# Patient Record
Sex: Male | Born: 1945 | Race: White | Hispanic: No | Marital: Married | Smoking: Never smoker
Health system: Southern US, Community
[De-identification: ages and names within clinical notes are randomized; demographics above are authoritative.]

## PROBLEM LIST (undated history)

## (undated) DIAGNOSIS — K635 Polyp of colon: Secondary | ICD-10-CM

## (undated) DIAGNOSIS — I739 Peripheral vascular disease, unspecified: Secondary | ICD-10-CM

## (undated) DIAGNOSIS — E119 Type 2 diabetes mellitus without complications: Secondary | ICD-10-CM

## (undated) DIAGNOSIS — T7840XA Allergy, unspecified, initial encounter: Secondary | ICD-10-CM

## (undated) DIAGNOSIS — R630 Anorexia: Secondary | ICD-10-CM

## (undated) DIAGNOSIS — G8194 Hemiplegia, unspecified affecting left nondominant side: Secondary | ICD-10-CM

## (undated) DIAGNOSIS — I10 Essential (primary) hypertension: Secondary | ICD-10-CM

## (undated) DIAGNOSIS — I2510 Atherosclerotic heart disease of native coronary artery without angina pectoris: Secondary | ICD-10-CM

## (undated) DIAGNOSIS — I639 Cerebral infarction, unspecified: Secondary | ICD-10-CM

## (undated) DIAGNOSIS — E11319 Type 2 diabetes mellitus with unspecified diabetic retinopathy without macular edema: Secondary | ICD-10-CM

## (undated) DIAGNOSIS — K219 Gastro-esophageal reflux disease without esophagitis: Secondary | ICD-10-CM

## (undated) DIAGNOSIS — E785 Hyperlipidemia, unspecified: Secondary | ICD-10-CM

## (undated) HISTORY — DX: Hyperlipidemia, unspecified: E78.5

## (undated) HISTORY — DX: Cerebral infarction, unspecified: I63.9

## (undated) HISTORY — DX: Polyp of colon: K63.5

## (undated) HISTORY — DX: Gastro-esophageal reflux disease without esophagitis: K21.9

## (undated) HISTORY — DX: Hemiplegia, unspecified affecting left nondominant side: G81.94

## (undated) HISTORY — DX: Anorexia: R63.0

## (undated) HISTORY — DX: Atherosclerotic heart disease of native coronary artery without angina pectoris: I25.10

## (undated) HISTORY — DX: Allergy, unspecified, initial encounter: T78.40XA

## (undated) HISTORY — DX: Peripheral vascular disease, unspecified: I73.9

## (undated) HISTORY — DX: Essential (primary) hypertension: I10

## (undated) HISTORY — PX: OTHER SURGICAL HISTORY: SHX169

## (undated) HISTORY — DX: Type 2 diabetes mellitus with unspecified diabetic retinopathy without macular edema: E11.319

## (undated) HISTORY — DX: Type 2 diabetes mellitus without complications: E11.9

---

## 1999-07-25 HISTORY — PX: OTHER SURGICAL HISTORY: SHX169

## 2007-10-22 ENCOUNTER — Ambulatory Visit (HOSPITAL_COMMUNITY): Admission: RE | Admit: 2007-10-22 | Discharge: 2007-10-22 | Payer: Self-pay | Admitting: Internal Medicine

## 2012-10-24 ENCOUNTER — Non-Acute Institutional Stay: Payer: BC Managed Care – PPO | Admitting: Internal Medicine

## 2012-10-24 DIAGNOSIS — I6990 Unspecified sequelae of unspecified cerebrovascular disease: Secondary | ICD-10-CM

## 2012-10-24 DIAGNOSIS — E7800 Pure hypercholesterolemia, unspecified: Secondary | ICD-10-CM

## 2012-10-24 DIAGNOSIS — I10 Essential (primary) hypertension: Secondary | ICD-10-CM

## 2012-10-24 DIAGNOSIS — E1059 Type 1 diabetes mellitus with other circulatory complications: Secondary | ICD-10-CM

## 2012-10-26 NOTE — Progress Notes (Signed)
PROGRESS NOTE  DATE: 10/24/12  FACILITY: [facility]  LEVEL OF CARE: SNF  Routine Visit  CHIEF COMPLAINT:  Manage diabetes mellitus and hyperlipidemia  HISTORY OF PRESENT ILLNESS:  REASSESSMENT OF ONGOING PROBLEM(S):  1. DM:pt's DM remains stable.  Pt denies polyuria, polydipsia, polyphagia, changes in vision or hypoglycemic episodes.  No complications noted from the medication presently being used.  Last hemoglobin A1c is: 6.7 in 11/13.  2. HYPERLIPIDEMIA: No complications from the medications presently being used. Last fasting lipid panel showed : 11/13 fasting lipid panel normal.  PAST MEDICAL HISTORY : Reviewed.  No changes.  CURRENT MEDICATIONS: Reviewed per [facility]  REVIEW OF SYSTEMS:  GENERAL: no change in appetite, no fatigue, no weight changes, no fever, chills or weakness RESPIRATORY: no cough, SOB, DOE, wheezing, hemoptysis CARDIAC: no chest pain, edema or palpitations GI: no abdominal pain, diarrhea, constipation, heart burn, nausea or vomiting  PHYSICAL EXAMINATION  VS:  T 98.7       P 77      RR 20      BP 116/72     POX %     WT (Lb) 136.8  GENERAL: no acute distress, normal body habitus NECK: supple, trachea midline, no neck masses, no thyroid tenderness, no thyromegaly RESPIRATORY: breathing is even & unlabored, BS CTAB CARDIAC: RRR, no murmur,no extra heart sounds, no edema GI: abdomen soft, normal BS, no masses, no tenderness, no hepatomegaly, no splenomegaly PSYCHIATRIC: the patient is alert & oriented to person, affect & behavior appropriate  LABS/RADIOLOGY:  3/14 hemoglobin 11.4, MCV 83.1 otherwise CBC normal, glucose 123 otherwise BMP normal 11/13 total protein 5.8, albumin 3.1, alkaline phosphatase 119 otherwise he will profile normal  ASSESSMENT/PLAN:  1. diabetes mellitus-well controlled. 2. hyperlipidemia-well controlled. 3. hypertension-well-controlled. 4. history of CVA-stable. 5.  Insomnia-denies ongoing symptoms. 6. vascular  dementia-stable.  CPT CODE: 40981

## 2012-10-31 ENCOUNTER — Non-Acute Institutional Stay: Payer: BC Managed Care – PPO | Admitting: Internal Medicine

## 2012-10-31 DIAGNOSIS — F329 Major depressive disorder, single episode, unspecified: Secondary | ICD-10-CM

## 2012-11-11 ENCOUNTER — Non-Acute Institutional Stay: Payer: BC Managed Care – PPO | Admitting: Adult Health

## 2012-11-11 ENCOUNTER — Encounter: Payer: Self-pay | Admitting: Adult Health

## 2012-11-11 DIAGNOSIS — I6350 Cerebral infarction due to unspecified occlusion or stenosis of unspecified cerebral artery: Secondary | ICD-10-CM

## 2012-11-11 DIAGNOSIS — I10 Essential (primary) hypertension: Secondary | ICD-10-CM

## 2012-11-11 DIAGNOSIS — K219 Gastro-esophageal reflux disease without esophagitis: Secondary | ICD-10-CM | POA: Insufficient documentation

## 2012-11-11 DIAGNOSIS — I639 Cerebral infarction, unspecified: Secondary | ICD-10-CM | POA: Insufficient documentation

## 2012-11-11 DIAGNOSIS — R634 Abnormal weight loss: Secondary | ICD-10-CM

## 2012-11-11 DIAGNOSIS — H8110 Benign paroxysmal vertigo, unspecified ear: Secondary | ICD-10-CM | POA: Insufficient documentation

## 2012-11-11 DIAGNOSIS — E118 Type 2 diabetes mellitus with unspecified complications: Secondary | ICD-10-CM | POA: Insufficient documentation

## 2012-11-11 DIAGNOSIS — G4700 Insomnia, unspecified: Secondary | ICD-10-CM | POA: Insufficient documentation

## 2012-11-11 DIAGNOSIS — E785 Hyperlipidemia, unspecified: Secondary | ICD-10-CM

## 2012-11-11 DIAGNOSIS — K5900 Constipation, unspecified: Secondary | ICD-10-CM | POA: Insufficient documentation

## 2012-11-11 MED ORDER — MEGESTROL ACETATE 40 MG/ML PO SUSP: 400.0000 mg | Freq: Every day | ORAL | Status: DC

## 2012-11-11 NOTE — Progress Notes (Signed)
Patient ID: Jeremiah Baldwin, male   DOB: Sep 16, 1945, 67 y.o.   MRN: [id number]  Chief Complaint  Patient presents with  . Acute Visit    weight loss    HPI:  GERD (gastroesophageal reflux disease) Is stable is taking protonix 40 mg daily   Essential hypertension, benign Is stable is taking vasotec 5 mg daily   Insomnia Is stable is taking trazodone 75 mg nightly   Other and unspecified hyperlipidemia Is stable is taking lipitor 10 mg daily   CVA (cerebral vascular accident) Without change in status is taking plavix 75 mg daily   Unspecified constipation Is taking colace 200 mg three times daily   Benign paroxysmal positional vertigo Is stable is taking meclizine 25 mg three times daily   Loss of weight His appetite remains poor his remeron was reduced to 7.5 mg nightly at this time his weight is at 134 pounds down from 140 pounds. He does not participate in the hpi or ros at times and at times will decline weights.    Past Medical History  Diagnosis Date  . Allergy   . Anorexia   . Hypertension   . Hyperlipidemia   . Diabetes mellitus without complication   . CAD (coronary artery disease)   . Colon polyp   . Stroke   . Left hemiparesis   . GERD (gastroesophageal reflux disease)   . PVD (peripheral vascular disease)   . Diabetic retinopathy     Past Surgical History  Procedure Laterality Date  . Left bka    . Enteroscopic laser photocoagulation Left 2001    VITAL SIGNS BP 116/72  Pulse 60  Ht 6\' 1"  (1.854 m)  Wt 134 lb (60.782 kg)  BMI 17.68 kg/m2   Patient's Medications  New Prescriptions   No medications on file  Previous Medications   ATORVASTATIN (LIPITOR) 10 MG TABLET    Take 10 mg by mouth daily.   CLOPIDOGREL (PLAVIX) 75 MG TABLET    Take 75 mg by mouth daily.   DOCUSATE SODIUM (COLACE) 100 MG CAPSULE    Take 300 mg by mouth 3 (three) times daily.   ENALAPRIL (VASOTEC) 5 MG TABLET    Take 5 mg by mouth daily.   HYDROCODONE-ACETAMINOPHEN  (NORCO/VICODIN) 5-325 MG PER TABLET    Take 1 tablet by mouth every 4 (four) hours as needed for pain.   INSULIN DETEMIR (LEVEMIR) 100 UNIT/ML INJECTION    Inject 10 Units into the skin daily.   LORAZEPAM (ATIVAN) 0.5 MG TABLET    Take 0.5 mg by mouth daily as needed for anxiety (prior to dental appointments).   MECLIZINE (ANTIVERT) 25 MG TABLET    Take 25 mg by mouth 3 (three) times daily.   MIRTAZAPINE (REMERON) 7.5 MG TABLET    Take 7.5 mg by mouth at bedtime.   OLOPATADINE HCL (PATADAY) 0.2 % SOLN    Apply 1 drop to eye daily. Each eye   PANTOPRAZOLE (PROTONIX) 40 MG TABLET    Take 40 mg by mouth daily.   TRAZODONE (DESYREL) 50 MG TABLET    Take 75 mg by mouth at bedtime.  Modified Medications   No medications on file  Discontinued Medications   No medications on file    SIGNIFICANT DIAGNOSTIC EXAMS   LAB REVIEWED: 06-07-12: wbc 8.8;hgb 12.4; hct 35.5; mcv 82.8; plt 234; glucose 134; bun 14; creat 0.55; k+ 3.4 Na++ 138; liver normal albumin 3.1; chol 108; ldl 67; trig 62  06-14-12: hgb a1c 6.7  10-11-12: wbc 6.7; hgb 11.4; hct 34.0; mcv 83.1; plt 25; glucose 123; bun 27; creat 12; k+ 4.1 Na++140  Review of Systems  Unable to perform ROS    Physical Exam  Constitutional:  Thin   Neck: Neck supple.  Cardiovascular: Normal rate and regular rhythm.   +right pp  Respiratory: Effort normal and breath sounds normal.  GI: Soft. Bowel sounds are normal.  Musculoskeletal: Normal range of motion.  Left bka  Neurological: He is alert.  Skin: Skin is warm and dry.       ASSESSMENT/ PLAN:  Will stop the lipitor at this time due to his change in appetite; will begin megace 400 mg daily prior to lunch for 30 days and will continue to monitor his status

## 2012-11-11 NOTE — Assessment & Plan Note (Signed)
His appetite remains poor his remeron was reduced to 7.5 mg nightly at this time his weight is at 134 pounds down from 140 pounds. He does not participate in the hpi or ros at times and at times will decline weights.

## 2012-11-11 NOTE — Assessment & Plan Note (Signed)
Is stable is taking vasotec 5 mg daily

## 2012-11-11 NOTE — Assessment & Plan Note (Signed)
Is taking colace 200 mg three times daily

## 2012-11-11 NOTE — Assessment & Plan Note (Signed)
Without change in status is taking plavix 75 mg daily

## 2012-11-11 NOTE — Assessment & Plan Note (Signed)
Is stable is taking trazodone 75 mg nightly

## 2012-11-11 NOTE — Assessment & Plan Note (Signed)
Is stable is taking meclizine 25 mg three times daily

## 2012-11-11 NOTE — Assessment & Plan Note (Signed)
Is stable is taking protonix 40 mg daily

## 2012-11-11 NOTE — Assessment & Plan Note (Signed)
Is stable is taking lipitor 10 mg daily

## 2012-11-19 NOTE — Progress Notes (Signed)
Patient ID: Jeremiah Baldwin, male   DOB: March 08, 1946, 67 y.o.   MRN: [id number]        PROGRESS NOTE  DATE:  10/31/2012  FACILITY: [facility]   LEVEL OF CARE: SNF  Acute Visit  CHIEF COMPLAINT:  Manage depression.  HISTORY OF PRESENT ILLNESS: I was requested by the staff to assess the patient regarding above problem(s):  DEPRESSION: The depression remains stable. Patient denies ongoing feelings of sadness, insomnia, anedhonia or lack of appetite. No complications reported from the medications currently being used. Staff do not report behavioral problems.  I was requested by the pharmacy consultant to assess the patient for possible dose reduction of Remeron.  Patient denies ongoing depressive symptoms.    PAST MEDICAL HISTORY : Reviewed.  No changes.  CURRENT MEDICATIONS: Reviewed per [facility]  PHYSICAL EXAMINATION  GENERAL: no acute distress, normal body habitus RESPIRATORY: breathing is even & unlabored, BS CTAB CARDIAC: RRR, no murmur,no extra heart sounds, no edema PSYCHIATRIC: the patient is alert & oriented to person, affect & behavior appropriate  ASSESSMENT/PLAN:  Depression.  Stable problem.  Decrease Remeron to 7.5 mg q.h.s. and observe.    CPT CODE: 78469

## 2012-11-28 ENCOUNTER — Non-Acute Institutional Stay: Payer: BC Managed Care – PPO | Admitting: Internal Medicine

## 2012-11-28 DIAGNOSIS — I10 Essential (primary) hypertension: Secondary | ICD-10-CM

## 2012-11-28 DIAGNOSIS — E1059 Type 1 diabetes mellitus with other circulatory complications: Secondary | ICD-10-CM

## 2012-11-28 DIAGNOSIS — I6990 Unspecified sequelae of unspecified cerebrovascular disease: Secondary | ICD-10-CM

## 2012-11-28 DIAGNOSIS — E7800 Pure hypercholesterolemia, unspecified: Secondary | ICD-10-CM

## 2012-11-29 DIAGNOSIS — E7800 Pure hypercholesterolemia, unspecified: Secondary | ICD-10-CM | POA: Insufficient documentation

## 2012-11-29 DIAGNOSIS — I69959 Hemiplegia and hemiparesis following unspecified cerebrovascular disease affecting unspecified side: Secondary | ICD-10-CM | POA: Insufficient documentation

## 2012-11-29 DIAGNOSIS — E1051 Type 1 diabetes mellitus with diabetic peripheral angiopathy without gangrene: Secondary | ICD-10-CM | POA: Insufficient documentation

## 2012-11-29 NOTE — Progress Notes (Signed)
PROGRESS NOTE  DATE: 11/28/12  FACILITY: [facility]  LEVEL OF CARE: SNF  Routine Visit  CHIEF COMPLAINT:  Manage diabetes mellitus and hyperlipidemia  HISTORY OF PRESENT ILLNESS:  REASSESSMENT OF ONGOING PROBLEM(S):  1. DM:pt's DM remains stable.  Pt denies polyuria, polydipsia, polyphagia, changes in vision or hypoglycemic episodes.  No complications noted from the medication presently being used.  Last hemoglobin A1c is: 6.7 in 11/13.  2. HYPERLIPIDEMIA: No complications from the medications presently being used. Last fasting lipid panel showed : 11/13 fasting lipid panel normal.  PAST MEDICAL HISTORY : Reviewed.  No changes.  CURRENT MEDICATIONS: Reviewed per [facility]  REVIEW OF SYSTEMS:  GENERAL: no change in appetite, no fatigue, no weight changes, no fever, chills or weakness RESPIRATORY: no cough, SOB, DOE, wheezing, hemoptysis CARDIAC: no chest pain, edema or palpitations GI: no abdominal pain, diarrhea, constipation, heart burn, nausea or vomiting  PHYSICAL EXAMINATION  VS:  T 98       P 87      RR 17     BP 116/74     POX %     WT (Lb) 134  GENERAL: no acute distress, normal body habitus NECK: supple, trachea midline, no neck masses, no thyroid tenderness, no thyromegaly RESPIRATORY: breathing is even & unlabored, BS CTAB CARDIAC: RRR, no murmur,no extra heart sounds, no edema GI: abdomen soft, normal BS, no masses, no tenderness, no hepatomegaly, no splenomegaly PSYCHIATRIC: the patient is alert & oriented to person, affect & behavior appropriate  LABS/RADIOLOGY:  3/14 hemoglobin 11.4, MCV 83.1 otherwise CBC normal, glucose 123 otherwise BMP normal 11/13 total protein 5.8, albumin 3.1, alkaline phosphatase 119 otherwise he will profile normal  ASSESSMENT/PLAN:  1. diabetes mellitus-well controlled. Check hemoglobin A1c. 2. hyperlipidemia-well controlled. Check fasting lipid panel. 3. hypertension-well-controlled. 4. history of CVA-stable. 5.   Insomnia-denies ongoing symptoms. 6. vascular dementia-stable. 7. check liver profile.  CPT CODE: 40981

## 2012-12-24 ENCOUNTER — Non-Acute Institutional Stay: Payer: BC Managed Care – PPO | Admitting: Internal Medicine

## 2012-12-24 DIAGNOSIS — I639 Cerebral infarction, unspecified: Secondary | ICD-10-CM

## 2012-12-24 DIAGNOSIS — I10 Essential (primary) hypertension: Secondary | ICD-10-CM

## 2012-12-24 DIAGNOSIS — E1059 Type 1 diabetes mellitus with other circulatory complications: Secondary | ICD-10-CM

## 2012-12-24 DIAGNOSIS — I6350 Cerebral infarction due to unspecified occlusion or stenosis of unspecified cerebral artery: Secondary | ICD-10-CM

## 2012-12-24 DIAGNOSIS — E7800 Pure hypercholesterolemia, unspecified: Secondary | ICD-10-CM

## 2012-12-26 NOTE — Progress Notes (Signed)
PROGRESS NOTE  DATE: 12/24/12  FACILITY: [facility]  LEVEL OF CARE: SNF  Routine Visit  CHIEF COMPLAINT:  Manage diabetes mellitus and hyperlipidemia  HISTORY OF PRESENT ILLNESS:  REASSESSMENT OF ONGOING PROBLEM(S):  1. DM:pt's DM remains stable.  Pt denies polyuria, polydipsia, polyphagia, changes in vision or hypoglycemic episodes.  No complications noted from the medication presently being used.  Last hemoglobin A1c is: 6.7 in 11/13, in 5/14 hemoglobin A1c 6.2.  2. HYPERLIPIDEMIA: No complications from the medications presently being used. Last fasting lipid panel showed : 11/13 fasting lipid panel normal, in 5/14 HDL 27 otherwise fasting lipid panel normal.  PAST MEDICAL HISTORY : Reviewed.  No changes.  CURRENT MEDICATIONS: Reviewed per [facility]  REVIEW OF SYSTEMS:  GENERAL: no change in appetite, no fatigue, no weight changes, no fever, chills or weakness RESPIRATORY: no cough, SOB, DOE, wheezing, hemoptysis CARDIAC: no chest pain, edema or palpitations GI: no abdominal pain, diarrhea, constipation, heart burn, nausea or vomiting  PHYSICAL EXAMINATION  VS:  T 98.2       P 82      RR 18     BP 116/74     POX %     WT (Lb) 141  GENERAL: no acute distress, thin body habitus NECK: supple, trachea midline, no neck masses, no thyroid tenderness, no thyromegaly RESPIRATORY: breathing is even & unlabored, BS CTAB CARDIAC: RRR, no murmur,no extra heart sounds, no edema GI: abdomen soft, normal BS, no masses, no tenderness, no hepatomegaly, no splenomegaly PSYCHIATRIC: the patient is alert & oriented to person, affect & behavior appropriate  LABS/RADIOLOGY:  5/14 albumin 3.4, total protein 5.8 otherwise liver profile normal  3/14 hemoglobin 11.4, MCV 83.1 otherwise CBC normal, glucose 123 otherwise BMP normal 11/13 total protein 5.8, albumin 3.1, alkaline phosphatase 119 otherwise he will profile normal  ASSESSMENT/PLAN:  1. diabetes mellitus-well controlled.  2.  hyperlipidemia-well controlled.  3. hypertension-well-controlled. 4. history of CVA-stable. 5.  Insomnia-denies ongoing symptoms. 6. vascular dementia-stable.  CPT CODE: 16109

## 2013-01-28 ENCOUNTER — Non-Acute Institutional Stay: Payer: BC Managed Care – PPO | Admitting: Internal Medicine

## 2013-01-28 DIAGNOSIS — R339 Retention of urine, unspecified: Secondary | ICD-10-CM

## 2013-01-28 DIAGNOSIS — G4700 Insomnia, unspecified: Secondary | ICD-10-CM

## 2013-01-28 DIAGNOSIS — R140 Abdominal distension (gaseous): Secondary | ICD-10-CM

## 2013-01-28 DIAGNOSIS — R141 Gas pain: Secondary | ICD-10-CM

## 2013-02-04 ENCOUNTER — Non-Acute Institutional Stay: Payer: BC Managed Care – PPO | Admitting: Internal Medicine

## 2013-02-04 DIAGNOSIS — N390 Urinary tract infection, site not specified: Secondary | ICD-10-CM

## 2013-02-05 ENCOUNTER — Non-Acute Institutional Stay: Payer: BC Managed Care – PPO | Admitting: Nurse Practitioner

## 2013-02-05 DIAGNOSIS — N390 Urinary tract infection, site not specified: Secondary | ICD-10-CM

## 2013-02-05 DIAGNOSIS — I639 Cerebral infarction, unspecified: Secondary | ICD-10-CM

## 2013-02-05 DIAGNOSIS — I6350 Cerebral infarction due to unspecified occlusion or stenosis of unspecified cerebral artery: Secondary | ICD-10-CM

## 2013-02-05 DIAGNOSIS — R634 Abnormal weight loss: Secondary | ICD-10-CM

## 2013-02-05 DIAGNOSIS — I10 Essential (primary) hypertension: Secondary | ICD-10-CM

## 2013-02-05 DIAGNOSIS — K5900 Constipation, unspecified: Secondary | ICD-10-CM

## 2013-02-05 NOTE — Progress Notes (Signed)
Patient ID: Jeremiah Baldwin, male   DOB: [birth year], 67 y.o.   MRN: [id number]  Nursing Home Location:  [facility]   Place of Service: SNF [phone])  Chief Complaint  Patient presents with  . Medical Managment of Chronic Issues    HPI:  67 year old male who is a long term resident of [facility] is seen today for routine follow up Pt with a PMH of GERD, HTN, Insomnia, hyperlipids, CVA, constipation, and weight loss Review of ongoing issues:  GERD (gastroesophageal reflux disease)  taking protonix 40 mg daily - stable  Essential hypertension, benign  taking vasotec 5 mg daily - stable Insomnia  taking trazodone 75 mg nightly - stable Other and unspecified hyperlipidemia  Currently taking lipitor 10 mg daily CVA (cerebral vascular accident)  Without change in status is taking plavix 75 mg daily  Unspecified constipation  Is taking colace 200 mg three times daily - without complaints of constipation Loss of weight  His appetite remains poor--  his remeron was reduced to 7.5 mg nightly at this time his weight is at 143 lbs   Pt with recent UTI- started on ampicillin for 1 week on day 2/7 - tolerating medication without side effects. Nursing without concerns at this time.  Review of Systems:  Limited ROS due to pt not willing to answer questions except with minimal communication Review of Systems  Respiratory: Negative for shortness of breath.   Cardiovascular: Negative for chest pain and palpitations.  Gastrointestinal: Negative for abdominal pain, diarrhea and constipation.  Genitourinary: Negative for dysuria.  Musculoskeletal: Negative for myalgias.  Neurological: Negative for dizziness and headaches.     Medications: Patient's Medications  New Prescriptions   No medications on file  Previous Medications   CLOPIDOGREL (PLAVIX) 75 MG TABLET    Take 75 mg by mouth daily.   DOCUSATE SODIUM (COLACE) 100 MG CAPSULE    Take 300 mg by mouth 3 (three) times  daily.   ENALAPRIL (VASOTEC) 5 MG TABLET    Take 5 mg by mouth daily.   HYDROCODONE-ACETAMINOPHEN (NORCO/VICODIN) 5-325 MG PER TABLET    Take 1 tablet by mouth every 4 (four) hours as needed for pain.   INSULIN DETEMIR (LEVEMIR) 100 UNIT/ML INJECTION    Inject 10 Units into the skin daily.   LORAZEPAM (ATIVAN) 0.5 MG TABLET    Take 0.5 mg by mouth daily as needed for anxiety (prior to dental appointments).   MECLIZINE (ANTIVERT) 25 MG TABLET    Take 25 mg by mouth 3 (three) times daily.   MEGESTROL (MEGACE ORAL) 40 MG/ML SUSPENSION    Take 10 mLs (400 mg total) by mouth daily. Take daily prior to lunch   MIRTAZAPINE (REMERON) 7.5 MG TABLET    Take 7.5 mg by mouth at bedtime.   OLOPATADINE HCL (PATADAY) 0.2 % SOLN    Apply 1 drop to eye daily. Each eye   PANTOPRAZOLE (PROTONIX) 40 MG TABLET    Take 40 mg by mouth daily.   SERTRALINE (ZOLOFT) 50 MG TABLET    Take 50 mg by mouth daily.   TRAZODONE (DESYREL) 50 MG TABLET    Take 100 mg by mouth at bedtime.   Modified Medications   No medications on file  Discontinued Medications   No medications on file     Physical Exam:  Filed Vitals:   02/05/13 1152  BP: 127/78  Pulse: 74  Resp: 18  Weight: 143 lb 6.4 oz (65.046 kg)  Physical Exam  Constitutional: No distress.  HENT:  Head: Normocephalic and atraumatic.  Mouth/Throat: No oropharyngeal exudate.  Eyes: Conjunctivae and EOM are normal. Pupils are equal, round, and reactive to light.  Neck: Normal range of motion. Neck supple.  Cardiovascular: Normal rate, regular rhythm and normal heart sounds.   Pulmonary/Chest: Effort normal and breath sounds normal. No respiratory distress.  Abdominal: Soft. Bowel sounds are normal. He exhibits no distension.  Musculoskeletal:  L AKA   Neurological: He is alert.  Left hemiparesis   Skin: Skin is warm and dry. He is not diaphoretic.      Labs reviewed/Significant Diagnostic Results: 5.15.14: cholesterol 96, triglyceride 74, HDL 27,  LDL 54 AST 14,ALT 21, total protein 5.8, albumin 3.4 A1C 6.2     Assessment/Plan  Unspecified constipation -stable  Essential hypertension, benign- stable on current medications   CVA (cerebral vascular accident) stable; cont current medications      UTI- stable- cont antibiotics  Weight loss- was started on megace and now pt weight is up to 143 lbs will cont to monitor weights  Labs/tests ordered Cbc and bmp

## 2013-02-23 NOTE — Progress Notes (Signed)
Patient ID: Jeremiah Baldwin, male   DOB: [birth year], 67 y.o.   MRN: [id number]        PROGRESS NOTE  DATE: 01/28/2013  FACILITY:  [facility]  LEVEL OF CARE: SNF (31)  Acute Visit  CHIEF COMPLAINT:  Manage abdominal distention, urinary retention, and insomnia.    HISTORY OF PRESENT ILLNESS: I was requested by the staff to assess the patient regarding above problem(s):  ABDOMINAL DISTENTION:  Staff report that patient's abdomen is acutely distended and patient is complaining of lower abdominal pain and has not urinated for two nights.  He denies nausea or vomiting.    INSOMNIA: The insomnia is unstable.  Patient complains of not sleeping for the past two nights.  No complications noted from the medications presently being used. Patient denies hallucinations, delusions.    PAST MEDICAL HISTORY : Reviewed.  No changes.  CURRENT MEDICATIONS: Reviewed per [facility]  REVIEW OF SYSTEMS:  GENERAL: no change in appetite, no fatigue, no weight changes, no fever, chills or weakness RESPIRATORY: no cough, SOB, DOE,, wheezing, hemoptysis CARDIAC: no chest pain, edema or palpitations GI: no diarrhea, constipation, heart burn, nausea or vomiting; abdomen is distended; pain in the lower abdomen  PHYSICAL EXAMINATION  VS:  T 96.8       P 102      RR 20      BP 122/72     POX %       WT (Lb)  GENERAL: no acute distress, normal body habitus EYES: conjunctivae normal, sclerae normal, normal eye lids NECK: supple, trachea midline, no neck masses, no thyroid tenderness, no thyromegaly LYMPHATICS: no LAN in the neck, no supraclavicular LAN RESPIRATORY: breathing is even & unlabored, BS CTAB CARDIAC: RRR, no murmur,no extra heart sounds, no edema GI: abdomen soft, distended, bowel sounds diminished, no masses, no hepatomegaly, no splenomegaly; lower abdomen is firm and tender to palpation; there is some guarding but no peritoneal signs PSYCHIATRIC: the patient is alert & oriented to  person, affect & behavior appropriate  ASSESSMENT/PLAN:  Abdominal distention and urinary retention.  New onset.  Significant problem.  We will do a stat in-and-out cath.  Send urine for culture and sensitivities.  Also obtain stat lower abdominal ultrasound.   Insomnia.  Uncontrolled problem.  Increase trazodone to 100 q.h.s.   CPT CODE: 95621

## 2013-02-27 DIAGNOSIS — N390 Urinary tract infection, site not specified: Secondary | ICD-10-CM | POA: Insufficient documentation

## 2013-02-27 NOTE — Progress Notes (Signed)
Patient ID: Jeremiah Baldwin, male   DOB: Feb 15, 1946, 67 y.o.   MRN: [id number]        PROGRESS NOTE  DATE: 02/04/2013  FACILITY:  [facility]  LEVEL OF CARE: SNF (31)  Acute Visit  CHIEF COMPLAINT:  Manage UTI.    HISTORY OF PRESENT ILLNESS: I was requested by the staff to assess the patient regarding above problem(s):  UTI: On 02/03/2013, patient's urinalysis showed cloudy appearance, large leukocyte esterase, negative nitrite, WBC greater than 50, RBC 0-2.  A urinalysis was done due to urinary retention.  Urine culture grows Enterococcus species.  The UTI remains stable.  The patient denies ongoing suprapubic pain, flank pain, dysuria, urinary frequency, urinary hesitancy or hematuria.  No complications reported from the current antibiotic being used.   PAST MEDICAL HISTORY : Reviewed.  No changes.  CURRENT MEDICATIONS: Reviewed per [facility]  REVIEW OF SYSTEMS:  GENERAL: no change in appetite, no fatigue, no weight changes, no fever, chills or weakness RESPIRATORY: no cough, SOB, DOE,, wheezing, hemoptysis CARDIAC: no chest pain, edema or palpitations GI: no abdominal pain, diarrhea, constipation, heart burn, nausea or vomiting  PHYSICAL EXAMINATION  GENERAL: no acute distress, thin body habitus NECK: supple, trachea midline, no neck masses, no thyroid tenderness, no thyromegaly RESPIRATORY: breathing is even & unlabored, BS CTAB CARDIAC: RRR, no murmur,no extra heart sounds, no edema GI: abdomen soft, normal BS, no masses, no tenderness, no hepatomegaly, no splenomegaly PSYCHIATRIC: the patient is alert & oriented to person, affect & behavior appropriate  ASSESSMENT/PLAN:  UTI.  New problem.  Ampicillin 500 mg t.i.d. for one week and Florastor b.i.d. for one week started.    CPT CODE: 40981

## 2013-03-11 ENCOUNTER — Non-Acute Institutional Stay: Payer: BC Managed Care – PPO | Admitting: Internal Medicine

## 2013-03-11 DIAGNOSIS — E1059 Type 1 diabetes mellitus with other circulatory complications: Secondary | ICD-10-CM

## 2013-03-11 DIAGNOSIS — I10 Essential (primary) hypertension: Secondary | ICD-10-CM

## 2013-03-11 DIAGNOSIS — E7800 Pure hypercholesterolemia, unspecified: Secondary | ICD-10-CM

## 2013-03-11 DIAGNOSIS — L299 Pruritus, unspecified: Secondary | ICD-10-CM

## 2013-03-11 NOTE — Progress Notes (Signed)
PROGRESS NOTE  DATE: 03/11/13  FACILITY: [facility]  LEVEL OF CARE: SNF  Routine Visit  CHIEF COMPLAINT:  Manage itching, diabetes mellitus and hyperlipidemia  HISTORY OF PRESENT ILLNESS:  REASSESSMENT OF ONGOING PROBLEM(S):  DM:pt's DM remains stable.  staff deny polyuria, polydipsia, polyphagia, changes in vision or hypoglycemic episodes.  No complications noted from the medication presently being used.  Last hemoglobin A1c is: 6.7 in 11/13, in 5/14 hemoglobin A1c 6.2.  HYPERLIPIDEMIA: No complications from the medications presently being used. Last fasting lipid panel showed : 11/13 fasting lipid panel normal, in 5/14 HDL 27 otherwise fasting lipid panel normal.  ITCHING: new problem.  Treatment nurse reports that pt is frequently itching which causes numerous skin tears.  Pt is a poor historian today.  Staff cannot identify alleviating or exacerbating factors, there is no temporal relationship.  PAST MEDICAL HISTORY : Reviewed.  No changes.  CURRENT MEDICATIONS: Reviewed per [facility]  REVIEW OF SYSTEMS:unobtainable due to pt being a poor historian  PHYSICAL EXAMINATION  VS:  T 98.3       P 88      RR 20     BP 127/78     POX %     WT (Lb) 144  GENERAL: no acute distress, thin body habitus SKIN: multiple skin tears EYES: unable to assess NECK: supple, trachea midline, no neck masses, no thyroid tenderness, no thyromegaly LYMPHATICS: no cervical LAN, no supraclavicular LAN RESPIRATORY: breathing is even & unlabored, BS CTAB CARDIAC: RRR, no murmur,no extra heart sounds, no edema GI: abdomen soft, normal BS, no masses, no tenderness, no hepatomegaly, no splenomegaly PSYCHIATRIC: the patient is alert & oriented to person, affect & behavior appropriate  LABS/RADIOLOGY:  7/14 Hb 11.2, mcv87 ow cbc nl, glc 163 ow bmp nl  5/14 albumin 3.4, total protein 5.8 otherwise liver profile normal  3/14 hemoglobin 11.4, MCV 83.1 otherwise CBC normal, glucose 123 otherwise BMP  normal 11/13 total protein 5.8, albumin 3.1, alkaline phosphatase 119 otherwise he will profile normal  ASSESSMENT/PLAN:  Pruritus-new problem.  Start hydroxyzine 25mg  q6 prn. diabetes mellitus-well controlled.  hyperlipidemia-well controlled.  hypertension-well-controlled. history of CVA-stable. Insomnia-denies ongoing symptoms. vascular dementia-stable.  CPT CODE: 78295

## 2013-03-18 ENCOUNTER — Non-Acute Institutional Stay: Payer: BC Managed Care – PPO | Admitting: Internal Medicine

## 2013-03-18 DIAGNOSIS — F329 Major depressive disorder, single episode, unspecified: Secondary | ICD-10-CM

## 2013-04-02 ENCOUNTER — Non-Acute Institutional Stay: Payer: BC Managed Care – PPO | Admitting: Internal Medicine

## 2013-04-02 DIAGNOSIS — E1059 Type 1 diabetes mellitus with other circulatory complications: Secondary | ICD-10-CM

## 2013-04-02 DIAGNOSIS — E785 Hyperlipidemia, unspecified: Secondary | ICD-10-CM

## 2013-04-02 DIAGNOSIS — I10 Essential (primary) hypertension: Secondary | ICD-10-CM

## 2013-04-02 DIAGNOSIS — I639 Cerebral infarction, unspecified: Secondary | ICD-10-CM

## 2013-04-02 DIAGNOSIS — I6350 Cerebral infarction due to unspecified occlusion or stenosis of unspecified cerebral artery: Secondary | ICD-10-CM

## 2013-04-02 NOTE — Progress Notes (Signed)
PROGRESS NOTE  DATE: 04/02/13  FACILITY: [facility]  LEVEL OF CARE: SNF  Routine Visit  CHIEF COMPLAINT:  Manage diabetes mellitus and hyperlipidemia  HISTORY OF PRESENT ILLNESS:  REASSESSMENT OF ONGOING PROBLEM(S):  DM:pt's DM remains stable.  staff deny polyuria, polydipsia, polyphagia, changes in vision or hypoglycemic episodes.  No complications noted from the medication presently being used.  Last hemoglobin A1c is: 6.7 in 11/13, in 5/14 hemoglobin A1c 6.2.  HYPERLIPIDEMIA: No complications from the medications presently being used. Last fasting lipid panel showed : 11/13 fasting lipid panel normal, in 5/14 HDL 27 otherwise fasting lipid panel normal.  PAST MEDICAL HISTORY : Reviewed.  No changes.  CURRENT MEDICATIONS: Reviewed per [facility]  REVIEW OF SYSTEMS:unobtainable due to pt being a poor historian  PHYSICAL EXAMINATION  VS:  T 97.6     P 60      RR 16     BP 116/80     POX %     WT (Lb) 147.2  GENERAL: no acute distress, thin body habitus NECK: supple, trachea midline, no neck masses, no thyroid tenderness, no thyromegaly RESPIRATORY: breathing is even & unlabored, BS CTAB CARDIAC: RRR, no murmur,no extra heart sounds, no edema GI: abdomen soft, normal BS, no masses, no tenderness, no hepatomegaly, no splenomegaly PSYCHIATRIC: the patient is alert & oriented to person, affect & behavior appropriate  LABS/RADIOLOGY:  7/14 Hb 11.2, mcv87 ow cbc nl, glc 163 ow bmp nl  5/14 albumin 3.4, total protein 5.8 otherwise liver profile normal  3/14 hemoglobin 11.4, MCV 83.1 otherwise CBC normal, glucose 123 otherwise BMP normal 11/13 total protein 5.8, albumin 3.1, alkaline phosphatase 119 otherwise he will profile normal  ASSESSMENT/PLAN:  diabetes mellitus-well controlled.  hyperlipidemia-well controlled.  hypertension-well-controlled. history of CVA-stable. Insomnia-denies ongoing symptoms. vascular dementia-stable.  CPT CODE: 81191

## 2013-04-17 DIAGNOSIS — F32A Depression, unspecified: Secondary | ICD-10-CM | POA: Insufficient documentation

## 2013-04-17 DIAGNOSIS — F329 Major depressive disorder, single episode, unspecified: Secondary | ICD-10-CM | POA: Insufficient documentation

## 2013-04-17 NOTE — Progress Notes (Signed)
Patient ID: Jeremiah Baldwin, male   DOB: 04/24/1946, 67 y.o.   MRN: [id number]        PROGRESS NOTE  DATE: 03/18/2013  FACILITY:  [facility]  LEVEL OF CARE: SNF (31)  Acute Visit  CHIEF COMPLAINT:  Manage depression.    HISTORY OF PRESENT ILLNESS: I was requested by the staff to assess the patient regarding above problem(s):  DEPRESSION: I was requested by the pharmacy consultant to assess the patient for possible discontinuation of one antidepressant since patient is on two which are Remeron and Zoloft, which is duplication of medication.  The depression remains stable. Patient denies ongoing feelings of sadness, insomnia, anedhonia or lack of appetite. No complications reported from the medications currently being used. Staff do not report behavioral problems.    PAST MEDICAL HISTORY : Reviewed.  No changes.  CURRENT MEDICATIONS: Reviewed per [facility]  PHYSICAL EXAMINATION  GENERAL: no acute distress, normal body habitus CARDIAC: RRR, no murmur,no extra heart sounds, no edema PSYCHIATRIC: the patient is alert & oriented to person, affect & behavior appropriate  ASSESSMENT/PLAN:  Depression.  Stable.  Taper off Remeron and keep the Zoloft.    CPT CODE: 95284

## 2013-04-29 ENCOUNTER — Non-Acute Institutional Stay: Payer: BC Managed Care – PPO | Admitting: Internal Medicine

## 2013-04-29 DIAGNOSIS — I639 Cerebral infarction, unspecified: Secondary | ICD-10-CM

## 2013-04-29 DIAGNOSIS — E1059 Type 1 diabetes mellitus with other circulatory complications: Secondary | ICD-10-CM

## 2013-04-29 DIAGNOSIS — I6350 Cerebral infarction due to unspecified occlusion or stenosis of unspecified cerebral artery: Secondary | ICD-10-CM

## 2013-04-29 DIAGNOSIS — I10 Essential (primary) hypertension: Secondary | ICD-10-CM

## 2013-04-29 DIAGNOSIS — E7800 Pure hypercholesterolemia, unspecified: Secondary | ICD-10-CM

## 2013-05-09 NOTE — Progress Notes (Signed)
PROGRESS NOTE  DATE: 04/29/13  FACILITY: [facility]  LEVEL OF CARE: SNF  Routine Visit  CHIEF COMPLAINT:  Manage diabetes mellitus and hyperlipidemia  HISTORY OF PRESENT ILLNESS:  REASSESSMENT OF ONGOING PROBLEM(S):  DM:pt's DM remains stable.  staff deny polyuria, polydipsia, polyphagia, changes in vision or hypoglycemic episodes.  No complications noted from the medication presently being used.  Last hemoglobin A1c is: 6.7 in 11/13, in 5/14 hemoglobin A1c 6.2.  HYPERLIPIDEMIA: No complications from the medications presently being used. Last fasting lipid panel showed : 11/13 fasting lipid panel normal, in 5/14 HDL 27 otherwise fasting lipid panel normal.  PAST MEDICAL HISTORY : Reviewed.  No changes.  CURRENT MEDICATIONS: Reviewed per [facility]  REVIEW OF SYSTEMS:unobtainable due to pt being a poor historian  PHYSICAL EXAMINATION  VS:  T 97.6     P 68      RR 20     BP 116/80     POX %     WT (Lb) 147.2  GENERAL: no acute distress, thin body habitus NECK: supple, trachea midline, no neck masses, no thyroid tenderness, no thyromegaly RESPIRATORY: breathing is even & unlabored, BS CTAB CARDIAC: RRR, no murmur,no extra heart sounds, no edema GI: abdomen soft, normal BS, no masses, no tenderness, no hepatomegaly, no splenomegaly PSYCHIATRIC: the patient is alert & oriented to person, affect & behavior appropriate  LABS/RADIOLOGY:  7/14 Hb 11.2, mcv87 ow cbc nl, glc 163 ow bmp nl  5/14 albumin 3.4, total protein 5.8 otherwise liver profile normal  3/14 hemoglobin 11.4, MCV 83.1 otherwise CBC normal, glucose 123 otherwise BMP normal 11/13 total protein 5.8, albumin 3.1, alkaline phosphatase 119 otherwise he will profile normal  ASSESSMENT/PLAN:  diabetes mellitus-well controlled.  hyperlipidemia-well controlled.  hypertension-well-controlled. history of CVA-stable. Insomnia-denies ongoing symptoms. vascular dementia-stable.  CPT CODE: 96045

## 2013-05-19 ENCOUNTER — Non-Acute Institutional Stay: Payer: BC Managed Care – PPO | Admitting: Internal Medicine

## 2013-05-19 DIAGNOSIS — G4700 Insomnia, unspecified: Secondary | ICD-10-CM

## 2013-05-19 NOTE — Progress Notes (Signed)
PROGRESS NOTE  DATE: 05/19/2013  FACILITY:  [facility]  LEVEL OF CARE: SNF (31)  Acute Visit  CHIEF COMPLAINT:  Manage insomnia  HISTORY OF PRESENT ILLNESS: I was requested by the staff to assess the patient regarding above problem(s):  INSOMNIA: The insomnia is unstable.  No complications noted from the medications presently being used. Patient c/o ongoing insomnia.  Denies pain, hallucinations, delusions. I was requested by the pharmacy consultant to assess the patient for a possible dose reduction of trazodone.  PAST MEDICAL HISTORY : Reviewed.  No changes.  CURRENT MEDICATIONS: Reviewed per [facility]  PHYSICAL EXAMINATION  GENERAL: no acute distress, normal body habitus CARDIAC: RRR, no murmur,no extra heart sounds, no edema PSYCHIATRIC: the patient is alert & oriented to person, affect & behavior appropriate  ASSESSMENT/PLAN:  Insomnia -patient does not wish for dose reduction of trazodone.  CPT CODE: 47829

## 2013-06-09 ENCOUNTER — Non-Acute Institutional Stay: Payer: BC Managed Care – PPO | Admitting: Internal Medicine

## 2013-06-09 DIAGNOSIS — E1059 Type 1 diabetes mellitus with other circulatory complications: Secondary | ICD-10-CM

## 2013-06-09 DIAGNOSIS — E785 Hyperlipidemia, unspecified: Secondary | ICD-10-CM

## 2013-06-09 DIAGNOSIS — I10 Essential (primary) hypertension: Secondary | ICD-10-CM

## 2013-06-09 DIAGNOSIS — I639 Cerebral infarction, unspecified: Secondary | ICD-10-CM

## 2013-06-09 DIAGNOSIS — I6350 Cerebral infarction due to unspecified occlusion or stenosis of unspecified cerebral artery: Secondary | ICD-10-CM

## 2013-06-11 ENCOUNTER — Encounter: Payer: Self-pay | Admitting: Internal Medicine

## 2013-06-11 NOTE — Progress Notes (Signed)
PROGRESS NOTE  DATE: 06/09/13  FACILITY: [facility]  LEVEL OF CARE: SNF  Routine Visit  CHIEF COMPLAINT:  Manage diabetes mellitus and hyperlipidemia  HISTORY OF PRESENT ILLNESS:  REASSESSMENT OF ONGOING PROBLEM(S):  DM:pt's DM remains stable.  staff deny polyuria, polydipsia, polyphagia, changes in vision or hypoglycemic episodes.  No complications noted from the medication presently being used.  Last hemoglobin A1c is: 6.7 in 11/13, in 5/14 hemoglobin A1c 6.2.  HYPERLIPIDEMIA: No complications from the medications presently being used. Last fasting lipid panel showed : 11/13 fasting lipid panel normal, in 5/14 HDL 27 otherwise fasting lipid panel normal.  PAST MEDICAL HISTORY : Reviewed.  No changes.  CURRENT MEDICATIONS: Reviewed per [facility]  REVIEW OF SYSTEMS:unobtainable due to pt being a poor historian  PHYSICAL EXAMINATION  VS:  T 97.7     P 67      RR 20     BP 116/80     POX %     WT (Lb) 146  GENERAL: no acute distress, thin body habitus NECK: supple, trachea midline, no neck masses, no thyroid tenderness, no thyromegaly RESPIRATORY: breathing is even & unlabored, BS CTAB CARDIAC: RRR, no murmur,no extra heart sounds, no edema GI: abdomen soft, normal BS, no masses, no tenderness, no hepatomegaly, no splenomegaly PSYCHIATRIC: the patient is alert & oriented to person, affect & behavior appropriate  LABS/RADIOLOGY:  7/14 Hb 11.2, mcv87 ow cbc nl, glc 163 ow bmp nl  5/14 albumin 3.4, total protein 5.8 otherwise liver profile normal  3/14 hemoglobin 11.4, MCV 83.1 otherwise CBC normal, glucose 123 otherwise BMP normal 11/13 total protein 5.8, albumin 3.1, alkaline phosphatase 119 otherwise he will profile normal  ASSESSMENT/PLAN:  diabetes mellitus-well controlled. Check hemoglobin A1c hyperlipidemia-well controlled. Check fasting lipid panel hypertension-well-controlled. history of CVA-stable. Insomnia-denies ongoing symptoms. vascular  dementia-stable. Check liver profile  CPT CODE: 16109

## 2013-07-07 ENCOUNTER — Encounter: Payer: Self-pay | Admitting: Internal Medicine

## 2013-07-07 ENCOUNTER — Non-Acute Institutional Stay: Payer: BC Managed Care – PPO | Admitting: Internal Medicine

## 2013-07-07 DIAGNOSIS — E1165 Type 2 diabetes mellitus with hyperglycemia: Secondary | ICD-10-CM

## 2013-07-07 DIAGNOSIS — I10 Essential (primary) hypertension: Secondary | ICD-10-CM

## 2013-07-07 DIAGNOSIS — I639 Cerebral infarction, unspecified: Secondary | ICD-10-CM

## 2013-07-07 DIAGNOSIS — E7800 Pure hypercholesterolemia, unspecified: Secondary | ICD-10-CM

## 2013-07-07 DIAGNOSIS — I6350 Cerebral infarction due to unspecified occlusion or stenosis of unspecified cerebral artery: Secondary | ICD-10-CM

## 2013-07-07 NOTE — Progress Notes (Signed)
PROGRESS NOTE  DATE: 07/07/13  FACILITY: [facility]  LEVEL OF CARE: SNF  Routine Visit  CHIEF COMPLAINT:  Manage diabetes mellitus, hypertension and hyperlipidemia  HISTORY OF PRESENT ILLNESS:  REASSESSMENT OF ONGOING PROBLEM(S):  DM:pt's DM remains stable.  staff deny polyuria, polydipsia, polyphagia, changes in vision or hypoglycemic episodes.  No complications noted from the medication presently being used.  Last hemoglobin A1c is: 6.7 in 11/13, in 5/14 hemoglobin A1c 6.2, in 11-14 hemoglobin A1c 7.6  HYPERLIPIDEMIA: No complications from the medications presently being used. Last fasting lipid panel showed : 11/13 fasting lipid panel normal, in 5/14 HDL 27 otherwise fasting lipid panel normal, in 11-14 HDL 27 otherwise phosgene lipid panel normal  HTN: Pt 's HTN remains stable.  Denies CP, sob, DOE, pedal edema, headaches, dizziness or visual disturbances.  No complications from the medications currently being used.  Last BP : 102/62  PAST MEDICAL HISTORY : Reviewed.  No changes.  CURRENT MEDICATIONS: Reviewed per [facility]  REVIEW OF SYSTEMS:unobtainable due to pt being a poor historian  PHYSICAL EXAMINATION  VS:  T 97.6     P 74      RR 20     BP 102/62     POX %     WT (Lb) 147  GENERAL: no acute distress, thin body habitus EYES: Normal sclerae, normal conjunctivae, no discharge NECK: supple, trachea midline, no neck masses, no thyroid tenderness, no thyromegaly LYMPHATICS: No cervical lymphadenopathy, no supraclavicular lymphadenopathy RESPIRATORY: breathing is even & unlabored, BS CTAB CARDIAC: RRR, no murmur,no extra heart sounds, no edema GI: abdomen soft, normal BS, no masses, no tenderness, no hepatomegaly, no splenomegaly PSYCHIATRIC: the patient is alert & oriented to person, affect & behavior appropriate  LABS/RADIOLOGY:  11-14 total protein 5.8, albumin 3.3 otherwise liver profile normal 7/14 Hb 11.2, mcv87 ow cbc nl, glc 163 ow bmp nl  5/14 albumin 3.4,  total protein 5.8 otherwise liver profile normal  3/14 hemoglobin 11.4, MCV 83.1 otherwise CBC normal, glucose 123 otherwise BMP normal 11/13 total protein 5.8, albumin 3.1, alkaline phosphatase 119 otherwise he will profile normal  ASSESSMENT/PLAN:  diabetes mellitus-uncontrolled. Increase Levemir to 15 units each bedtime hyperlipidemia-well controlled.  hypertension-well-controlled. history of CVA-stable. Insomnia-denies ongoing symptoms. vascular dementia-stable.  CPT CODE: 95621

## 2013-08-04 ENCOUNTER — Non-Acute Institutional Stay: Payer: BC Managed Care – PPO | Admitting: Internal Medicine

## 2013-08-04 DIAGNOSIS — F329 Major depressive disorder, single episode, unspecified: Secondary | ICD-10-CM

## 2013-08-04 DIAGNOSIS — F3289 Other specified depressive episodes: Secondary | ICD-10-CM

## 2013-08-04 NOTE — Progress Notes (Signed)
         PROGRESS NOTE  DATE: 08/04/2013  FACILITY:  [facility]  LEVEL OF CARE: SNF (31)  Acute Visit  CHIEF COMPLAINT:  Manage depression  HISTORY OF PRESENT ILLNESS: I was requested by the staff to assess the patient regarding above problem(s):  DEPRESSION: The depression remains stable. Patient denies ongoing feelings of sadness, insomnia, anedhonia or lack of appetite. No complications reported from the medications currently being used. Staff do not report behavioral problems. I was requested by the pharmacy consultant to assess the patient for possible dose reduction Zoloft.  PAST MEDICAL HISTORY : Reviewed.  No changes.  CURRENT MEDICATIONS: Reviewed per [facility]  PHYSICAL EXAMINATION  GENERAL: no acute distress, normal body habitus RESPIRATORY: breathing is even & unlabored, BS CTAB CARDIAC: RRR, no murmur,no extra heart sounds, no edema PSYCHIATRIC: the patient is alert & oriented to person, affect & behavior appropriate  ASSESSMENT/PLAN:  Depression-stable. Decrease Zoloft to 25 mg daily.  CPT CODE: 23557

## 2013-08-25 ENCOUNTER — Non-Acute Institutional Stay: Payer: BC Managed Care – PPO | Admitting: Internal Medicine

## 2013-08-25 DIAGNOSIS — IMO0002 Reserved for concepts with insufficient information to code with codable children: Secondary | ICD-10-CM

## 2013-08-25 DIAGNOSIS — E118 Type 2 diabetes mellitus with unspecified complications: Principal | ICD-10-CM

## 2013-08-25 DIAGNOSIS — E7800 Pure hypercholesterolemia, unspecified: Secondary | ICD-10-CM

## 2013-08-25 DIAGNOSIS — I10 Essential (primary) hypertension: Secondary | ICD-10-CM

## 2013-08-25 DIAGNOSIS — I6350 Cerebral infarction due to unspecified occlusion or stenosis of unspecified cerebral artery: Secondary | ICD-10-CM

## 2013-08-25 DIAGNOSIS — I639 Cerebral infarction, unspecified: Secondary | ICD-10-CM

## 2013-08-25 DIAGNOSIS — E1165 Type 2 diabetes mellitus with hyperglycemia: Secondary | ICD-10-CM

## 2013-08-29 NOTE — Progress Notes (Signed)
         PROGRESS NOTE  DATE: 08/25/13  FACILITY: [facility]  LEVEL OF CARE: SNF  Routine Visit  CHIEF COMPLAINT:  Manage diabetes mellitus, hypertension and hyperlipidemia  HISTORY OF PRESENT ILLNESS:  REASSESSMENT OF ONGOING PROBLEM(S):  DM:pt's DM remains stable.  staff deny polyuria, polydipsia, polyphagia, changes in vision or hypoglycemic episodes.  No complications noted from the medication presently being used.  Last hemoglobin A1c is: 6.7 in 11/13, in 5/14 hemoglobin A1c 6.2, in 11-14 hemoglobin A1c 7.6  HYPERLIPIDEMIA: No complications from the medications presently being used. Last fasting lipid panel showed : 11/13 fasting lipid panel normal, in 5/14 HDL 27 otherwise fasting lipid panel normal, in 11-14 HDL 27 otherwise phosgene lipid panel normal  HTN: Pt 's HTN remains stable.  Denies CP, sob, DOE, pedal edema, headaches, dizziness or visual disturbances.  No complications from the medications currently being used.  Last BP : 102/62  PAST MEDICAL HISTORY : Reviewed.  No changes.  CURRENT MEDICATIONS: Reviewed per [facility]  REVIEW OF SYSTEMS:unobtainable due to pt being a poor historian  PHYSICAL EXAMINATION  VS:  T 97.6     P 70      RR 20     BP 102/62     POX %     WT (Lb) 147  GENERAL: no acute distress, thin body habitus NECK: supple, trachea midline, no neck masses, no thyroid tenderness, no thyromegaly RESPIRATORY: breathing is even & unlabored, BS CTAB CARDIAC: RRR, no murmur,no extra heart sounds, no edema GI: abdomen soft, normal BS, no masses, no tenderness, no hepatomegaly, no splenomegaly PSYCHIATRIC: the patient is alert & oriented to person, affect & behavior appropriate  LABS/RADIOLOGY:  11-14 total protein 5.8, albumin 3.3 otherwise liver profile normal 7/14 Hb 11.2, mcv87 ow cbc nl, glc 163 ow bmp nl  5/14 albumin 3.4, total protein 5.8 otherwise liver profile normal  3/14 hemoglobin 11.4, MCV 83.1 otherwise CBC normal, glucose 123 otherwise BMP  normal 11/13 total protein 5.8, albumin 3.1, alkaline phosphatase 119 otherwise he will profile normal  ASSESSMENT/PLAN:  diabetes mellitus-uncontrolled. Levemir was increased. Check hemoglobin A1c hyperlipidemia-well controlled.  hypertension-well-controlled. history of CVA-stable. Insomnia-denies ongoing symptoms. vascular dementia-stable. Depression-Zoloft was decreased Check CBC and BMP  CPT CODE: 21308

## 2013-10-23 ENCOUNTER — Encounter: Payer: Self-pay | Admitting: Internal Medicine

## 2013-10-23 ENCOUNTER — Non-Acute Institutional Stay: Payer: BC Managed Care – PPO | Admitting: Internal Medicine

## 2013-10-23 DIAGNOSIS — IMO0002 Reserved for concepts with insufficient information to code with codable children: Secondary | ICD-10-CM

## 2013-10-23 DIAGNOSIS — F3289 Other specified depressive episodes: Secondary | ICD-10-CM

## 2013-10-23 DIAGNOSIS — I639 Cerebral infarction, unspecified: Secondary | ICD-10-CM

## 2013-10-23 DIAGNOSIS — F329 Major depressive disorder, single episode, unspecified: Secondary | ICD-10-CM

## 2013-10-23 DIAGNOSIS — E118 Type 2 diabetes mellitus with unspecified complications: Secondary | ICD-10-CM

## 2013-10-23 DIAGNOSIS — I6350 Cerebral infarction due to unspecified occlusion or stenosis of unspecified cerebral artery: Secondary | ICD-10-CM

## 2013-10-23 DIAGNOSIS — L299 Pruritus, unspecified: Secondary | ICD-10-CM | POA: Insufficient documentation

## 2013-10-23 DIAGNOSIS — E1165 Type 2 diabetes mellitus with hyperglycemia: Secondary | ICD-10-CM

## 2013-10-23 DIAGNOSIS — E785 Hyperlipidemia, unspecified: Secondary | ICD-10-CM

## 2013-10-23 DIAGNOSIS — I739 Peripheral vascular disease, unspecified: Secondary | ICD-10-CM

## 2013-10-23 NOTE — Progress Notes (Signed)
Patient ID: Jeremiah Baldwin, male   DOB: September 13, 1945, 68 y.o.   MRN: [id number]   This is a routine visit.  Level of care skilled.  [place]  Chief complaint-medical management of chronic conditions including diabetes hypertension history CVA --history left above-the-knee amputation.  History of present illness.  Patient is an elderly resident with the above diagnoses according to nursing staff he remains fairly stable-he does apparently have some chronic itching he is receiving hydrocortisone when necessary apparently he continues to itch his left stump area and arms-.  Patient has no acute complaints he appears to be somewhat of a poor historian however.  His other medical issues appear to be stable blood pressures recently 134/68 109/61 he is on enalapril.  He does have a history CVA with some left-sided weakness he does continue on Plavix for anticoagulation.  Also a history of diabetes he is on Levemir blood sugars appear to run largely in the low 100s.  For history of peripheral vascular disease again he is status post left above-the-knee amputation and is on Plavix.  Patient does have right leg foot wound issues that is followed by the wound care doctor in the facility these are currently wrapped.  Family medical social history per history and physical 10/03/2012.  Medications have been reviewed per MAR.  Review of systems somewhat limited since patient appears to be somewhat of a poor historian.  In general though he is not complaining fever or chills.  Respiratory no complaints of shortness of breath or cough.  Cardiac denies chest pain has no edema.  Muscle skeletal is status post left above-the-knee amputation does not complaining of pain this evening.  Neurologic-does not complaining of any dizziness or headache is status post CVA with left-sided weakness.  Psych does have a history of depression he is on Zoloft according to nursing staff eats fairly  well.  Physical exam.  Temperature is 97.2 pulse 62 respirations 16 blood pressure 122/64 area  In general this is a somewhat frail elderly male in no distress lying comfortably in bed.  His skin is warm and dry--I do note some scratch marks on his left arm this does not appear to be cellulitic.  Eyes appear active to light sclera and conjunctiva are clear.  Oropharynx clear mucous membranes moist.  Chest is clear to auscultation with poor respiratory effort no labored breathing.  Heart is regular rate and rhythm without murmur gallop or rub there is no lower extremity edema.  Abdomen soft nontender with positive bowel sounds.  Muscle skeletal is status post left AKA he does have cathartic appearance to his right leg there is wrapping of the wounds.  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  Psych he is oriented to self does follow simple verbal commands as exam progressed he appeared to be somewhat agitated.  Labs.  Feb- third 2015.  Sodium 137 potassium 3.9 BUN 19 creatinine 0.7.  Liver function tests within normal limits except albumin of 3.3.  CBC 9.2 hemoglobin 13.5 platelets 167.  Lipid panel 06/10/2013.  Cholesterol 123 LDL 75 HDL 27 triglycerides 107.  Assessment and plan.  Diabetes-this appears to be under decent control Levemir was recently increased Will update hemoglobin A1c.  Hypertension-this appears stable on ACE inhibitor.  History CVA--this appears to be at baseline he is on Plavix.  Vascular dementia this appears to be at baseline.  Peripheral vascular disease-he is on Plavix wounds are followed by the wound care physician in the facility.  Hyperlipidemia-will  update  lipid panel recent liver function tests appear to be satisfactory.  Depression-is on Zoloft this was recently decreased appears to have tolerated this well  Anemia-hemoglobin appear to be stable--we'll update with rest of lab work.  Itching-this has been  persistent he is receiving hydroxyzine when necessary Will write for a dermatology consult  Insomnia-he continues on trazodone as needed--apparently this is effective  [id number].

## 2013-10-29 ENCOUNTER — Non-Acute Institutional Stay: Payer: BC Managed Care – PPO | Admitting: Internal Medicine

## 2013-10-29 DIAGNOSIS — D72829 Elevated white blood cell count, unspecified: Secondary | ICD-10-CM

## 2013-10-29 NOTE — Progress Notes (Signed)
Patient ID: Jeremiah Baldwin, male   DOB: [birth year], 68 y.o.   MRN: [id number]   This is an acute visit.  Level of care skilled.  [place]   Chief complaint-acute visit secondary to leukocytosis   History of present illness.  Patient is an elderly resident who was seen last week for routine visit and routine lab work was ordered-this has come back relatively baseline except for an elevated white count of 13.3.  According to nursing staff he has been at his baseline he does have a history of CVA as well as peripheral vascular disease and a right leg and foot wounds that is followed by the wound care physician in the facility-apparently these are stable but we will make him aware of the blood work.  Patient himself has no acute complaints does not complaining of any shortness of breath or cough nursing staff has not noted this either-he is afebrile he denies any dysuria . ot wound issues that is followed by the wound care doctor in the facility these are currently wrapped .  Family medical social history per history and physical 10/03/2012--and progress note on 10/23/2013.   Medications have been reviewed per MAR.   Review of systems somewhat limited since patient appears to be somewhat of a poor historian.  In general though he is not complaining fever or chills.  Respiratory no complaints of shortness of breath or cough.  Cardiac denies chest pain has no edema.  Muscle skeletal is status post left above-the-knee amputation does not complain of pain . GU-denies dysuria   Neurologic-does not complaining of any dizziness or headache is status post CVA with left-sided weakness.  Psych does have a history of depression he is on Zoloft according to nursing staff eats fairly well .  Physical exam.  Temperature 97.0 pulse 87 respirations 20 blood pressure 112/73 O2 saturation 94% on room air In general this is a somewhat frail elderly male in no distress lying comfortably in bed.    His skin is warm and dry--I do note some scratch marks on his left arm this does not appear to be cellulitic.--This appears relatively unchanged from last week  Eyes appear active to light sclera and conjunctiva are clear.  Oropharynx clear mucous membranes moist.  Chest is clear to auscultation with poor respiratory effort no labored breathing.  Heart is regular rate and rhythm without murmur gallop or rub there is no lower extremity edema.  Abdomen soft nontender with positive bowel sounds GU-could not really appreciate any suprapubic tenderness mass or CV tenderness.  Muscle skeletal is status post left AKA he does have cathartic appearance to his right leg there is wrapping of the wounds.  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  Psych he is oriented to self does follow simple verbal commands as exam progressed he appeared to be somewhat agitated as exam proceeded which was case  last week as well.   Labs 10/28/2013.  WBC 13.3 hemoglobin 13.9 platelets 242.  Sodium 136 potassium 4.2 BUN 16 creatinine 0.6.  Hemoglobin A1c 6.6.  Cholesterol 146 HDL 29 LDL 92 triglycerides 125.  .  Feb- third 2015.  Sodium 137 potassium 3.9 BUN 19 creatinine 0.7.  Liver function tests within normal limits except albumin of 3.3.  CBC 9.2 hemoglobin 13.5 platelets 167.  Lipid panel 06/10/2013.  Cholesterol 123 LDL 75 HDL 27 triglycerides 107 .  Assessment and plan.  #1-leukocytosi  unknown origin-new problem--he does not really show any overt signs of  infection we'll order a urinalysis and culture as well as a chest x-ray-also have wound care physician notified of elevated white count for followup of wound issues Also obtain CBC with differential to mild staff notify provider of results.  Also monitor vital signs and pulse ox every shift to keep an eye on this.  [id number]

## 2013-11-02 ENCOUNTER — Encounter: Payer: Self-pay | Admitting: Internal Medicine

## 2013-11-18 ENCOUNTER — Non-Acute Institutional Stay: Payer: BC Managed Care – PPO | Admitting: Internal Medicine

## 2013-11-18 DIAGNOSIS — I639 Cerebral infarction, unspecified: Secondary | ICD-10-CM

## 2013-11-18 DIAGNOSIS — I6350 Cerebral infarction due to unspecified occlusion or stenosis of unspecified cerebral artery: Secondary | ICD-10-CM

## 2013-11-18 DIAGNOSIS — I10 Essential (primary) hypertension: Secondary | ICD-10-CM

## 2013-11-18 DIAGNOSIS — E1059 Type 1 diabetes mellitus with other circulatory complications: Secondary | ICD-10-CM

## 2013-11-18 DIAGNOSIS — E785 Hyperlipidemia, unspecified: Secondary | ICD-10-CM

## 2013-11-20 NOTE — Progress Notes (Signed)
          PROGRESS NOTE  DATE: 11/18/13  FACILITY: [facility]  LEVEL OF CARE: SNF  Routine Visit  CHIEF COMPLAINT:  Manage diabetes mellitus, hypertension and hyperlipidemia  HISTORY OF PRESENT ILLNESS:  REASSESSMENT OF ONGOING PROBLEM(S):  DM:pt's DM remains stable.  staff deny polyuria, polydipsia, polyphagia, changes in vision or hypoglycemic episodes.  No complications noted from the medication presently being used.  Last hemoglobin A1c is: 6.7 in 11/13, in 5/14 hemoglobin A1c 6.2, in 11-14 hemoglobin A1c 7.6, in 4-15 hemoglobin A1c 6.6.  HYPERLIPIDEMIA: No complications from the medications presently being used. Last fasting lipid panel showed : 11/13 fasting lipid panel normal, in 5/14 HDL 27 otherwise fasting lipid panel normal, in 11-14 HDL 27 otherwise phosgene lipid panel normal, in 4-15 HDL 29 otherwise fasting lipid panel normal  HTN: Pt 's HTN remains stable.  Denies CP, sob, DOE, pedal edema, headaches, dizziness or visual disturbances.  No complications from the medications currently being used.  Last BP : 102/62, 108/60  PAST MEDICAL HISTORY : Reviewed.  No changes.  CURRENT MEDICATIONS: Reviewed per [facility]  REVIEW OF SYSTEMS:unobtainable due to pt being a poor historian  PHYSICAL EXAMINATION  VS: See vital signs section  GENERAL: no acute distress, thin body habitus EYES: Normal sclerae, normal conjunctivae, no discharge NECK: supple, trachea midline, no neck masses, no thyroid tenderness, no thyromegaly LYMPHATICS: No cervical lymphadenopathy, no supraclavicular lymphadenopathy  RESPIRATORY: breathing is even & unlabored, BS CTAB CARDIAC: RRR, no murmur,no extra heart sounds, no edema GI: abdomen soft, normal BS, no masses, no tenderness, no hepatomegaly, no splenomegaly PSYCHIATRIC: the patient is alert & oriented to person, affect & behavior appropriate  LABS/RADIOLOGY: 4-15 WBC 11.1 otherwise CBC normal, BMP normal 2-15 glucose 238, total protein 5.7,  albumin 3.3 otherwise CMP normal 11-14 total protein 5.8, albumin 3.3 otherwise liver profile normal 7/14 Hb 11.2, mcv87 ow cbc nl, glc 163 ow bmp nl  5/14 albumin 3.4, total protein 5.8 otherwise liver profile normal  3/14 hemoglobin 11.4, MCV 83.1 otherwise CBC normal, glucose 123 otherwise BMP normal 11/13 total protein 5.8, albumin 3.1, alkaline phosphatase 119 otherwise he will profile normal  ASSESSMENT/PLAN:  diabetes mellitus-well controlled. hyperlipidemia-well controlled.  hypertension-well-controlled. history of CVA-stable. Insomnia-denies ongoing symptoms. vascular dementia-stable. Depression-stable  CPT CODE: 40981  Jeremiah Baldwin Y. Durwin Reges, [place] [phone]

## 2014-02-21 LAB — BASIC METABOLIC PANEL
BUN: 19 mg/dL (ref 4–21)
Creatinine: 0.6 mg/dL (ref 0.6–1.3)

## 2014-02-21 LAB — HEMOGLOBIN A1C: Hgb A1c MFr Bld: 6.6 % — AB (ref 4.0–6.0)

## 2014-02-21 LAB — CBC AND DIFFERENTIAL
HCT: 40 % — AB (ref 41–53)
HEMOGLOBIN: 14 g/dL (ref 13.5–17.5)

## 2014-02-25 ENCOUNTER — Non-Acute Institutional Stay: Payer: Self-pay | Admitting: Internal Medicine

## 2014-02-25 ENCOUNTER — Encounter: Payer: Self-pay | Admitting: Internal Medicine

## 2014-02-25 DIAGNOSIS — E118 Type 2 diabetes mellitus with unspecified complications: Secondary | ICD-10-CM

## 2014-02-25 DIAGNOSIS — E1165 Type 2 diabetes mellitus with hyperglycemia: Secondary | ICD-10-CM

## 2014-02-25 DIAGNOSIS — F3289 Other specified depressive episodes: Secondary | ICD-10-CM

## 2014-02-25 DIAGNOSIS — H109 Unspecified conjunctivitis: Secondary | ICD-10-CM | POA: Insufficient documentation

## 2014-02-25 DIAGNOSIS — H8110 Benign paroxysmal vertigo, unspecified ear: Secondary | ICD-10-CM

## 2014-02-25 DIAGNOSIS — I739 Peripheral vascular disease, unspecified: Secondary | ICD-10-CM | POA: Insufficient documentation

## 2014-02-25 DIAGNOSIS — I639 Cerebral infarction, unspecified: Secondary | ICD-10-CM

## 2014-02-25 DIAGNOSIS — IMO0002 Reserved for concepts with insufficient information to code with codable children: Secondary | ICD-10-CM

## 2014-02-25 DIAGNOSIS — F329 Major depressive disorder, single episode, unspecified: Secondary | ICD-10-CM

## 2014-02-25 DIAGNOSIS — I10 Essential (primary) hypertension: Secondary | ICD-10-CM

## 2014-02-25 DIAGNOSIS — K219 Gastro-esophageal reflux disease without esophagitis: Secondary | ICD-10-CM

## 2014-02-25 NOTE — Assessment & Plan Note (Signed)
Continue Meclizine. He reports dizziness with position changes at times.

## 2014-02-25 NOTE — Progress Notes (Unsigned)
Patient ID: Jeremiah Baldwin, male   DOB: 1946/03/26, 68 y.o.   MRN: [id number]   [place] [phone])  Code Status:  Full code  Chief Complaint  Patient presents with  . Medical Management of Chronic Issues    HPI:  This is a 68 y.o. 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 He does not have any complaints today. I am here to review his chronic issues. He is frail and non ambulatory.     Allergies  Allergen Reactions  . Codeine     MEDICATIONS -  Reviewed Outpatient Encounter Prescriptions as of 02/25/2014  Medication Sig  . clopidogrel (PLAVIX) 75 MG tablet Take 75 mg by mouth daily.  Marland Kitchen docusate sodium (COLACE) 100 MG capsule Take 300 mg by mouth 3 (three) times daily.  . enalapril (VASOTEC) 5 MG tablet Take 5 mg by mouth daily.  Marland Kitchen HYDROcodone-acetaminophen (NORCO/VICODIN) 5-325 MG per tablet Take 1 tablet by mouth every 4 (four) hours as needed for pain.  Marland Kitchen insulin detemir (LEVEMIR) 100 UNIT/ML injection Inject 15 Units into the skin daily.   Marland Kitchen LORazepam (ATIVAN) 0.5 MG tablet Take 0.5 mg by mouth daily as needed for anxiety (prior to dental appointments).  . meclizine (ANTIVERT) 25 MG tablet Take 25 mg by mouth 3 (three) times daily.  . pantoprazole (PROTONIX) 40 MG tablet Take 40 mg by mouth daily.  . sertraline (ZOLOFT) 50 MG tablet Take 25 mg by mouth daily.   . traZODone (DESYREL) 50 MG tablet Take 100 mg by mouth at bedtime.    LABS 10/28/13: WBC 11.1, Hgb 14, plt 187, A1C 6.6, NA 136, K 4.2, Cl 103, glucose 83, BUN 16, Cr 0.6,  LDL 92, TC 146, Tri 125  REVIEW OF SYSTEMS  DATA OBTAINED: from patient, nurse, medical record, GENERAL: Feels well  No recent fever, fatigue, change in activity status, appetite, or weight  EYES: denies change in vision, burning or itching RESPIRATORY: No cough, wheezing, SOB CARDIAC: No chest pain, palpitations. No edema GI: No abdominal pain  No Nausea,vomiting,diarrhea or  constipation  No heartburn or reflux  MUSCULOSKELETAL:contracture to left hand NEUROLOGIC: Dizziness with change in position. No change in mental status. Itching to RLE, left sided hemiparesis. PSYCHIATRIC: No feelings of anxiety, depression  Sleeps well   No behavior issue  PHYSICAL EXAM Filed Vitals:   02/25/14 1537  BP: 114/67  Pulse: 72  Resp: 20  Weight: 146 lb (66.225 kg)   Body mass index is 19.27 kg/(m^2). GENERAL APPEARANCE: No acute distress, appropriately groomed, frail body habitus Alert, pleasant, conversant. SKIN: No diaphoresis, rash, wound. Healed abrasions to the top of right foot and knee. HEAD: Normocephalic, atraumatic EYES: Conjunctiva/lids clear  RESPIRATORY: Breathing is even, unlabored  Lung sounds are clear and decreased bases CARDIOVASCULAR: Heart RRR   No murmur or extra heart sounds   EDEMA: No peripheral edema  Right palpable pedal pulse. MUSCULOSKELETAL.  Left sided hemiparesis, can move left arm but not hand. Left hand with contracture. PSYCHIATRIC: Mood and affect appropriate to situation.Alert and oriented x2  ASSESSMENT/PLAN  CVA (cerebral vascular accident) No new issues. Has left sided weakness. Continue plavix and monitor for s/e.  Type II or unspecified type diabetes mellitus with unspecified complication, uncontrolled Continue Levemir 15 units daily. A1C at goal of 6.6%  Benign paroxysmal positional vertigo Continue Meclizine. He reports dizziness with position changes at times.   Essential hypertension, benign BP  stable on enalapril 5 mg daily.  Last BMP WNL.  GERD (gastroesophageal reflux disease) Stable on protonix 40 mg daily  Depressive disorder, not elsewhere classified Stable on trazodone and Zoloft. Continue current meds and monitor.  Peripheral vascular disease, unspecified Right lower ext needs podiatry consult to perform nail care. No active wounds, pulse noted. Multiple healing abrasions to the right knee and top of foot  will need close monitoring.   Conjunctivitis Gentamicin OP 2 gtts  to both eyes TID for 7 days.   CBC CMP A1C q61mo  Royal Hawthorn RN, MSN/Anne Alexander M.D.  02/25/2014

## 2014-02-25 NOTE — Assessment & Plan Note (Signed)
Right lower ext needs podiatry consult to perform nail care. No active wounds, pulse noted. Multiple healing abrasions to the right knee and top of foot will need close monitoring.

## 2014-02-25 NOTE — Assessment & Plan Note (Signed)
Stable on protonix 40mg daily.  °

## 2014-02-25 NOTE — Assessment & Plan Note (Signed)
Continue Levemir 15 units daily. A1C at goal of 6.6%

## 2014-02-25 NOTE — Assessment & Plan Note (Signed)
Stable on trazodone and Zoloft. Continue current meds and monitor.

## 2014-02-25 NOTE — Assessment & Plan Note (Signed)
BP stable on enalapril 5 mg daily.  Last BMP WNL.

## 2014-02-25 NOTE — Assessment & Plan Note (Signed)
No new issues. Has left sided weakness. Continue plavix and monitor for s/e.

## 2014-02-25 NOTE — Assessment & Plan Note (Signed)
Gentamicin OP 2 gtts  to both eyes TID for 7 days.

## 2014-03-02 ENCOUNTER — Non-Acute Institutional Stay: Payer: BC Managed Care – PPO | Admitting: Internal Medicine

## 2014-03-02 DIAGNOSIS — D72829 Elevated white blood cell count, unspecified: Secondary | ICD-10-CM | POA: Diagnosis not present

## 2014-03-03 DIAGNOSIS — D72829 Elevated white blood cell count, unspecified: Secondary | ICD-10-CM | POA: Insufficient documentation

## 2014-03-03 NOTE — Progress Notes (Signed)
Patient ID: Jeremiah Baldwin, male   DOB: [birth year], 68 y.o.   MRN: [id number]   this is an acute visit.  Level of care skilled.  Facility AF  Date is 03/02/2014.  Chief complaint-acute visit secondary to mild leukocytosis  History of present illness.  Patient is a pleasant 68 year old male with a history of CVA--peripheral vascular disease status post left AKA.  He has been relatively baseline here for a significant amount of time that she was seen by our service recently for a routine visit and lab work has come back showing an elevated white count of 11.5.  Patient did have a white count over 13 back in [place] assessed him and and obtained a urine culture which did grow out enterococcus-he was treated with an antibiotic and this apparently resolved.  Currently he denies any fever chills or dysuria shortness of breath cough or chest congestion appears to be at his baseline.  Family medical social history as been reviewed most recently progress note on 02/25/2014.  Medications have been reviewed per MAR.     REVIEW OF SYSTEMS  DATA OBTAINED: from patient, nurse, medical record,  GENERAL: Feels well No recent fever, fatigue, change in activity status, appetite, or weight  EYES: denies change in vision, burning or itching  RESPIRATORY: No cough, wheezing, SOB  CARDIAC: No chest pain, palpitations. No edema  GI: No abdominal pain No Nausea,vomiting,diarrhea or constipation No heartburn or reflux  MUSCULOSKELETAL:contracture to left hand  NEUROLOGIC: Not complain of headache does have dizziness which appears to be vertigo related is affected by position.  PSYCHIATRIC: No feelings of anxiety, depression Sleeps well No behavior issue  PHYSICAL EXAM     Vital signs are stable he has been afebrile pulse 68 respirations 19 blood pressure 106/65 GENERAL APPEARANCE: No acute distress, appropriately groomed, frail body habitus Alert, pleasant, conversant.  SKIN: No diaphoresis, rash,  wound. Healed abrasions to the top of right foot and knee.  HEAD: Normocephalic, atraumatic  EYES: Conjunctiva/lids clear  RESPIRATORY: Breathing is even, unlabored Lung sounds are clear and decreased bases  CARDIOVASCULAR: Heart RRR No murmur or extra heart sounds  EDEMA: No peripheral edema Right palpable pedal pulse GU-could not really appreciate any suprapubic distention or tenderness.  MUSCULOSKELETAL. Left sided hemiparesis, can move left arm but not hand. Left hand with contracture.  PSYCHIATRIC: Mood and affect appropriate to situation.Alert and oriented x2  Labs.  On the seventh 2015.  WBC 11.5 hemoglobin 13.2 platelets 190.  Sodium 137 potassium 3.7 BUN 17 creatinine 0.7.  Liver function tests within normal limits except albumin of 3.3.  Assessment plan.  Leukocytosis-he does not appear to be overtly symptomatic-it is mildly elevated-will repeat the lab tomorrow with a differential-also with his history UTI previously we'll order a UA CNS-also monitor vital signs every shift for 72 hours with pulse ox   [id number]

## 2014-04-01 ENCOUNTER — Encounter: Payer: Self-pay | Admitting: Internal Medicine

## 2014-04-01 ENCOUNTER — Non-Acute Institutional Stay: Payer: BC Managed Care – PPO | Admitting: Internal Medicine

## 2014-04-01 DIAGNOSIS — F3289 Other specified depressive episodes: Secondary | ICD-10-CM

## 2014-04-01 DIAGNOSIS — F329 Major depressive disorder, single episode, unspecified: Secondary | ICD-10-CM

## 2014-04-01 DIAGNOSIS — I10 Essential (primary) hypertension: Secondary | ICD-10-CM

## 2014-04-01 DIAGNOSIS — I639 Cerebral infarction, unspecified: Secondary | ICD-10-CM

## 2014-04-01 DIAGNOSIS — H8110 Benign paroxysmal vertigo, unspecified ear: Secondary | ICD-10-CM

## 2014-04-01 DIAGNOSIS — E1165 Type 2 diabetes mellitus with hyperglycemia: Secondary | ICD-10-CM

## 2014-04-01 DIAGNOSIS — E118 Type 2 diabetes mellitus with unspecified complications: Secondary | ICD-10-CM

## 2014-04-01 DIAGNOSIS — IMO0002 Reserved for concepts with insufficient information to code with codable children: Secondary | ICD-10-CM

## 2014-04-01 DIAGNOSIS — I6350 Cerebral infarction due to unspecified occlusion or stenosis of unspecified cerebral artery: Secondary | ICD-10-CM

## 2014-04-01 DIAGNOSIS — E785 Hyperlipidemia, unspecified: Secondary | ICD-10-CM

## 2014-04-01 DIAGNOSIS — K219 Gastro-esophageal reflux disease without esophagitis: Secondary | ICD-10-CM

## 2014-04-01 NOTE — Progress Notes (Signed)
MRN: [id number] Name: Jeremiah Baldwin  Sex: male Age: 68 y.o. DOB: Jul 02, 1946  [place] [facility]: [facility] Facility/Room: 215W Level Of Care: SNF Provider: Inocencio Homes D Emergency Contacts: Extended Emergency Contact Information Primary Emergency Contact: Moody,CAROLYN Address: [place],  [phone] Home Phone: [phone] Relation: None  Code Status: FULL  Allergies: Codeine  Chief Complaint  Patient presents with  . Medical Management of Chronic Issues    HPI: Patient is 68 y.o. male who is being seen for routine issues.   Past Medical History  Diagnosis Date  . Allergy   . Anorexia   . Hypertension   . Hyperlipidemia   . Diabetes mellitus without complication   . CAD (coronary artery disease)   . Colon polyp   . Stroke   . Left hemiparesis   . GERD (gastroesophageal reflux disease)   . PVD (peripheral vascular disease)   . Diabetic retinopathy     Past Surgical History  Procedure Laterality Date  . Left bka    . Enteroscopic laser photocoagulation Left 2001      Medication List       This list is accurate as of: 04/01/14 11:59 PM.  Always use your most recent med list.               clopidogrel 75 MG tablet  Commonly known as:  PLAVIX  Take 75 mg by mouth daily.     docusate sodium 100 MG capsule  Commonly known as:  COLACE  Take 300 mg by mouth 3 (three) times daily.     enalapril 5 MG tablet  Commonly known as:  VASOTEC  Take 5 mg by mouth daily.     HYDROcodone-acetaminophen 5-325 MG per tablet  Commonly known as:  NORCO/VICODIN  Take 1 tablet by mouth every 4 (four) hours as needed for pain.     insulin detemir 100 UNIT/ML injection  Commonly known as:  LEVEMIR  Inject 15 Units into the skin daily.     LORazepam 0.5 MG tablet  Commonly known as:  ATIVAN  Take 0.5 mg by mouth daily as needed for anxiety (prior to dental appointments).     meclizine 25 MG tablet  Commonly known as:  ANTIVERT  Take 25 mg by  mouth 3 (three) times daily.     pantoprazole 40 MG tablet  Commonly known as:  PROTONIX  Take 40 mg by mouth daily.     sertraline 50 MG tablet  Commonly known as:  ZOLOFT  Take 25 mg by mouth daily.     traZODone 50 MG tablet  Commonly known as:  DESYREL  Take 100 mg by mouth at bedtime.        No orders of the defined types were placed in this encounter.     There is no immunization history on file for this patient.  History  Substance Use Topics  . Smoking status: Never Smoker   . Smokeless tobacco: Not on file  . Alcohol Use: Not on file    Review of Systems  DATA OBTAINED: from patient GENERAL: Feels well no fevers, fatigue, appetite changes SKIN: No itching, rash HEENT: No complaint RESPIRATORY: No cough, wheezing, SOB CARDIAC: No chest pain, palpitations, lower extremity edema  GI: No abdominal pain, No N/V/D or constipation, No heartburn or reflux  GU: No dysuria, frequency or urgency, or incontinence  MUSCULOSKELETAL: No unrelieved bone/joint pain NEUROLOGIC: No headache, dizziness  PSYCHIATRIC: No overt anxiety or sadness. Sleeps well.   Filed Vitals:   04/01/14 1338  BP: 122/71  Pulse: 107  Temp: 97.2 F (36.2 C)  Resp: 18    Physical Exam  GENERAL APPEARANCE: Alert, conversant. Appropriately groomed. No acute distress  SKIN: No diaphoresis rash HEENT: Unremarkable RESPIRATORY: Breathing is even, unlabored. Lung sounds are clear   CARDIOVASCULAR: Heart RRR no murmurs, rubs or gallops. No peripheral edema  GASTROINTESTINAL: Abdomen is soft, non-tender, not distended w/ normal bowel sounds.  GENITOURINARY: Bladder non tender, not distended  MUSCULOSKELETAL: L AKA NEUROLOGIC: Cranial nerves 2-12 grossly intact. Moves all extremities no tremor. PSYCHIATRIC: Mood and affect appropriate to situation, no behavioral issues  Patient Active Problem List   Diagnosis Date Noted  . Leukocytosis, unspecified 03/03/2014  . Peripheral vascular disease,  unspecified 02/25/2014  . Conjunctivitis 02/25/2014  . Itching 10/23/2013  . Depressive disorder, not elsewhere classified 04/17/2013  . Unspecified pruritic disorder 03/11/2013  . Urinary tract infection, site not specified 02/27/2013  . Pure hypercholesterolemia 11/29/2012  . Type I (juvenile type) diabetes mellitus with peripheral circulatory disorders, not stated as uncontrolled(250.71) 11/29/2012  . Unspecified late effects of cerebrovascular disease 11/29/2012  . Other and unspecified hyperlipidemia 11/11/2012  . Loss of weight 11/11/2012  . Essential hypertension, benign 11/11/2012  . Unspecified constipation 11/11/2012  . Type II or unspecified type diabetes mellitus with unspecified complication, uncontrolled 11/11/2012  . Benign paroxysmal positional vertigo 11/11/2012  . CVA (cerebral vascular accident) 11/11/2012  . Insomnia 11/11/2012  . GERD (gastroesophageal reflux disease) 11/11/2012        Assessment and Plan  CVA (cerebral vascular accident) L side weakness, nothing new, continue plavix  Essential hypertension, benign Stable on low dose enalopril  GERD (gastroesophageal reflux disease) Continue protonix  Type II or unspecified type diabetes mellitus with unspecified complication, uncontrolled A1c 6.6 on 15 u levemir-continue  Benign paroxysmal positional vertigo Chronic, on daily antivert  Depressive disorder, not elsewhere classified Continue zoloft and desyrel  Other and unspecified hyperlipidemia FLP 10/2103 LDL 92, HDL 29 - fine, on no meds    Hennie Duos, MD

## 2014-04-01 NOTE — Assessment & Plan Note (Signed)
Continue protonix  

## 2014-04-01 NOTE — Assessment & Plan Note (Signed)
L side weakness, nothing new, continue plavix

## 2014-04-01 NOTE — Assessment & Plan Note (Signed)
Stable on low dose enalopril

## 2014-04-01 NOTE — Assessment & Plan Note (Signed)
Chronic, on daily antivert

## 2014-04-01 NOTE — Assessment & Plan Note (Signed)
FLP 10/2103 LDL 92, HDL 29 - fine, on no meds

## 2014-04-01 NOTE — Assessment & Plan Note (Signed)
A1c 6.6 on 15 u levemir-continue

## 2014-04-01 NOTE — Assessment & Plan Note (Signed)
Continue zoloft and desyrel

## 2014-06-17 ENCOUNTER — Non-Acute Institutional Stay: Payer: BC Managed Care – PPO | Admitting: Internal Medicine

## 2014-06-17 DIAGNOSIS — I739 Peripheral vascular disease, unspecified: Secondary | ICD-10-CM

## 2014-06-17 DIAGNOSIS — F329 Major depressive disorder, single episode, unspecified: Secondary | ICD-10-CM

## 2014-06-17 DIAGNOSIS — E118 Type 2 diabetes mellitus with unspecified complications: Secondary | ICD-10-CM

## 2014-06-17 DIAGNOSIS — I639 Cerebral infarction, unspecified: Secondary | ICD-10-CM

## 2014-06-17 DIAGNOSIS — F32A Depression, unspecified: Secondary | ICD-10-CM

## 2014-06-17 NOTE — Progress Notes (Signed)
MRN: [id number] Name: Jeremiah Baldwin  Sex: male Age: 68 y.o. DOB: [birth year]  [place] #: [facility] Facility/Room: 215W Level Of Care: SNF Provider: Inocencio Homes D Emergency Contacts: Extended Emergency Contact Information Primary Emergency Contact: Pelley,CAROLYN Address: [place],  [phone] Home Phone: [phone] Relation: None  Code Status: FULL  Allergies: Codeine  Chief Complaint  Patient presents with  . Medical Management of Chronic Issues    HPI: Patient is 68 y.o. male who is being seen for routine issues.  Past Medical History  Diagnosis Date  . Allergy   . Anorexia   . Hypertension   . Hyperlipidemia   . Diabetes mellitus without complication   . CAD (coronary artery disease)   . Colon polyp   . Stroke   . Left hemiparesis   . GERD (gastroesophageal reflux disease)   . PVD (peripheral vascular disease)   . Diabetic retinopathy     Past Surgical History  Procedure Laterality Date  . Left bka    . Enteroscopic laser photocoagulation Left 2001      Medication List       This list is accurate as of: 06/17/14 11:59 PM.  Always use your most recent med list.               clopidogrel 75 MG tablet  Commonly known as:  PLAVIX  Take 75 mg by mouth daily.     docusate sodium 100 MG capsule  Commonly known as:  COLACE  Take 300 mg by mouth 3 (three) times daily.     enalapril 5 MG tablet  Commonly known as:  VASOTEC  Take 5 mg by mouth daily.     HYDROcodone-acetaminophen 5-325 MG per tablet  Commonly known as:  NORCO/VICODIN  Take 1 tablet by mouth every 4 (four) hours as needed for pain.     insulin detemir 100 UNIT/ML injection  Commonly known as:  LEVEMIR  Inject 15 Units into the skin daily.     LORazepam 0.5 MG tablet  Commonly known as:  ATIVAN  Take 0.5 mg by mouth daily as needed for anxiety (prior to dental appointments).     meclizine 25 MG tablet  Commonly known as:  ANTIVERT  Take 25 mg by  mouth 3 (three) times daily.     pantoprazole 40 MG tablet  Commonly known as:  PROTONIX  Take 40 mg by mouth daily.     sertraline 50 MG tablet  Commonly known as:  ZOLOFT  Take 25 mg by mouth daily.     traZODone 50 MG tablet  Commonly known as:  DESYREL  Take 100 mg by mouth at bedtime.        No orders of the defined types were placed in this encounter.     There is no immunization history on file for this patient.  History  Substance Use Topics  . Smoking status: Never Smoker   . Smokeless tobacco: Not on file  . Alcohol Use: Not on file    Review of Systems  DATA OBTAINED: from patient, nurse GENERAL:  no fevers, fatigue, appetite changes SKIN: No itching, rash HEENT: No complaint RESPIRATORY: No cough, wheezing, SOB CARDIAC: No chest pain, palpitations, lower extremity edema  GI: No abdominal pain, No N/V/D or constipation, No heartburn or reflux  GU: No dysuria, frequency or urgency, or incontinence  MUSCULOSKELETAL: No unrelieved bone/joint pain NEUROLOGIC: No headache, dizziness  PSYCHIATRIC:  No overt anxiety or sadness  Filed Vitals:   06/17/14 1252  BP: 113/68  Pulse: 70  Temp: 97.7 F (36.5 C)  Resp: 20    Physical Exam  GENERAL APPEARANCE: Alert, minconversant, No acute distress  SKIN: No diaphoresis rash; superficial a\scabbing R shin, without infection HEENT: Unremarkable RESPIRATORY: Breathing is even, unlabored. Lung sounds are clear   CARDIOVASCULAR: Heart RRR no murmurs, rubs or gallops. No peripheral edema  GASTROINTESTINAL: Abdomen is soft, non-tender, not distended w/ normal bowel sounds.  GENITOURINARY: Bladder non tender, not distended  MUSCULOSKELETAL: LUE contractures, L AKA NEUROLOGIC: Cranial nerves 2-12 grossly intact PSYCHIATRIC: Mood and affect appropriate to situation, no behavioral issues  Patient Active Problem List   Diagnosis Date Noted  . Leukocytosis, unspecified 03/03/2014  . Peripheral vascular disease  02/25/2014  . Conjunctivitis 02/25/2014  . Itching 10/23/2013  . Depression 04/17/2013  . Unspecified pruritic disorder 03/11/2013  . Urinary tract infection, site not specified 02/27/2013  . Pure hypercholesterolemia 11/29/2012  . Type I (juvenile type) diabetes mellitus with peripheral circulatory disorders, not stated as uncontrolled(250.71) 11/29/2012  . Unspecified late effects of cerebrovascular disease 11/29/2012  . Other and unspecified hyperlipidemia 11/11/2012  . Loss of weight 11/11/2012  . Essential hypertension, benign 11/11/2012  . Unspecified constipation 11/11/2012  . Type 2 diabetes with complication 96/22/2979  . Benign paroxysmal positional vertigo 11/11/2012  . CVA (cerebral vascular accident) 11/11/2012  . Insomnia 11/11/2012  . GERD (gastroesophageal reflux disease) 11/11/2012    CBC    Component Value Date/Time   HGB 14.0 02/21/2014   HCT 40* 02/21/2014    CMP     Component Value Date/Time   BUN 19 02/21/2014   CREATININE 0.6 02/21/2014  GFR > 60.  Assessment and Plan  CVA (cerebral vascular accident) Chronic and stable, no declines. Continue plavix.  Type 2 diabetes with complication Last G9Q 6.6 on levemir and ACE. Pt due for labs and these have been ordered.   Peripheral vascular disease Not on statin, last LDL 92 on no meds but may need to consider statin. Pt has superficial scabbing on R shin, without infection. L AKA.  Depression Pt's zoloft has been gradually increased to 100 mg qHS    Hennie Duos, MD

## 2014-06-20 ENCOUNTER — Encounter: Payer: Self-pay | Admitting: Internal Medicine

## 2014-06-20 NOTE — Assessment & Plan Note (Addendum)
Chronic and stable, no declines. Continue plavix.

## 2014-06-20 NOTE — Assessment & Plan Note (Addendum)
Not on statin, last LDL 92 on no meds but may need to consider statin. Pt has superficial scabbing on R shin, without infection. L AKA.

## 2014-06-20 NOTE — Assessment & Plan Note (Signed)
Last A1c 6.6 on levemir and ACE. Pt due for labs and these have been ordered.

## 2014-06-20 NOTE — Assessment & Plan Note (Signed)
Pt's zoloft has been gradually increased to 100 mg qHS

## 2014-08-12 ENCOUNTER — Encounter: Payer: Self-pay | Admitting: Internal Medicine

## 2014-08-12 ENCOUNTER — Non-Acute Institutional Stay: Payer: BLUE CROSS/BLUE SHIELD | Admitting: Internal Medicine

## 2014-08-12 DIAGNOSIS — K219 Gastro-esophageal reflux disease without esophagitis: Secondary | ICD-10-CM

## 2014-08-12 DIAGNOSIS — H8113 Benign paroxysmal vertigo, bilateral: Secondary | ICD-10-CM

## 2014-08-12 DIAGNOSIS — I10 Essential (primary) hypertension: Secondary | ICD-10-CM | POA: Diagnosis not present

## 2014-08-12 DIAGNOSIS — F329 Major depressive disorder, single episode, unspecified: Secondary | ICD-10-CM

## 2014-08-12 DIAGNOSIS — E785 Hyperlipidemia, unspecified: Secondary | ICD-10-CM | POA: Diagnosis not present

## 2014-08-12 DIAGNOSIS — L299 Pruritus, unspecified: Secondary | ICD-10-CM | POA: Diagnosis not present

## 2014-08-12 DIAGNOSIS — I69959 Hemiplegia and hemiparesis following unspecified cerebrovascular disease affecting unspecified side: Secondary | ICD-10-CM

## 2014-08-12 DIAGNOSIS — F32A Depression, unspecified: Secondary | ICD-10-CM

## 2014-08-12 NOTE — Assessment & Plan Note (Signed)
Well controlled on vasotec 5mg ;plan-no change

## 2014-08-12 NOTE — Progress Notes (Signed)
MRN: [id number] Name: Jeremiah Baldwin  Sex: male Age: 69 y.o. DOB: Jun 01, 1946  [place] #: [facility] Facility/Room:215W Level Of Care: SNF Provider: Inocencio Homes D Emergency Contacts: Extended Emergency Contact Information Primary Emergency Contact: Moffitt,CAROLYN Address: [place],  [phone] Home Phone: [phone] Relation: None  Code Status: FULL  Allergies: Codeine  Chief Complaint  Patient presents with  . Medical Management of Chronic Issues    HPI: Patient is 69 y.o. male who is being seen for routine issues.  Past Medical History  Diagnosis Date  . Allergy   . Anorexia   . Hypertension   . Hyperlipidemia   . Diabetes mellitus without complication   . CAD (coronary artery disease)   . Colon polyp   . Stroke   . Left hemiparesis   . GERD (gastroesophageal reflux disease)   . PVD (peripheral vascular disease)   . Diabetic retinopathy     Past Surgical History  Procedure Laterality Date  . Left bka    . Enteroscopic laser photocoagulation Left 2001      Medication List       This list is accurate as of: 08/12/14 11:35 AM.  Always use your most recent med list.               clopidogrel 75 MG tablet  Commonly known as:  PLAVIX  Take 75 mg by mouth daily.     docusate sodium 100 MG capsule  Commonly known as:  COLACE  Take 300 mg by mouth 3 (three) times daily.     doxepin 25 MG capsule  Commonly known as:  SINEQUAN  Take 25 mg by mouth at bedtime.     enalapril 5 MG tablet  Commonly known as:  VASOTEC  Take 5 mg by mouth daily.     fluticasone 50 MCG/ACT nasal spray  Commonly known as:  FLONASE  Place 1 spray into both nostrils 2 (two) times daily as needed for allergies or rhinitis.     insulin detemir 100 UNIT/ML injection  Commonly known as:  LEVEMIR  Inject 15 Units into the skin daily.     meclizine 25 MG tablet  Commonly known as:  ANTIVERT  Take 25 mg by mouth 3 (three) times daily.     olopatadine 0.1 % ophthalmic solution  Commonly known as:  PATANOL  1 drop daily.     pantoprazole 40 MG tablet  Commonly known as:  PROTONIX  Take 40 mg by mouth daily.     sertraline 50 MG tablet  Commonly known as:  ZOLOFT  Take 150 mg by mouth daily.     triamcinolone ointment 0.1 %  Commonly known as:  KENALOG  Apply 1 application topically 2 (two) times daily as needed. R leg        Meds ordered this encounter  Medications  . sertraline (ZOLOFT) 50 MG tablet    Sig: Take 150 mg by mouth daily.  . fluticasone (FLONASE) 50 MCG/ACT nasal spray    Sig: Place 1 spray into both nostrils 2 (two) times daily as needed for allergies or rhinitis.  Marland Kitchen triamcinolone ointment (KENALOG) 0.1 %    Sig: Apply 1 application topically 2 (two) times daily as needed. R leg  . olopatadine (PATANOL) 0.1 % ophthalmic solution    Sig: 1 drop daily.  Marland Kitchen doxepin (SINEQUAN) 25 MG capsule    Sig: Take 25 mg by mouth at bedtime.  There is no immunization history on file for this patient.  History  Substance Use Topics  . Smoking status: Never Smoker   . Smokeless tobacco: Not on file  . Alcohol Use: Not on file    Review of Systems  DATA OBTAINED: from patient, nurse, medical record GENERAL:  no fevers, fatigue, appetite changes SKIN: No itching, rash HEENT: No complaint RESPIRATORY: No cough, wheezing, SOB CARDIAC: No chest pain, palpitations, lower extremity edema  GI: No abdominal pain, No N/V/D or constipation, No heartburn or reflux  GU: No dysuria, frequency or urgency, or incontinence  MUSCULOSKELETAL: No unrelieved bone/joint pain NEUROLOGIC: No headache, dizziness  PSYCHIATRIC: some sadness  Filed Vitals:   08/12/14 1027  BP: 113/68  Pulse: 70  Temp: 97.7 F (36.5 C)  Resp: 20    Physical Exam  GENERAL APPEARANCE: Alert,min conversant, No acute distress  SKIN: No diaphoresis rash HEENT: Unremarkable RESPIRATORY: Breathing is even, unlabored. Lung sounds are  clear   CARDIOVASCULAR: Heart RRR no murmurs, rubs or gallops. No peripheral edema  GASTROINTESTINAL: Abdomen is soft, non-tender, not distended w/ normal bowel sounds.  GENITOURINARY: Bladder non tender, not distended  MUSCULOSKELETAL: L AKA;LUE contractures NEUROLOGIC: Cranial nerves 2-12 grossly intact; L side weakness PSYCHIATRIC: Mood and affect appropriate to situation, no behavioral issues  Patient Active Problem List   Diagnosis Date Noted  . Leukocytosis, unspecified 03/03/2014  . Peripheral vascular disease 02/25/2014  . Conjunctivitis 02/25/2014  . Itching 10/23/2013  . Depression 04/17/2013  . Pruritic disorder 03/11/2013  . Urinary tract infection, site not specified 02/27/2013  . Pure hypercholesterolemia 11/29/2012  . Type I (juvenile type) diabetes mellitus with peripheral circulatory disorders, not stated as uncontrolled(250.71) 11/29/2012  . 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 Hyperlipidemia LDL goal <70 11/11/2012  . Loss of weight 11/11/2012  . Essential hypertension, benign 11/11/2012  . Unspecified constipation 11/11/2012  . Type 2 diabetes with complication 97/98/9211  . Benign paroxysmal positional vertigo 11/11/2012  . CVA (cerebral vascular accident) 11/11/2012  . Insomnia 11/11/2012  . GERD (gastroesophageal reflux disease) 11/11/2012    CBC    Component Value Date/Time   HGB 14.0 02/21/2014   HCT 40* 02/21/2014    CMP     Component Value Date/Time   BUN 19 02/21/2014   CREATININE 0.6 02/21/2014    Assessment and Plan  Essential hypertension, benign Well controlled on vasotec 5mg ;plan-no change   GERD (gastroesophageal reflux disease) Chronic and stable without reported incidences;plan-continue protonix   Benign paroxysmal positional vertigo No reported problems with vertigo since last visit;plan- continue daily antivert   Hyperlipidemia LDL goal <70 In 05/2014 LDL 94, HDL 29; A1c 6.6  , well controlled but with CVA hx and DM will start pt on Lipitor 10 mg;a low dose should do it   Depression Zoloft has been increased again, now at 150 mg qHs per psych for ongoing depression   Pruritic disorder Pt has rtiamcinolone ointment to use prn for nonspecific itching   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

## 2014-08-12 NOTE — Assessment & Plan Note (Signed)
Zoloft has been increased again, now at 150 mg qHs per psych for ongoing depression

## 2014-08-12 NOTE — Assessment & Plan Note (Signed)
Pt has rtiamcinolone ointment to use prn for nonspecific itching

## 2014-08-12 NOTE — Assessment & Plan Note (Signed)
In 05/2014 LDL 94, HDL 29; A1c 6.6 , well controlled but with CVA hx and DM will start pt on Lipitor 10 mg;a low dose should do it

## 2014-08-12 NOTE — Assessment & Plan Note (Signed)
Chronic and stable without reported incidences;plan-continue protonix

## 2014-08-12 NOTE — Assessment & Plan Note (Signed)
No reported problems with vertigo since last visit;plan- continue daily antivert

## 2014-08-12 NOTE — Assessment & Plan Note (Signed)
Chronic and stable with L side weakness;plan - cont plavix as prophylaxix, DM is well controlled and starting a statin

## 2014-08-27 LAB — HEMOGLOBIN A1C: HEMOGLOBIN A1C: 8.6 % — AB (ref 4.0–6.0)

## 2014-09-07 ENCOUNTER — Non-Acute Institutional Stay: Payer: BLUE CROSS/BLUE SHIELD | Admitting: Internal Medicine

## 2014-09-07 ENCOUNTER — Encounter: Payer: Self-pay | Admitting: Internal Medicine

## 2014-09-07 DIAGNOSIS — R69 Illness, unspecified: Secondary | ICD-10-CM | POA: Diagnosis not present

## 2014-09-07 NOTE — Progress Notes (Signed)
MRN: [id number] Name: Jeremiah Baldwin  Sex: male Age: 69 y.o. DOB: 01-Nov-1945  [place] #: [facility] Facility/Room:215 Level Of Care: SNF Provider: Inocencio Homes D Emergency Contacts: Extended Emergency Contact Information Primary Emergency Contact: Barrero,CAROLYN Address: [place],  [phone] Home Phone: [phone] Relation: None  Code Status: FULL  Allergies: Codeine  Chief Complaint  Patient presents with  . Acute Visit    HPI: Patient is 69 y.o. male who is being seen for need to review meds before prostate bx on 09/15/2014.  Past Medical History  Diagnosis Date  . Allergy   . Anorexia   . Hypertension   . Hyperlipidemia   . Diabetes mellitus without complication   . CAD (coronary artery disease)   . Colon polyp   . Stroke   . Left hemiparesis   . GERD (gastroesophageal reflux disease)   . PVD (peripheral vascular disease)   . Diabetic retinopathy     Past Surgical History  Procedure Laterality Date  . Left bka    . Enteroscopic laser photocoagulation Left 2001      Medication List       This list is accurate as of: 09/07/14  2:52 PM.  Always use your most recent med list.               clopidogrel 75 MG tablet  Commonly known as:  PLAVIX  Take 75 mg by mouth daily.     docusate sodium 100 MG capsule  Commonly known as:  COLACE  Take 300 mg by mouth 3 (three) times daily.     doxepin 25 MG capsule  Commonly known as:  SINEQUAN  Take 25 mg by mouth at bedtime.     enalapril 5 MG tablet  Commonly known as:  VASOTEC  Take 5 mg by mouth daily.     fluticasone 50 MCG/ACT nasal spray  Commonly known as:  FLONASE  Place 1 spray into both nostrils 2 (two) times daily as needed for allergies or rhinitis.     insulin detemir 100 UNIT/ML injection  Commonly known as:  LEVEMIR  Inject 15 Units into the skin daily.     meclizine 25 MG tablet  Commonly known as:  ANTIVERT  Take 25 mg by mouth 3 (three) times daily.      olopatadine 0.1 % ophthalmic solution  Commonly known as:  PATANOL  1 drop daily.     pantoprazole 40 MG tablet  Commonly known as:  PROTONIX  Take 40 mg by mouth daily.     sertraline 50 MG tablet  Commonly known as:  ZOLOFT  Take 150 mg by mouth daily.     triamcinolone ointment 0.1 %  Commonly known as:  KENALOG  Apply 1 application topically 2 (two) times daily as needed. R leg        No orders of the defined types were placed in this encounter.     There is no immunization history on file for this patient.  History  Substance Use Topics  . Smoking status: Never Smoker   . Smokeless tobacco: Not on file  . Alcohol Use: Not on file    Review of Systems  DATA OBTAINED: from patient, nurse GENERAL:  no fevers, fatigue, appetite changes SKIN: No itching, rash HEENT: No complaint RESPIRATORY: No cough, wheezing, SOB CARDIAC: No chest pain, palpitations, lower extremity edema  GI: No abdominal pain, No N/V/D or constipation, No  heartburn or reflux  GU: No dysuria, frequency or urgency, or incontinence  MUSCULOSKELETAL: No unrelieved bone/joint pain NEUROLOGIC: No headache, dizziness  PSYCHIATRIC: No overt anxiety or sadness  Filed Vitals:   09/07/14 1345  BP: 113/68  Pulse: 70  Temp: 97.7 F (36.5 C)  Resp: 20    Physical Exam  GENERAL APPEARANCE: Alert, conversant, No acute distress  SKIN: No diaphoresis rash, or wounds HEENT: Unremarkable RESPIRATORY: Breathing is even, unlabored. Lung sounds are clear   CARDIOVASCULAR: Heart RRR no murmurs, rubs or gallops. No peripheral edema  GASTROINTESTINAL: Abdomen is soft, non-tender, not distended w/ normal bowel sounds.  GENITOURINARY: Bladder non tender, not distended  MUSCULOSKELETAL: L AKA NEUROLOGIC: Cranial nerves 2-12 grossly intact; L side weakness PSYCHIATRIC: Mood and affect appropriate to situation, no behavioral issues  Patient Active Problem List   Diagnosis Date Noted  . Taking medication  for chronic disease 09/07/2014  . Leukocytosis, unspecified 03/03/2014  . Peripheral vascular disease 02/25/2014  . Conjunctivitis 02/25/2014  . Itching 10/23/2013  . Depression 04/17/2013  . Pruritic disorder 03/11/2013  . Urinary tract infection, site not specified 02/27/2013  . Pure hypercholesterolemia 11/29/2012  . Type I (juvenile type) diabetes mellitus with peripheral circulatory disorders, not stated as uncontrolled(250.71) 11/29/2012  . 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 Hyperlipidemia LDL goal <70 11/11/2012  . Loss of weight 11/11/2012  . Essential hypertension, benign 11/11/2012  . Unspecified constipation 11/11/2012  . Type 2 diabetes with complication 33/43/5686  . Benign paroxysmal positional vertigo 11/11/2012  . CVA (cerebral vascular accident) 11/11/2012  . Insomnia 11/11/2012  . GERD (gastroesophageal reflux disease) 11/11/2012    CBC    Component Value Date/Time   HGB 14.0 02/21/2014   HCT 40* 02/21/2014    CMP     Component Value Date/Time   BUN 19 02/21/2014   CREATININE 0.6 02/21/2014    Assessment and Plan  Taking medication for chronic disease Pt is on chronic plavix as prophylaxis for CVA with L side weakness and PVD. Pt is having a prostate biopsy 2/23 per [facility]., Dr Janice Norrie.Will plan to d/c plavix starting 2/16 for procedure on 2/23.     Hennie Duos, MD

## 2014-09-07 NOTE — Assessment & Plan Note (Signed)
Pt is on chronic plavix as prophylaxis for CVA with L side weakness and PVD. Pt is having a prostate biopsy 2/23 per [facility]., Dr Janice Norrie.Will plan to d/c plavix starting 2/16 for procedure on 2/23.

## 2014-09-16 ENCOUNTER — Non-Acute Institutional Stay: Payer: BLUE CROSS/BLUE SHIELD | Admitting: Internal Medicine

## 2014-09-16 ENCOUNTER — Encounter: Payer: Self-pay | Admitting: Internal Medicine

## 2014-09-16 DIAGNOSIS — R69 Illness, unspecified: Secondary | ICD-10-CM

## 2014-09-16 DIAGNOSIS — R338 Other retention of urine: Secondary | ICD-10-CM

## 2014-09-16 NOTE — Assessment & Plan Note (Signed)
In and out placed and bladder drained 1100 cc urine without blood.. Pt had prostate bx yesterday per my notes, but did not return with any paperwork or instructions from procedure. Contacted Dr Remi Deter office. Pt was not supposed to return with catheter but now that he has retained Dr. Kellie Simmering would like pt to have foley placed and left in for 5 days. Remove 2/28 and try voiding trial. Start pt on flomax 0.4 mg daily today. Pt to f/u in office 3/1.

## 2014-09-16 NOTE — Progress Notes (Signed)
MRN: [id number] Name: Jeremiah Baldwin  Sex: male Age: 69 y.o. DOB: 07-18-1946  [place] #: [facility] Facility/Room: 215W Level Of Care: SNF Provider: Inocencio Homes D Emergency Contacts: Extended Emergency Contact Information Primary Emergency Contact: Khurana,CAROLYN Address: [place],  [phone] Home Phone: [phone] Relation: None  Code Status: FULL  Allergies: Codeine  Chief Complaint  Patient presents with  . Acute Visit    HPI: Patient is 69 y.o. male who is being seen for urinary retention. Pt had a prostate bx yesterday.  Past Medical History  Diagnosis Date  . Allergy   . Anorexia   . Hypertension   . Hyperlipidemia   . Diabetes mellitus without complication   . CAD (coronary artery disease)   . Colon polyp   . Stroke   . Left hemiparesis   . GERD (gastroesophageal reflux disease)   . PVD (peripheral vascular disease)   . Diabetic retinopathy     Past Surgical History  Procedure Laterality Date  . Left bka    . Enteroscopic laser photocoagulation Left 2001      Medication List       This list is accurate as of: 09/16/14  7:15 PM.  Always use your most recent med list.               clopidogrel 75 MG tablet  Commonly known as:  PLAVIX  Take 75 mg by mouth daily.     docusate sodium 100 MG capsule  Commonly known as:  COLACE  Take 300 mg by mouth 3 (three) times daily.     doxepin 25 MG capsule  Commonly known as:  SINEQUAN  Take 25 mg by mouth at bedtime.     enalapril 5 MG tablet  Commonly known as:  VASOTEC  Take 5 mg by mouth daily.     fluticasone 50 MCG/ACT nasal spray  Commonly known as:  FLONASE  Place 1 spray into both nostrils 2 (two) times daily as needed for allergies or rhinitis.     insulin detemir 100 UNIT/ML injection  Commonly known as:  LEVEMIR  Inject 15 Units into the skin daily.     meclizine 25 MG tablet  Commonly known as:  ANTIVERT  Take 25 mg by mouth 3 (three) times daily.      olopatadine 0.1 % ophthalmic solution  Commonly known as:  PATANOL  1 drop daily.     pantoprazole 40 MG tablet  Commonly known as:  PROTONIX  Take 40 mg by mouth daily.     sertraline 50 MG tablet  Commonly known as:  ZOLOFT  Take 150 mg by mouth daily.     triamcinolone ointment 0.1 %  Commonly known as:  KENALOG  Apply 1 application topically 2 (two) times daily as needed. R leg        No orders of the defined types were placed in this encounter.     There is no immunization history on file for this patient.  History  Substance Use Topics  . Smoking status: Never Smoker   . Smokeless tobacco: Not on file  . Alcohol Use: Not on file    Review of Systems  DATA OBTAINED: from patient, nurse GENERAL:  no fevers, fatigue, appetite changes SKIN: No itching, rash HEENT: No complaint RESPIRATORY: No cough, wheezing, SOB CARDIAC: No chest pain, palpitations, lower extremity edema  GI: No abdominal pain, No N/V/D or constipation, No  heartburn or reflux  GU: hasn't peed in hours  MUSCULOSKELETAL: No unrelieved bone/joint pain NEUROLOGIC: No headache, dizziness  PSYCHIATRIC: No overt anxiety or sadness  Filed Vitals:   09/16/14 1014  BP: 113/68  Pulse: 70  Temp: 97.7 F (36.5 C)  Resp: 20    Physical Exam  GENERAL APPEARANCE: Alert, conversant, No acute distress  SKIN: No diaphoresis rash, or wounds HEENT: Unremarkable RESPIRATORY: Breathing is even, unlabored. Lung sounds are clear   CARDIOVASCULAR: Heart RRR no murmurs, rubs or gallops. No peripheral edema  GASTROINTESTINAL: Abdomen is soft, non-tender, not distended w/ normal bowel sounds.  GENITOURINARY: Bladder + tender, + distended  MUSCULOSKELETAL: No abnormal joints or musculature NEUROLOGIC: Cranial nerves 2-12 grossly intact PSYCHIATRIC: Mood and affect appropriate to situation, no behavioral issues  Patient Active Problem List   Diagnosis Date Noted  . Acute urinary retention 09/16/2014  .  Taking medication for chronic disease 09/07/2014  . Leukocytosis, unspecified 03/03/2014  . Peripheral vascular disease 02/25/2014  . Conjunctivitis 02/25/2014  . Itching 10/23/2013  . Depression 04/17/2013  . Pruritic disorder 03/11/2013  . Urinary tract infection, site not specified 02/27/2013  . Pure hypercholesterolemia 11/29/2012  . Type I (juvenile type) diabetes mellitus with peripheral circulatory disorders, not stated as uncontrolled(250.71) 11/29/2012  . 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 Hyperlipidemia LDL goal <70 11/11/2012  . Loss of weight 11/11/2012  . Essential hypertension, benign 11/11/2012  . Unspecified constipation 11/11/2012  . Type 2 diabetes with complication 37/48/2707  . Benign paroxysmal positional vertigo 11/11/2012  . CVA (cerebral vascular accident) 11/11/2012  . Insomnia 11/11/2012  . GERD (gastroesophageal reflux disease) 11/11/2012    CBC    Component Value Date/Time   HGB 14.0 02/21/2014   HCT 40* 02/21/2014    CMP     Component Value Date/Time   BUN 19 02/21/2014   CREATININE 0.6 02/21/2014    Assessment and Plan  Acute urinary retention In and out placed and bladder drained 1100 cc urine without blood.. Pt had prostate bx yesterday per my notes, but did not return with any paperwork or instructions from procedure. Contacted Dr Remi Deter office. Pt was not supposed to return with catheter but now that he has retained Dr. Kellie Simmering would like pt to have foley placed and left in for 5 days. Remove 2/28 and try voiding trial. Start pt on flomax 0.4 mg daily today. Pt to f/u in office 3/1.   Taking medication for chronic disease I stopped pt's plavix 7 days prior to 2/23, and will be restarted back tomorrow 2/25.     Hennie Duos, MD

## 2014-09-16 NOTE — Assessment & Plan Note (Signed)
I stopped pt's plavix 7 days prior to 2/23, and will be restarted back tomorrow 2/25.

## 2014-09-23 ENCOUNTER — Other Ambulatory Visit (HOSPITAL_COMMUNITY): Payer: Self-pay | Admitting: Urology

## 2014-09-23 DIAGNOSIS — C61 Malignant neoplasm of prostate: Secondary | ICD-10-CM

## 2014-10-01 ENCOUNTER — Non-Acute Institutional Stay: Payer: BLUE CROSS/BLUE SHIELD | Admitting: Internal Medicine

## 2014-10-01 DIAGNOSIS — I69959 Hemiplegia and hemiparesis following unspecified cerebrovascular disease affecting unspecified side: Secondary | ICD-10-CM

## 2014-10-01 DIAGNOSIS — R5081 Fever presenting with conditions classified elsewhere: Secondary | ICD-10-CM

## 2014-10-01 DIAGNOSIS — I10 Essential (primary) hypertension: Secondary | ICD-10-CM

## 2014-10-01 DIAGNOSIS — E118 Type 2 diabetes mellitus with unspecified complications: Secondary | ICD-10-CM | POA: Diagnosis not present

## 2014-10-01 NOTE — Progress Notes (Signed)
Patient ID: Jeremiah Baldwin, male   DOB: [birth year], 69 y.o.   MRN: [id number]   this is a routine-acute visit.  Level of care skilled.  [facility].   Chief Complaint  Patient presents with  . Medical Management of Chronic Issues--acute visit secondary to fever or unknown origin     HPI: Patient is 69 y.o. male who is being seen for routine issues as well as for a fever yesterday.  Apparently his temperature rose 201.3 yesterday this did come down with Tylenol-he is afebrile today he says he feels okay denies any fever or chills.  He has complained previously to nursing staff of some congestion and sore throat possible cough and according to nursing was more confused recently although this appears to be a bit better today.  He recently completed treatment for enterococcus UTI he was on Levaquin.  He is also seeing urology last month for a prostate biopsy-initially he had urinary retention and required a catheter this appears to have stabilized from what I can tell.  .  Past Medical History  Diagnosis Date  . Allergy   . Anorexia   . Hypertension   . Hyperlipidemia   . Diabetes mellitus without complication   . CAD (coronary artery disease)   . Colon polyp   . Stroke   . Left hemiparesis   . GERD (gastroesophageal reflux disease)   . PVD (peripheral vascular disease)   . Diabetic retinopathy     Past Surgical History  Procedure Laterality Date  . Left bka    . Enteroscopic laser photocoagulation Left 2001      Medication List                      clopidogrel 75 MG tablet  Commonly known as:  PLAVIX  Take 75 mg by mouth daily.     docusate sodium 100 MG capsule  Commonly known as:  COLACE  Take 300 mg by mouth 3 (three) times daily.     doxepin 25 MG capsule  Commonly known as:  SINEQUAN  Take 25 mg by mouth at bedtime.     enalapril 5 MG tablet  Commonly known as:  VASOTEC  Take 5 mg by mouth daily.     fluticasone 50 MCG/ACT nasal spray    Commonly known as:  FLONASE  Place 1 spray into both nostrils 2 (two) times daily as needed for allergies or rhinitis.     insulin detemir 100 UNIT/ML injection  Commonly known as:  LEVEMIR  Inject 15 Units into the skin daily.     meclizine 25 MG tablet  Commonly known as:  ANTIVERT  Take 25 mg by mouth 3 (three) times daily.     olopatadine 0.1 % ophthalmic solution  Commonly known as:  PATANOL  1 drop daily.     pantoprazole 40 MG tablet  Commonly known as:  PROTONIX  Take 40 mg by mouth daily.     sertraline 50 MG tablet  Commonly known as:  ZOLOFT  Take 150 mg by mouth daily.     triamcinolone ointment 0.1 %  Commonly known as:  KENALOG  Apply 1 application topically 2 (two) times daily as needed. R leg        Meds ordered this encounter  Medications  . sertraline (ZOLOFT) 50 MG tablet    Sig: Take 150 mg by mouth daily.  . fluticasone (FLONASE) 50 MCG/ACT nasal spray    Sig: Place 1 spray  into both nostrils 2 (two) times daily as needed for allergies or rhinitis.  Marland Kitchen triamcinolone ointment (KENALOG) 0.1 %    Sig: Apply 1 application topically 2 (two) times daily as needed. R leg  . olopatadine (PATANOL) 0.1 % ophthalmic solution    Sig: 1 drop daily.  Marland Kitchen doxepin (SINEQUAN) 25 MG capsule    Sig: Take 25 mg by mouth at bedtime.     There is no immunization history on file for this patient.  History  Substance Use Topics  . Smoking status: Never Smoker   . Smokeless tobacco: Not on file  . Alcohol Use: Not on file    Review of Systems  DATA OBTAINED: from patient, nurse, medical record GENERAL:  no fevers, fatigue, appetite changes--at a fever yesterday SKIN: No itching, rash HEENT: No complaint RESPIRATORY:?? Cough,  No wheezing, SOB CARDIAC: No chest pain, palpitations, lower extremity edema  GI: No abdominal pain, No N/V/D or constipation, No heartburn or reflux  GU: No dysuria, frequency or urgency, or incontinence  MUSCULOSKELETAL: No unrelieved  bone/joint pain NEUROLOGIC: No headache, dizziness  PSYCHIATRIC-smiling and pleasant today nursing staff has noted at times increased confusion                      Physical Exam Temperature 98.2 pulse 92 respirations 18 blood pressure 103/64-108/68 most recently GENERAL APPEARANCE: Alert,min conversant, No acute distress resting in bed comfortably  SKIN: No diaphoresis rash HEENT: Unremarkable oropharynx clear mucous membranes moist RESPIRATORY: Breathing is even, unlabored. Lung sounds are clear but shallow air entry  CARDIOVASCULAR: Heart RRR no murmurs, rubs or gallops. No peripheral edema  GASTROINTESTINAL: Abdomen is soft, non-tender, not distended w/ normal bowel sounds.  GENITOURINARY: Bladder non tender, not distended status post penal implant MUSCULOSKELETAL: L AKA;LUE contractures this is baseline NEUROLOGIC: Cranial nerves 2-12 grossly intact; L side weakness PSYCHIATRIC: Mood and affect appropriate to situation, no behavioral issues  Patient Active Problem List   Diagnosis Date Noted  . Leukocytosis, unspecified 03/03/2014  . Peripheral vascular disease 02/25/2014  . Conjunctivitis 02/25/2014  . Itching 10/23/2013  . Depression 04/17/2013  . Pruritic disorder 03/11/2013  . Urinary tract infection, site not specified 02/27/2013  . Pure hypercholesterolemia 11/29/2012  . Type I (juvenile type) diabetes mellitus with peripheral circulatory disorders, not stated as uncontrolled(250.71) 11/29/2012  . 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 Hyperlipidemia LDL goal <70 11/11/2012  . Loss of weight 11/11/2012  . Essential hypertension, benign 11/11/2012  . Unspecified constipation 11/11/2012  . Type 2 diabetes with complication 16/04/9603  . Benign paroxysmal positional vertigo 11/11/2012  . CVA (cerebral vascular accident) 11/11/2012  . Insomnia 11/11/2012  . GERD (gastroesophageal reflux disease) 11/11/2012   Labs.  08/27/2014.  Sodium 137 potassium 3.7 BUN 22 creatinine 0.5.  Heme ago been A1c 8.0.  WBC 11.6 hemoglobin 12.2 platelets 158.    CBC    Component Value Date/Time   HGB 14.0 02/21/2014   HCT 40* 02/21/2014    CMP     Component Value Date/Time   BUN 19 02/21/2014   CREATININE 0.6 02/21/2014    Assessment and Plan  Fever unknown origin-challenging situation-at this point patient appears stable one would be suspicious of a urinary tract infection with his history will check a urinalysis and culture also will check a chest x-ray and monitor closely vital signs pulse ox every shift for 72 hours.  Also obtain a CBC with differential and comprehensive metabolic  panel today.  I did discuss this with Dr. Sheppard Coil via phone at this point will hold starting antibiotic unless patient develops a recurrent fever will await laboratory and study results   Diabetes-recent hemoglobin A1c was 8.0 he is on Levemir 15 units at bedtime he gets blood sugars in the morning these appear to run mainly in the mid 100s occasionally somewhat above 200 but this is not persistent-will check CBGs before meals and at bedtime for now to get a little better handle on how he runs throughout the day and possibly may make some medication changes  Essential hypertension, benign Well controlled on vasotec 5mg ;plan-no change   GERD (gastroesophageal reflux disease) Chronic and stable without reported incidences;plan-continue protonix   Benign paroxysmal positional vertigo No reported problems with vertigo recently;plan- continue daily antivert   Hyperlipidemia LDL goal <70 In 05/2014 LDL 94, HDL 29; A1c 6.6 , well controlled but with CVA hx and DM wwas started on Lipitor 10 mg;   Depression Zoloft has been increased again, now at 150 mg qHs per psych for ongoing depression   Pruritic disorder Pt has rtiamcinolone ointment to use prn for nonspecific itching   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 and stable with L side weakness;plan - cont plavix as prophylaxix, DM is well controlled and starting a statin  Urinary issues he is followed by urology does have a history of elevated PSA with of 11.2 back in November-apparently a biopsy of his prostate was taken per review of Epic I do not see results this will be followed by urology  CPT 99310-of note greater than 40 minutes spent assessing patient-reviewing his chart-discussing his status with nursing staff-and coordinating and formulating a plan of care for numerous diagnoses-of note greater than 50% of time spent coordinating plan of care

## 2014-10-02 LAB — BASIC METABOLIC PANEL
BUN: 23 mg/dL — AB (ref 4–21)
GLUCOSE: 189 mg/dL
Potassium: 3.9 mmol/L (ref 3.4–5.3)
Sodium: 135 mmol/L — AB (ref 137–147)

## 2014-10-02 LAB — HEPATIC FUNCTION PANEL
ALT: 16 U/L (ref 10–40)
AST: 21 U/L (ref 14–40)

## 2014-10-02 LAB — LIPID PANEL
HDL: 33 mg/dL — AB (ref 35–70)
LDL CALC: 59 mg/dL
Triglycerides: 79 mg/dL (ref 40–160)

## 2014-10-04 ENCOUNTER — Encounter: Payer: Self-pay | Admitting: Internal Medicine

## 2014-10-04 DIAGNOSIS — R5081 Fever presenting with conditions classified elsewhere: Secondary | ICD-10-CM | POA: Insufficient documentation

## 2014-10-05 ENCOUNTER — Encounter (HOSPITAL_COMMUNITY): Payer: Self-pay

## 2014-10-05 ENCOUNTER — Ambulatory Visit (HOSPITAL_COMMUNITY): Payer: Self-pay

## 2014-10-08 ENCOUNTER — Encounter (HOSPITAL_COMMUNITY)
Admission: RE | Admit: 2014-10-08 | Discharge: 2014-10-08 | Disposition: A | Discharge status: Home / Self Care | Payer: BLUE CROSS/BLUE SHIELD | Source: Ambulatory Visit | Attending: Urology | Admitting: Urology

## 2014-10-08 DIAGNOSIS — C61 Malignant neoplasm of prostate: Secondary | ICD-10-CM | POA: Insufficient documentation

## 2014-10-08 MED ORDER — TECHNETIUM TC 99M MEDRONATE IV KIT
26.0000 | PACK | Freq: Once | INTRAVENOUS | Status: AC | PRN
  Administered 2014-10-08: 26 via INTRAVENOUS

## 2014-10-14 LAB — CBC AND DIFFERENTIAL
HCT: 37 % — AB (ref 41–53)
HEMOGLOBIN: 11.4 g/dL — AB (ref 13.5–17.5)
PLATELETS: 187 10*3/uL (ref 150–399)
WBC: 11.6 10*3/mL

## 2014-10-30 ENCOUNTER — Non-Acute Institutional Stay (SKILLED_NURSING_FACILITY): Payer: Medicare Other | Admitting: Internal Medicine

## 2014-10-30 ENCOUNTER — Encounter: Payer: Self-pay | Admitting: Internal Medicine

## 2014-10-30 DIAGNOSIS — C61 Malignant neoplasm of prostate: Secondary | ICD-10-CM | POA: Insufficient documentation

## 2014-10-30 NOTE — Progress Notes (Signed)
MRN: [id number] Name: Jeremiah Baldwin  Sex: male Age: 69 y.o. DOB: 07-Jun-1946  [place] #: [facility] Facility/Room: Level Of Care: SNF Provider: Inocencio Homes D Emergency Contacts: Extended Emergency Contact Information Primary Emergency Contact: Rump,CAROLYN Address: [place],  [phone] Home Phone: [phone] Relation: None  Code Status: FULL  Allergies: Codeine  Chief Complaint  Patient presents with  . Medical Management of Chronic Issues    HPI: Patient is 69 y.o. male who is being seen for a new issue.  Past Medical History  Diagnosis Date  . Allergy   . Anorexia   . Hypertension   . Hyperlipidemia   . Diabetes mellitus without complication   . CAD (coronary artery disease)   . Colon polyp   . Stroke   . Left hemiparesis   . GERD (gastroesophageal reflux disease)   . PVD (peripheral vascular disease)   . Diabetic retinopathy     Past Surgical History  Procedure Laterality Date  . Left bka    . Enteroscopic laser photocoagulation Left 2001      Medication List       This list is accurate as of: 10/30/14 11:59 PM.  Always use your most recent med list.               clopidogrel 75 MG tablet  Commonly known as:  PLAVIX  Take 75 mg by mouth daily.     docusate sodium 100 MG capsule  Commonly known as:  COLACE  Take 300 mg by mouth 3 (three) times daily.     doxepin 25 MG capsule  Commonly known as:  SINEQUAN  Take 25 mg by mouth at bedtime.     enalapril 5 MG tablet  Commonly known as:  VASOTEC  Take 5 mg by mouth daily.     fluticasone 50 MCG/ACT nasal spray  Commonly known as:  FLONASE  Place 1 spray into both nostrils 2 (two) times daily as needed for allergies or rhinitis.     insulin detemir 100 UNIT/ML injection  Commonly known as:  LEVEMIR  Inject 15 Units into the skin daily.     meclizine 25 MG tablet  Commonly known as:  ANTIVERT  Take 25 mg by mouth 3 (three) times daily.     olopatadine 0.1  % ophthalmic solution  Commonly known as:  PATANOL  1 drop daily.     pantoprazole 40 MG tablet  Commonly known as:  PROTONIX  Take 40 mg by mouth daily.     sertraline 50 MG tablet  Commonly known as:  ZOLOFT  Take 150 mg by mouth daily.     triamcinolone ointment 0.1 %  Commonly known as:  KENALOG  Apply 1 application topically 2 (two) times daily as needed. R leg        No orders of the defined types were placed in this encounter.     There is no immunization history on file for this patient.  History  Substance Use Topics  . Smoking status: Never Smoker   . Smokeless tobacco: Not on file  . Alcohol Use: Not on file    Review of Systems  DATA OBTAINED: from patient, nurse, medical record GENERAL:  no fevers, fatigue, appetite changes SKIN: No itching, rash HEENT: No complaint RESPIRATORY: No cough, wheezing, SOB CARDIAC: No chest pain, palpitations, lower extremity edema  GI: No abdominal pain, No N/V/D or constipation, No heartburn or  reflux  GU: No dysuria, frequency or urgency, or incontinence  MUSCULOSKELETAL: No unrelieved bone/joint pain NEUROLOGIC: No headache, dizziness  PSYCHIATRIC: No overt anxiety or sadness  Filed Vitals:   10/30/14 1134  BP: 113/68  Pulse: 80  Temp: 97.1 F (36.2 C)  Resp: 20    Physical Exam  GENERAL APPEARANCE: Alert, No acute distress  SKIN: No diaphoresis rash HEENT: Unremarkable RESPIRATORY: Breathing is even, unlabored. Lung sounds are clear   CARDIOVASCULAR: Heart RRR no murmurs, rubs or gallops. No peripheral edema  GASTROINTESTINAL: Abdomen is soft, non-tender, not distended w/ normal bowel sounds.  GENITOURINARY: Bladder non tender, not distended  MUSCULOSKELETAL: L AKA NEUROLOGIC: Cranial nerves 2-12 grossly intact PSYCHIATRIC: Mood and affect appropriate to situation, no behavioral issues  Patient Active Problem List   Diagnosis Date Noted  . Prostate cancer 10/30/2014  . Fever presenting with  conditions classified elsewhere 10/04/2014  . Acute urinary retention 09/16/2014  . Taking medication for chronic disease 09/07/2014  . Leukocytosis, unspecified 03/03/2014  . Peripheral vascular disease 02/25/2014  . Conjunctivitis 02/25/2014  . Itching 10/23/2013  . Depression 04/17/2013  . Pruritic disorder 03/11/2013  . Urinary tract infection, site not specified 02/27/2013  . Pure hypercholesterolemia 11/29/2012  . Type I (juvenile type) diabetes mellitus with peripheral circulatory disorders, not stated as uncontrolled(250.71) 11/29/2012  . 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 Hyperlipidemia LDL goal <70 11/11/2012  . Loss of weight 11/11/2012  . Essential hypertension, benign 11/11/2012  . Unspecified constipation 11/11/2012  . Type 2 diabetes with complication 62/70/3500  . Benign paroxysmal positional vertigo 11/11/2012  . CVA (cerebral vascular accident) 11/11/2012  . Insomnia 11/11/2012  . GERD (gastroesophageal reflux disease) 11/11/2012    CBC    Component Value Date/Time   HGB 14.0 02/21/2014   HCT 40* 02/21/2014    CMP     Component Value Date/Time   BUN 19 02/21/2014   CREATININE 0.6 02/21/2014    Assessment and Plan  Prostate cancer This is a NEW PROBLEM as of 09/2014. Pt was sent to urology, seen by Dr Janice Norrie,  for a PSA of 11.2. Plans were made for pt to have a bone scan and a CT/MRI to assess possible extraprostatic dx or retroperitoneal adenopathy. Pt has had the bone scan on 3/17. A copy was hunted down and was read as free of metastatic disease. Before a treatment plan can be made now a CT needs to be done. In the meantime pt has done has had one episode of urinary retention, that occurred after the biopsy that was resolved with a temporary foley and  flomax and has otherwise been without symptoms.     Hennie Duos, MD

## 2014-11-01 ENCOUNTER — Encounter: Payer: Self-pay | Admitting: Internal Medicine

## 2014-11-01 NOTE — Assessment & Plan Note (Addendum)
This is a NEW PROBLEM as of 09/2014. Pt was sent to urology, seen by Dr Janice Norrie,  for a PSA of 11.2. Plans were made for pt to have a bone scan and a CT/MRI to assess possible extraprostatic dx or retroperitoneal adenopathy. Pt has had the bone scan on 3/17. A copy was hunted down and was read as free of metastatic disease. Before a treatment plan can be made now a CT needs to be done. In the meantime pt  has had one episode of urinary retention, that occurred after the biopsy that was resolved with a temporary foley and  flomax and has otherwise been without symptoms.

## 2014-11-11 LAB — LIPID PANEL: Cholesterol: 107 mg/dL (ref 0–200)

## 2014-11-30 ENCOUNTER — Encounter: Payer: Self-pay | Admitting: *Deleted

## 2014-12-22 ENCOUNTER — Encounter: Payer: Self-pay | Admitting: Internal Medicine

## 2014-12-22 ENCOUNTER — Non-Acute Institutional Stay (SKILLED_NURSING_FACILITY): Payer: Medicare Other | Admitting: Internal Medicine

## 2014-12-22 DIAGNOSIS — E785 Hyperlipidemia, unspecified: Secondary | ICD-10-CM

## 2014-12-22 DIAGNOSIS — I10 Essential (primary) hypertension: Secondary | ICD-10-CM | POA: Diagnosis not present

## 2014-12-22 DIAGNOSIS — C61 Malignant neoplasm of prostate: Secondary | ICD-10-CM | POA: Diagnosis not present

## 2014-12-22 NOTE — Progress Notes (Signed)
MRN: [id number] Name: Jeremiah Baldwin  Sex: male Age: 69 y.o. DOB: 1945-10-01  [place] #: [facility] Facility/Room:215w Level Of Care: SNF Provider: Inocencio Homes D Emergency Contacts: Extended Emergency Contact Information Primary Emergency Contact: Gasparini,CAROLYN Address: [place],  [phone] Home Phone: [phone] Relation: None  Code Status: FULL  Allergies: Codeine  Chief Complaint  Patient presents with  . Medical Management of Chronic Issues    HPI: Patient is 69 y.o. male who is being seen for routine issues.  Past Medical History  Diagnosis Date  . Allergy   . Anorexia   . Hypertension   . Hyperlipidemia   . Diabetes mellitus without complication   . CAD (coronary artery disease)   . Colon polyp   . Stroke   . Left hemiparesis   . GERD (gastroesophageal reflux disease)   . PVD (peripheral vascular disease)   . Diabetic retinopathy     Past Surgical History  Procedure Laterality Date  . Left bka    . Enteroscopic laser photocoagulation Left 2001      Medication List       This list is accurate as of: 12/22/14 11:59 PM.  Always use your most recent med list.               acetaminophen 325 MG tablet  Commonly known as:  TYLENOL  Take 650 mg by mouth every 6 (six) hours as needed for mild pain or moderate pain.     atorvastatin 10 MG tablet  Commonly known as:  LIPITOR  Take 10 mg by mouth at bedtime.     buPROPion 150 MG 12 hr tablet  Commonly known as:  WELLBUTRIN SR  Take 150 mg by mouth daily. For depression     clopidogrel 75 MG tablet  Commonly known as:  PLAVIX  Take 75 mg by mouth daily.     docusate sodium 100 MG capsule  Commonly known as:  COLACE  Take 300 mg by mouth 3 (three) times daily.     enalapril 5 MG tablet  Commonly known as:  VASOTEC  Take 5 mg by mouth daily.     fluticasone 50 MCG/ACT nasal spray  Commonly known as:  FLONASE  Place 1 spray into both nostrils 2 (two) times daily  as needed for allergies or rhinitis.     insulin detemir 100 UNIT/ML injection  Commonly known as:  LEVEMIR  Inject 15 Units into the skin at bedtime. For diabetes     meclizine 25 MG tablet  Commonly known as:  ANTIVERT  Take 25 mg by mouth 3 (three) times daily. For dizziness     olopatadine 0.1 % ophthalmic solution  Commonly known as:  PATANOL  Place 1 drop into both eyes daily.     pantoprazole 40 MG tablet  Commonly known as:  PROTONIX  Take 40 mg by mouth daily. For reflux     sertraline 100 MG tablet  Commonly known as:  ZOLOFT  Take 100 mg by mouth daily. Give 1.5 tablets by mouth daily for depression.     tamsulosin 0.4 MG Caps capsule  Commonly known as:  FLOMAX  Take 0.4 mg by mouth at bedtime.     triamcinolone ointment 0.1 %  Commonly known as:  KENALOG  Apply 1 application topically 2 (two) times daily as needed. R leg        No orders of the defined types  were placed in this encounter.    Immunization History  Administered Date(s) Administered  . PPD Test 05/12/2009    History  Substance Use Topics  . Smoking status: Never Smoker   . Smokeless tobacco: Not on file  . Alcohol Use: Not on file    Review of Systems  DATA OBTAINED: from patient, nurse GENERAL:  no fevers, fatigue, appetite changes SKIN: No itching, rash HEENT: No complaint RESPIRATORY: No cough, wheezing, SOB CARDIAC: No chest pain, palpitations, lower extremity edema  GI: No abdominal pain, No N/V/D or constipation, No heartburn or reflux  GU: No dysuria, frequency or urgency, or incontinence  MUSCULOSKELETAL: No unrelieved bone/joint pain NEUROLOGIC: No headache, dizziness  PSYCHIATRIC: No overt anxiety or sadness  Filed Vitals:   12/22/14 2216  BP: 108/70  Pulse: 74  Temp: 96.5 F (35.8 C)  Resp: 18    Physical Exam  GENERAL APPEARANCE: Alert, WM No acute distress  SKIN: No diaphoresis rash HEENT: Unremarkable RESPIRATORY: Breathing is even, unlabored. Lung  sounds are clear   CARDIOVASCULAR: Heart RRR no murmurs, rubs or gallops. No peripheral edema  GASTROINTESTINAL: Abdomen is soft, non-tender, not distended w/ normal bowel sounds.  GENITOURINARY: Bladder non tender, not distended  MUSCULOSKELETAL: L AKA NEUROLOGIC: Cranial nerves 2-12 grossly intact. L side weakness PSYCHIATRIC: Mood and affect appropriate to situation, no behavioral issues  Patient Active Problem List   Diagnosis Date Noted  . Prostate cancer 10/30/2014  . Fever presenting with conditions classified elsewhere 10/04/2014  . Acute urinary retention 09/16/2014  . Taking medication for chronic disease 09/07/2014  . Leukocytosis, unspecified 03/03/2014  . Peripheral vascular disease 02/25/2014  . Conjunctivitis 02/25/2014  . Itching 10/23/2013  . Depression 04/17/2013  . Pruritic disorder 03/11/2013  . Urinary tract infection, site not specified 02/27/2013  . Pure hypercholesterolemia 11/29/2012  . Type I (juvenile type) diabetes mellitus with peripheral circulatory disorders, not stated as uncontrolled(250.71) 11/29/2012  . 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 Hyperlipidemia LDL goal <70 11/11/2012  . Loss of weight 11/11/2012  . Essential hypertension, benign 11/11/2012  . Unspecified constipation 11/11/2012  . Type 2 diabetes with complication 16/04/9603  . Benign paroxysmal positional vertigo 11/11/2012  . CVA (cerebral vascular accident) 11/11/2012  . Insomnia 11/11/2012  . 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 CT pelvis and abd   Essential hypertension, benign No change, controlled on vasotec 5 mg daily   Hyperlipidemia LDL goal <70 On lipitor 10 mg in 09/2014 LDL 59 and HDL 33 at goal;plan - conti nuelipitor 10 mg     Hennie Duos, MD

## 2014-12-23 NOTE — Assessment & Plan Note (Addendum)
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 CT pelvis and abd was scheduled 5/16 and was cancelled. There is an insurance problem which the Network engineer says wife is working on.

## 2014-12-23 NOTE — Assessment & Plan Note (Signed)
No change, controlled on vasotec 5 mg daily

## 2014-12-23 NOTE — Assessment & Plan Note (Addendum)
On lipitor 10 mg in 09/2014 LDL 59 and HDL 33 at goal;plan - continue lipitor 10 mg

## 2015-01-13 ENCOUNTER — Other Ambulatory Visit (HOSPITAL_COMMUNITY): Payer: Self-pay | Admitting: Urology

## 2015-01-13 ENCOUNTER — Non-Acute Institutional Stay (SKILLED_NURSING_FACILITY): Payer: Medicare Other | Admitting: Internal Medicine

## 2015-01-13 ENCOUNTER — Encounter: Payer: Self-pay | Admitting: Internal Medicine

## 2015-01-13 DIAGNOSIS — C61 Malignant neoplasm of prostate: Secondary | ICD-10-CM

## 2015-01-13 DIAGNOSIS — E118 Type 2 diabetes mellitus with unspecified complications: Secondary | ICD-10-CM

## 2015-01-13 DIAGNOSIS — R9389 Abnormal findings on diagnostic imaging of other specified body structures: Secondary | ICD-10-CM

## 2015-01-13 NOTE — Assessment & Plan Note (Signed)
Finally have MRI pelvis set up for 01/22/2015.

## 2015-01-13 NOTE — Assessment & Plan Note (Addendum)
In 10/2014 A1c was 8.4 on same dose insulin as in 01/2014 when A1c was 6.6. Changing to toujeo 15 u qHS and CBG 4 times a day in preparation for covering mealtime. Fasting BS 120-130. Pt on statin and ACE

## 2015-01-13 NOTE — Progress Notes (Signed)
MRN: [id number] Name: Jeremiah Baldwin  Sex: male Age: 69 y.o. DOB: 09-Dec-1945  [place] #: [facility] Facility/Room:215W Level Of Care: SNF Provider: Inocencio Homes D Emergency Contacts: Extended Emergency Contact Information Primary Emergency Contact: Dains,CAROLYN Address: [place],  [phone] Home Phone: [phone] Relation: None  Code Status: FULL.  Allergies: Codeine  Chief Complaint  Patient presents with  . Medical Management of Chronic Issues    HPI: Patient is 69 y.o. male who is being seen for routine issues.  Past Medical History  Diagnosis Date  . Allergy   . Anorexia   . Hypertension   . Hyperlipidemia   . Diabetes mellitus without complication   . CAD (coronary artery disease)   . Colon polyp   . Stroke   . Left hemiparesis   . GERD (gastroesophageal reflux disease)   . PVD (peripheral vascular disease)   . Diabetic retinopathy     Past Surgical History  Procedure Laterality Date  . Left bka    . Enteroscopic laser photocoagulation Left 2001      Medication List       This list is accurate as of: 01/13/15  4:48 PM.  Always use your most recent med list.               acetaminophen 325 MG tablet  Commonly known as:  TYLENOL  Take 650 mg by mouth every 6 (six) hours as needed for mild pain or moderate pain.     atorvastatin 10 MG tablet  Commonly known as:  LIPITOR  Take 10 mg by mouth at bedtime.     buPROPion 150 MG 12 hr tablet  Commonly known as:  WELLBUTRIN SR  Take 150 mg by mouth daily. For depression     clopidogrel 75 MG tablet  Commonly known as:  PLAVIX  Take 75 mg by mouth daily.     docusate sodium 100 MG capsule  Commonly known as:  COLACE  Take 300 mg by mouth 3 (three) times daily.     enalapril 5 MG tablet  Commonly known as:  VASOTEC  Take 5 mg by mouth daily.     fluticasone 50 MCG/ACT nasal spray  Commonly known as:  FLONASE  Place 1 spray into both nostrils 2 (two) times daily  as needed for allergies or rhinitis.     insulin detemir 100 UNIT/ML injection  Commonly known as:  LEVEMIR  Inject 15 Units into the skin at bedtime. For diabetes     meclizine 25 MG tablet  Commonly known as:  ANTIVERT  Take 25 mg by mouth 3 (three) times daily. For dizziness     olopatadine 0.1 % ophthalmic solution  Commonly known as:  PATANOL  Place 1 drop into both eyes daily.     pantoprazole 40 MG tablet  Commonly known as:  PROTONIX  Take 40 mg by mouth daily. For reflux     sertraline 100 MG tablet  Commonly known as:  ZOLOFT  Take 100 mg by mouth daily. Give 1.5 tablets by mouth daily for depression.     tamsulosin 0.4 MG Caps capsule  Commonly known as:  FLOMAX  Take 0.4 mg by mouth at bedtime.     triamcinolone ointment 0.1 %  Commonly known as:  KENALOG  Apply 1 application topically 2 (two) times daily as needed. R leg        No orders of the defined  types were placed in this encounter.    Immunization History  Administered Date(s) Administered  . PPD Test 05/12/2009    History  Substance Use Topics  . Smoking status: Never Smoker   . Smokeless tobacco: Not on file  . Alcohol Use: Not on file    Review of Systems  DATA OBTAINED: from patient, nurse GENERAL:  no fevers, fatigue, appetite changes; pt with no c/o or needs SKIN: No itching, rash HEENT: No complaint RESPIRATORY: No cough, wheezing, SOB CARDIAC: No chest pain, palpitations, lower extremity edema  GI: No abdominal pain, No N/V/D or constipation, No heartburn or reflux  GU: No dysuria, frequency or urgency, or incontinence  MUSCULOSKELETAL: No unrelieved bone/joint pain NEUROLOGIC: No headache, dizziness  PSYCHIATRIC: No overt anxiety or sadness  Filed Vitals:   01/13/15 1630  BP: 108/70  Pulse: 79  Temp: 98.5 F (36.9 C)  Resp: 20    Physical Exam  GENERAL APPEARANCE: Alert, conversant, No acute distress  SKIN: No diaphoresis rash HEENT: Unremarkable RESPIRATORY:  Breathing is even, unlabored. Lung sounds are clear   CARDIOVASCULAR: Heart RRR no murmurs, rubs or gallops. No peripheral edema  GASTROINTESTINAL: Abdomen is soft, non-tender, not distended w/ normal bowel sounds.  GENITOURINARY: Bladder non tender, not distended  MUSCULOSKELETAL: L AKA;wasting RLE NEUROLOGIC: Cranial nerves 2-12 grossly intact PSYCHIATRIC: Mood and affect appropriate to situation, no behavioral issues  Patient Active Problem List   Diagnosis Date Noted  . Prostate cancer 10/30/2014  . Fever presenting with conditions classified elsewhere 10/04/2014  . Acute urinary retention 09/16/2014  . Taking medication for chronic disease 09/07/2014  . Leukocytosis, unspecified 03/03/2014  . Peripheral vascular disease 02/25/2014  . Conjunctivitis 02/25/2014  . Itching 10/23/2013  . Depression 04/17/2013  . Pruritic disorder 03/11/2013  . Urinary tract infection, site not specified 02/27/2013  . Pure hypercholesterolemia 11/29/2012  . Type I (juvenile type) diabetes mellitus with peripheral circulatory disorders, not stated as uncontrolled(250.71) 11/29/2012  . 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 Hyperlipidemia LDL goal <70 11/11/2012  . Loss of weight 11/11/2012  . Essential hypertension, benign 11/11/2012  . Unspecified constipation 11/11/2012  . Type 2 diabetes with complication 16/01/3709  . Benign paroxysmal positional vertigo 11/11/2012  . CVA (cerebral vascular accident) 11/11/2012  . Insomnia 11/11/2012  . GERD (gastroesophageal reflux disease) 11/11/2012    CBC    Component Value Date/Time   WBC 11.6 10/14/2014   HGB 11.4* 10/14/2014   HCT 37* 10/14/2014   PLT 187 10/14/2014    CMP     Component Value Date/Time   NA 135* 10/02/2014   K 3.9 10/02/2014   BUN 23* 10/02/2014   CREATININE 0.6 02/21/2014   AST 21 10/02/2014   ALT 16 10/02/2014    Assessment and Plan  Type 2 diabetes with complication In  12/2692 W5I was 8.4 on same dose insulin as in 01/2014 when A1c was 6.6. Changing to toujeo 15 u qHS and CBG 4 times a day in preparation for covering mealtime. Fasting BS 120-130. Pt on statin and ACE  Prostate cancer Finally have MRI pelvis set up for 01/22/2015.    Hennie Duos, MD

## 2015-01-22 ENCOUNTER — Ambulatory Visit (HOSPITAL_COMMUNITY)
Admission: RE | Admit: 2015-01-22 | Discharge: 2015-01-22 | Disposition: A | Discharge status: Home / Self Care | Payer: Medicare Other | Source: Ambulatory Visit | Attending: Urology | Admitting: Urology

## 2015-01-22 DIAGNOSIS — R9389 Abnormal findings on diagnostic imaging of other specified body structures: Secondary | ICD-10-CM

## 2015-02-16 ENCOUNTER — Encounter: Payer: Self-pay | Admitting: Internal Medicine

## 2015-02-16 ENCOUNTER — Non-Acute Institutional Stay (SKILLED_NURSING_FACILITY): Payer: Medicare Other | Admitting: Internal Medicine

## 2015-02-16 DIAGNOSIS — F329 Major depressive disorder, single episode, unspecified: Secondary | ICD-10-CM | POA: Diagnosis not present

## 2015-02-16 DIAGNOSIS — K219 Gastro-esophageal reflux disease without esophagitis: Secondary | ICD-10-CM

## 2015-02-16 DIAGNOSIS — F32A Depression, unspecified: Secondary | ICD-10-CM

## 2015-02-16 DIAGNOSIS — I69959 Hemiplegia and hemiparesis following unspecified cerebrovascular disease affecting unspecified side: Secondary | ICD-10-CM | POA: Diagnosis not present

## 2015-02-16 NOTE — Assessment & Plan Note (Signed)
Pt is tolerating both meds and mood has been stable;Plan - continue zoloft 150 mg daily and wellbutrin SR 150 mg daily

## 2015-02-16 NOTE — Assessment & Plan Note (Signed)
Chronic and stable;Plan - conr plavix as prophylaxis, , lipitor 10 mg, DM last was stable

## 2015-02-16 NOTE — Assessment & Plan Note (Signed)
No reports of reflux, heartburn or vomiting;Plan - cont protonix 40 mg daily

## 2015-02-16 NOTE — Progress Notes (Signed)
MRN: [id number] Name: Jeremiah Baldwin  Sex: male Age: 69 y.o. DOB: 03-08-1946  [place] #: [facility] Facility/Room: Level Of Care: SNF Provider: Inocencio Homes D Emergency Contacts: Extended Emergency Contact Information Primary Emergency Contact: Jentz,CAROLYN Address: [place],  [phone] Home Phone: [phone] Relation: None  Code Status: FULL  Allergies: Codeine  Chief Complaint  Patient presents with  . Medical Management of Chronic Issues    HPI: Patient is 69 y.o. male who is being seen for depression, GERD and CVA with hemiplegia.  Past Medical History  Diagnosis Date  . Allergy   . Anorexia   . Hypertension   . Hyperlipidemia   . Diabetes mellitus without complication   . CAD (coronary artery disease)   . Colon polyp   . Stroke   . Left hemiparesis   . GERD (gastroesophageal reflux disease)   . PVD (peripheral vascular disease)   . Diabetic retinopathy     Past Surgical History  Procedure Laterality Date  . Left bka    . Enteroscopic laser photocoagulation Left 2001      Medication List       This list is accurate as of: 02/16/15  9:04 PM.  Always use your most recent med list.               acetaminophen 325 MG tablet  Commonly known as:  TYLENOL  Take 650 mg by mouth every 6 (six) hours as needed for mild pain or moderate pain.     atorvastatin 10 MG tablet  Commonly known as:  LIPITOR  Take 10 mg by mouth at bedtime.     buPROPion 150 MG 12 hr tablet  Commonly known as:  WELLBUTRIN SR  Take 150 mg by mouth daily. For depression     clopidogrel 75 MG tablet  Commonly known as:  PLAVIX  Take 75 mg by mouth daily.     docusate sodium 100 MG capsule  Commonly known as:  COLACE  Take 300 mg by mouth 3 (three) times daily.     enalapril 5 MG tablet  Commonly known as:  VASOTEC  Take 5 mg by mouth daily.     fluticasone 50 MCG/ACT nasal spray  Commonly known as:  FLONASE  Place 1 spray into both  nostrils 2 (two) times daily as needed for allergies or rhinitis.     insulin detemir 100 UNIT/ML injection  Commonly known as:  LEVEMIR  Inject 15 Units into the skin at bedtime. For diabetes     meclizine 25 MG tablet  Commonly known as:  ANTIVERT  Take 25 mg by mouth 3 (three) times daily. For dizziness     olopatadine 0.1 % ophthalmic solution  Commonly known as:  PATANOL  Place 1 drop into both eyes daily.     pantoprazole 40 MG tablet  Commonly known as:  PROTONIX  Take 40 mg by mouth daily. For reflux     sertraline 100 MG tablet  Commonly known as:  ZOLOFT  Take 100 mg by mouth daily. Give 1.5 tablets by mouth daily for depression.     tamsulosin 0.4 MG Caps capsule  Commonly known as:  FLOMAX  Take 0.4 mg by mouth at bedtime.     triamcinolone ointment 0.1 %  Commonly known as:  KENALOG  Apply 1 application topically 2 (two) times daily as needed. R leg        No  orders of the defined types were placed in this encounter.    Immunization History  Administered Date(s) Administered  . PPD Test 05/12/2009    History  Substance Use Topics  . Smoking status: Never Smoker   . Smokeless tobacco: Not on file  . Alcohol Use: Not on file    Review of Systems  DATA OBTAINED: from patient; pt has no c/o, he hardly ever c/o GENERAL:  no fevers, fatigue, appetite changes SKIN: No itching, rash HEENT: No complaint RESPIRATORY: No cough, wheezing, SOB CARDIAC: No chest pain, palpitations, lower extremity edema  GI: No abdominal pain, No N/V/D or constipation, No heartburn or reflux  GU: No dysuria, frequency or urgency, or incontinence  MUSCULOSKELETAL: No unrelieved bone/joint pain NEUROLOGIC: No headache, dizziness  PSYCHIATRIC: No overt anxiety or sadness  Filed Vitals:   02/16/15 1633  BP: 108/70  Pulse: 80  Temp: 96.5 F (35.8 C)  Resp: 20    Physical Exam  GENERAL APPEARANCE: Alert, quiet, conversant,WM  No acute distress  SKIN: No diaphoresis  rash HEENT: Unremarkable RESPIRATORY: Breathing is even, unlabored. Lung sounds are clear   CARDIOVASCULAR: Heart RRR no murmurs, rubs or gallops. No peripheral edema  GASTROINTESTINAL: Abdomen is soft, non-tender, not distended w/ normal bowel sounds.  GENITOURINARY: Bladder non tender, not distended  MUSCULOSKELETAL:L AKA NEUROLOGIC: Cranial nerves 2-12 grossly intact, L side weakness PSYCHIATRIC: Mood and affect appropriate to situation, no behavioral issues  Patient Active Problem List   Diagnosis Date Noted  . Prostate cancer 10/30/2014  . Fever presenting with conditions classified elsewhere 10/04/2014  . Acute urinary retention 09/16/2014  . Taking medication for chronic disease 09/07/2014  . Leukocytosis, unspecified 03/03/2014  . Peripheral vascular disease 02/25/2014  . Conjunctivitis 02/25/2014  . Itching 10/23/2013  . Depression 04/17/2013  . Pruritic disorder 03/11/2013  . Urinary tract infection, site not specified 02/27/2013  . Pure hypercholesterolemia 11/29/2012  . Type I (juvenile type) diabetes mellitus with peripheral circulatory disorders, not stated as uncontrolled(250.71) 11/29/2012  . 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 Hyperlipidemia LDL goal <70 11/11/2012  . Loss of weight 11/11/2012  . Essential hypertension, benign 11/11/2012  . Unspecified constipation 11/11/2012  . Type 2 diabetes with complication 88/50/2774  . Benign paroxysmal positional vertigo 11/11/2012  . CVA (cerebral vascular accident) 11/11/2012  . Insomnia 11/11/2012  . 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

## 2015-03-17 ENCOUNTER — Encounter: Payer: Self-pay | Admitting: Internal Medicine

## 2015-03-17 ENCOUNTER — Non-Acute Institutional Stay (SKILLED_NURSING_FACILITY): Payer: Medicare Other | Admitting: Internal Medicine

## 2015-03-17 DIAGNOSIS — E118 Type 2 diabetes mellitus with unspecified complications: Secondary | ICD-10-CM

## 2015-03-17 DIAGNOSIS — I10 Essential (primary) hypertension: Secondary | ICD-10-CM

## 2015-03-17 DIAGNOSIS — E785 Hyperlipidemia, unspecified: Secondary | ICD-10-CM

## 2015-03-17 NOTE — Assessment & Plan Note (Signed)
Plan - obtaing FLP in am;lipids have been stable on 10mg  lipitor

## 2015-03-17 NOTE — Assessment & Plan Note (Signed)
Plan- cont vasotec;well controlled

## 2015-03-17 NOTE — Assessment & Plan Note (Addendum)
Reviewed Pt's BS for month of August. Fasting BS are closer to 200 now and most between 200-300 with no outliers.Pt eats breakfast and dinner, wife brings it from out of facility and it is not diabetic food. Pt hates being stuck. Ive looked in record for why pt isn't on glucophage or januvia. Nothing and nurse not aware of problems.I would like to get CBG checks to BID, then daily. Plan - continue toujeo 15 u qHS and start glucophage 500 mg BID; will obtain A1c today for baseline; cont statin and ACE

## 2015-03-17 NOTE — Progress Notes (Signed)
MRN: [id number] Name: Jeremiah Baldwin  Sex: male Age: 69 y.o. DOB: 1945/09/19  [place] #:[facility]  Facility/Room:215 Level Of Care: SNF Provider: Inocencio Homes D Emergency Contacts: Extended Emergency Contact Information Primary Emergency Contact: Mandella,CAROLYN Address: [place],  [phone] Home Phone: [phone] Relation: None  Code Status:   Allergies: Codeine  Chief Complaint  Patient presents with  . Medical Management of Chronic Issues    HPI: Patient is 69 y.o. male who is being seen for DM2,HTN and HLD. Pt is pleasant and not unhappy but stays in his bed and can't be coaxed out of it.  Past Medical History  Diagnosis Date  . Allergy   . Anorexia   . Hypertension   . Hyperlipidemia   . Diabetes mellitus without complication   . CAD (coronary artery disease)   . Colon polyp   . Stroke   . Left hemiparesis   . GERD (gastroesophageal reflux disease)   . PVD (peripheral vascular disease)   . Diabetic retinopathy     Past Surgical History  Procedure Laterality Date  . Left bka    . Enteroscopic laser photocoagulation Left 2001      Medication List       This list is accurate as of: 03/17/15 11:39 AM.  Always use your most recent med list.               acetaminophen 325 MG tablet  Commonly known as:  TYLENOL  Take 650 mg by mouth every 6 (six) hours as needed for mild pain or moderate pain.     atorvastatin 10 MG tablet  Commonly known as:  LIPITOR  Take 10 mg by mouth at bedtime.     buPROPion 150 MG 12 hr tablet  Commonly known as:  WELLBUTRIN SR  Take 150 mg by mouth daily. For depression     clopidogrel 75 MG tablet  Commonly known as:  PLAVIX  Take 75 mg by mouth daily.     docusate sodium 100 MG capsule  Commonly known as:  COLACE  Take 300 mg by mouth 3 (three) times daily.     enalapril 5 MG tablet  Commonly known as:  VASOTEC  Take 5 mg by mouth daily.     fluticasone 50 MCG/ACT nasal spray   Commonly known as:  FLONASE  Place 1 spray into both nostrils 2 (two) times daily as needed for allergies or rhinitis.     insulin detemir 100 UNIT/ML injection  Commonly known as:  LEVEMIR  Inject 15 Units into the skin at bedtime. For diabetes     meclizine 25 MG tablet  Commonly known as:  ANTIVERT  Take 25 mg by mouth 3 (three) times daily. For dizziness     olopatadine 0.1 % ophthalmic solution  Commonly known as:  PATANOL  Place 1 drop into both eyes daily.     pantoprazole 40 MG tablet  Commonly known as:  PROTONIX  Take 40 mg by mouth daily. For reflux     sertraline 100 MG tablet  Commonly known as:  ZOLOFT  Take 100 mg by mouth daily. Give 1.5 tablets by mouth daily for depression.     tamsulosin 0.4 MG Caps capsule  Commonly known as:  FLOMAX  Take 0.4 mg by mouth at bedtime.     triamcinolone ointment 0.1 %  Commonly known as:  KENALOG  Apply 1 application topically 2 (two)  times daily as needed. R leg        No orders of the defined types were placed in this encounter.    Immunization History  Administered Date(s) Administered  . PPD Test 05/12/2009    Social History  Substance Use Topics  . Smoking status: Never Smoker   . Smokeless tobacco: Not on file  . Alcohol Use: Not on file    Review of Systems  DATA OBTAINED: from patient, nurse GENERAL:  no fevers, fatigue, appetite changes SKIN: No itching, rash HEENT: No complaint RESPIRATORY: No cough, wheezing, SOB CARDIAC: No chest pain, palpitations, lower extremity edema  GI: No abdominal pain, No N/V/D or constipation, No heartburn or reflux  GU: No dysuria, frequency or urgency, or incontinence  MUSCULOSKELETAL: No unrelieved bone/joint pain NEUROLOGIC: No headache, dizziness  PSYCHIATRIC: No overt anxiety or sadness  Filed Vitals:   03/17/15 1100  BP: 108/70  Pulse: 82  Temp: 96.5 F (35.8 C)  Resp: 18    Physical Exam  GENERAL APPEARANCE: Alert, conversant, No acute distress   SKIN: No diaphoresis rash HEENT: Unremarkable RESPIRATORY: Breathing is even, unlabored. Lung sounds are clear   CARDIOVASCULAR: Heart RRR no murmurs, rubs or gallops. No peripheral edema  GASTROINTESTINAL: Abdomen is soft, non-tender, not distended w/ normal bowel sounds.  GENITOURINARY: Bladder non tender, not distended  MUSCULOSKELETAL: No abnormal joints or musculature NEUROLOGIC: Cranial nerves 2-12 grossly intact; L side hemiparesis PSYCHIATRIC: Mood and affect appropriate to situation, no behavioral issues  Patient Active Problem List   Diagnosis Date Noted  . Prostate cancer 10/30/2014  . Fever presenting with conditions classified elsewhere 10/04/2014  . Acute urinary retention 09/16/2014  . Taking medication for chronic disease 09/07/2014  . Leukocytosis, unspecified 03/03/2014  . Peripheral vascular disease 02/25/2014  . Conjunctivitis 02/25/2014  . Itching 10/23/2013  . Depression 04/17/2013  . Pruritic disorder 03/11/2013  . Urinary tract infection, site not specified 02/27/2013  . Pure hypercholesterolemia 11/29/2012  . Type I (juvenile type) diabetes mellitus with peripheral circulatory disorders, not stated as uncontrolled(250.71) 11/29/2012  . 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 Hyperlipidemia LDL goal <70 11/11/2012  . Loss of weight 11/11/2012  . Essential hypertension, benign 11/11/2012  . Unspecified constipation 11/11/2012  . Type 2 diabetes with complication 57/32/2025  . Benign paroxysmal positional vertigo 11/11/2012  . CVA (cerebral vascular accident) 11/11/2012  . Insomnia 11/11/2012  . GERD (gastroesophageal reflux disease) 11/11/2012    CBC    Component Value Date/Time   WBC 11.6 10/14/2014   HGB 11.4* 10/14/2014   HCT 37* 10/14/2014   PLT 187 10/14/2014    CMP     Component Value Date/Time   NA 135* 10/02/2014   K 3.9 10/02/2014   BUN 23* 10/02/2014   CREATININE 0.6 02/21/2014   AST 21  10/02/2014   ALT 16 10/02/2014    Assessment and Plan  Type 2 diabetes with complication Reviewed Pt's BS for month of August. Fasting BS are closer to 200 now and most between 200-300 with no outliers.Pt eats breakfast and dinner, wife brings it from out of facility and it is not diabetic food. Pt hates being stuck. Ive looked in record for why pt isn't on glucophage or januvia. Nothing and nurse not aware of problems.I would like to get CBG checks to BID, then daily. Plan - continue toujeo 15 u qHS and start glucophage 500 mg BID; will obtain A1c today for baseline; cont statin and ACE  Hyperlipidemia LDL goal <70 Plan - obtaing FLP in am;lipids have been stable on 10mg  lipitor  Essential hypertension, benign Plan- cont vasotec;well controlled   Time spent > 35 mon > 50% of time with patient was spent reviewing records, labs, tests and studies, counseling and developing plan of care  Hennie Duos, MD

## 2015-04-23 ENCOUNTER — Encounter: Payer: Self-pay | Admitting: Internal Medicine

## 2015-04-23 ENCOUNTER — Non-Acute Institutional Stay (SKILLED_NURSING_FACILITY): Payer: Medicare Other | Admitting: Internal Medicine

## 2015-04-23 DIAGNOSIS — I639 Cerebral infarction, unspecified: Secondary | ICD-10-CM

## 2015-04-23 DIAGNOSIS — C61 Malignant neoplasm of prostate: Secondary | ICD-10-CM

## 2015-04-23 DIAGNOSIS — E785 Hyperlipidemia, unspecified: Secondary | ICD-10-CM

## 2015-04-23 NOTE — Assessment & Plan Note (Signed)
Chronic and stable;Plan - cont plavix; pt on statin; DM not well controlled , non compliance with diet

## 2015-04-23 NOTE — Progress Notes (Signed)
MRN: [id number] Name: Jeremiah Baldwin  Sex: male Age: 69 y.o. DOB: 06/17/1946  [place] #: [facility] Facility/Room:215 Level Of Care: SNF Provider: Inocencio Homes D Emergency Contacts: Extended Emergency Contact Information Primary Emergency Contact: Shutes,CAROLYN Address: [place],  [phone] Home Phone: [phone] Relation: None  Code Status:   Allergies: Codeine  Chief Complaint  Patient presents with  . Medical Management of Chronic Issues    HPI: Patient is 69 y.o. male who is being seen for prostate CA update, CVA and HLD.  Past Medical History  Diagnosis Date  . Allergy   . Anorexia   . Hypertension   . Hyperlipidemia   . Diabetes mellitus without complication   . CAD (coronary artery disease)   . Colon polyp   . Stroke   . Left hemiparesis   . GERD (gastroesophageal reflux disease)   . PVD (peripheral vascular disease)   . Diabetic retinopathy     Past Surgical History  Procedure Laterality Date  . Left bka    . Enteroscopic laser photocoagulation Left 2001      Medication List       This list is accurate as of: 04/23/15  1:59 PM.  Always use your most recent med list.               acetaminophen 325 MG tablet  Commonly known as:  TYLENOL  Take 650 mg by mouth every 6 (six) hours as needed for mild pain or moderate pain.     atorvastatin 10 MG tablet  Commonly known as:  LIPITOR  Take 10 mg by mouth at bedtime.     buPROPion 150 MG 12 hr tablet  Commonly known as:  WELLBUTRIN SR  Take 150 mg by mouth daily. For depression     clopidogrel 75 MG tablet  Commonly known as:  PLAVIX  Take 75 mg by mouth daily.     docusate sodium 100 MG capsule  Commonly known as:  COLACE  Take 300 mg by mouth 3 (three) times daily.     enalapril 5 MG tablet  Commonly known as:  VASOTEC  Take 5 mg by mouth daily.     fluticasone 50 MCG/ACT nasal spray  Commonly known as:  FLONASE  Place 1 spray into both nostrils 2 (two)  times daily as needed for allergies or rhinitis.     insulin detemir 100 UNIT/ML injection  Commonly known as:  LEVEMIR  Inject 15 Units into the skin at bedtime. For diabetes     meclizine 25 MG tablet  Commonly known as:  ANTIVERT  Take 25 mg by mouth 3 (three) times daily. For dizziness     olopatadine 0.1 % ophthalmic solution  Commonly known as:  PATANOL  Place 1 drop into both eyes daily.     pantoprazole 40 MG tablet  Commonly known as:  PROTONIX  Take 40 mg by mouth daily. For reflux     sertraline 100 MG tablet  Commonly known as:  ZOLOFT  Take 100 mg by mouth daily. Give 1.5 tablets by mouth daily for depression.     tamsulosin 0.4 MG Caps capsule  Commonly known as:  FLOMAX  Take 0.4 mg by mouth at bedtime.     triamcinolone ointment 0.1 %  Commonly known as:  KENALOG  Apply 1 application topically 2 (two) times daily as needed. R leg        No  orders of the defined types were placed in this encounter.    Immunization History  Administered Date(s) Administered  . PPD Test 05/12/2009    Social History  Substance Use Topics  . Smoking status: Never Smoker   . Smokeless tobacco: Not on file  . Alcohol Use: Not on file    Review of Systems  DATA OBTAINED: from patient, nurse GENERAL:  no fevers, fatigue, appetite changes SKIN: No itching, rash HEENT: No complaint RESPIRATORY: No cough, wheezing, SOB CARDIAC: No chest pain, palpitations, lower extremity edema  GI: No abdominal pain, No N/V/D or constipation, No heartburn or reflux  GU: No dysuria, frequency or urgency, or incontinence  MUSCULOSKELETAL: No unrelieved bone/joint pain NEUROLOGIC: No headache, dizziness  PSYCHIATRIC: No overt anxiety or sadness  Filed Vitals:   04/23/15 1326  BP: 108/70  Pulse: 73  Temp: 97 F (36.1 C)  Resp: 19    Physical Exam  GENERAL APPEARANCE: Alert, conversant, No acute distress  SKIN: No diaphoresis rash, or wounds HEENT: Unremarkable RESPIRATORY:  Breathing is even, unlabored. Lung sounds are clear   CARDIOVASCULAR: Heart RRR no murmurs, rubs or gallops. No peripheral edema  GASTROINTESTINAL: Abdomen is soft, non-tender, not distended w/ normal bowel sounds.  GENITOURINARY: Bladder non tender, not distended  MUSCULOSKELETAL: L AKA NEUROLOGIC: Cranial nerves 2-12 grossly intact. Moves all extremities PSYCHIATRIC: Mood and affect appropriate to situation, no behavioral issues  Patient Active Problem List   Diagnosis Date Noted  . Prostate cancer 10/30/2014  . Fever presenting with conditions classified elsewhere 10/04/2014  . Acute urinary retention 09/16/2014  . Taking medication for chronic disease 09/07/2014  . Leukocytosis, unspecified 03/03/2014  . Peripheral vascular disease 02/25/2014  . Conjunctivitis 02/25/2014  . Itching 10/23/2013  . Depression 04/17/2013  . Pruritic disorder 03/11/2013  . Urinary tract infection, site not specified 02/27/2013  . Pure hypercholesterolemia 11/29/2012  . Type I (juvenile type) diabetes mellitus with peripheral circulatory disorders, not stated as uncontrolled(250.71) 11/29/2012  . 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 Hyperlipidemia LDL goal <70 11/11/2012  . Loss of weight 11/11/2012  . Essential hypertension, benign 11/11/2012  . Unspecified constipation 11/11/2012  . Type 2 diabetes with complication 03/88/8280  . Benign paroxysmal positional vertigo 11/11/2012  . CVA (cerebral vascular accident) 11/11/2012  . Insomnia 11/11/2012  . 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

## 2015-04-23 NOTE — Assessment & Plan Note (Signed)
LDL 57, HDL 29 in 03/2015;plan - cont lipitor 10 mg

## 2015-04-23 NOTE — Assessment & Plan Note (Signed)
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

## 2015-05-18 ENCOUNTER — Other Ambulatory Visit: Payer: Self-pay | Admitting: Urology

## 2015-05-18 DIAGNOSIS — C61 Malignant neoplasm of prostate: Secondary | ICD-10-CM

## 2015-05-26 ENCOUNTER — Non-Acute Institutional Stay (SKILLED_NURSING_FACILITY): Payer: Medicare Other | Admitting: Internal Medicine

## 2015-05-26 ENCOUNTER — Encounter: Payer: Self-pay | Admitting: Internal Medicine

## 2015-05-26 DIAGNOSIS — K219 Gastro-esophageal reflux disease without esophagitis: Secondary | ICD-10-CM

## 2015-05-26 DIAGNOSIS — F32A Depression, unspecified: Secondary | ICD-10-CM

## 2015-05-26 DIAGNOSIS — F329 Major depressive disorder, single episode, unspecified: Secondary | ICD-10-CM | POA: Diagnosis not present

## 2015-05-26 DIAGNOSIS — I69959 Hemiplegia and hemiparesis following unspecified cerebrovascular disease affecting unspecified side: Secondary | ICD-10-CM | POA: Diagnosis not present

## 2015-05-26 NOTE — Progress Notes (Signed)
MRN: [id number] Name: Jeremiah Baldwin  Sex: male Age: 69 y.o. DOB: 1946/05/19  [place] #: [facility] Facility/Room: Level Of Care: SNF Provider: Inocencio Homes D Emergency Contacts: Extended Emergency Contact Information Primary Emergency Contact: Fackler,CAROLYN Address: [place],  [phone] Home Phone: [phone] Relation: None  Code Status:   Allergies: Codeine  Chief Complaint  Patient presents with  . Medical Management of Chronic Issues    HPI: Patient is 69 y.o. male with stroke with L hemiparesis, HTN, HLD, CAD, DM2 GERD and prostate CA  being seen today for routine issues of depression, L hemiparesis and GERD.  Past Medical History  Diagnosis Date  . Allergy   . Anorexia   . Hypertension   . Hyperlipidemia   . Diabetes mellitus without complication ([place])   . CAD (coronary artery disease)   . Colon polyp   . Stroke ([place])   . Left hemiparesis ([place])   . GERD (gastroesophageal reflux disease)   . PVD (peripheral vascular disease) ([place])   . Diabetic retinopathy [facility])     Past Surgical History  Procedure Laterality Date  . Left bka    . Enteroscopic laser photocoagulation Left 2001      Medication List       This list is accurate as of: 05/26/15 11:59 PM.  Always use your most recent med list.               acetaminophen 325 MG tablet  Commonly known as:  TYLENOL  Take 650 mg by mouth every 6 (six) hours as needed for mild pain or moderate pain.     atorvastatin 10 MG tablet  Commonly known as:  LIPITOR  Take 10 mg by mouth at bedtime.     buPROPion 150 MG 12 hr tablet  Commonly known as:  WELLBUTRIN SR  Take 150 mg by mouth daily. For depression     clopidogrel 75 MG tablet  Commonly known as:  PLAVIX  Take 75 mg by mouth daily.     docusate sodium 100 MG capsule  Commonly known as:  COLACE  Take 300 mg by mouth 3 (three) times daily.     enalapril 5 MG tablet  Commonly known as:  VASOTEC  Take 5 mg by mouth  daily.     fluticasone 50 MCG/ACT nasal spray  Commonly known as:  FLONASE  Place 1 spray into both nostrils 2 (two) times daily as needed for allergies or rhinitis.     insulin detemir 100 UNIT/ML injection  Commonly known as:  LEVEMIR  Inject 15 Units into the skin at bedtime. For diabetes     meclizine 25 MG tablet  Commonly known as:  ANTIVERT  Take 25 mg by mouth 3 (three) times daily. For dizziness     olopatadine 0.1 % ophthalmic solution  Commonly known as:  PATANOL  Place 1 drop into both eyes daily.     pantoprazole 40 MG tablet  Commonly known as:  PROTONIX  Take 40 mg by mouth daily. For reflux     sertraline 100 MG tablet  Commonly known as:  ZOLOFT  Take 100 mg by mouth daily. Give 1.5 tablets by mouth daily for depression.     tamsulosin 0.4 MG Caps capsule  Commonly known as:  FLOMAX  Take 0.4 mg by mouth at bedtime.     triamcinolone ointment 0.1 %  Commonly known as:  KENALOG  Apply  1 application topically 2 (two) times daily as needed. R leg        No orders of the defined types were placed in this encounter.    Immunization History  Administered Date(s) Administered  . PPD Test 05/12/2009    Social History  Substance Use Topics  . Smoking status: Never Smoker   . Smokeless tobacco: Not on file  . Alcohol Use: Not on file    Review of Systems  DATA OBTAINED: from patient, nurse GENERAL:  no fevers, fatigue, appetite changes SKIN: No itching, rash HEENT: No complaint RESPIRATORY: No cough, wheezing, SOB CARDIAC: No chest pain, palpitations, lower extremity edema  GI: No abdominal pain, No N/V/D or constipation, No heartburn or reflux  GU: No dysuria, frequency or urgency, or incontinence  MUSCULOSKELETAL: No unrelieved bone/joint pain NEUROLOGIC: No headache, dizziness  PSYCHIATRIC: No overt anxiety or sadness  Filed Vitals:   05/26/15 1847  BP: 110/66  Pulse: 73  Temp: 98.1 F (36.7 C)  Resp: 20    Physical Exam  GENERAL  APPEARANCE: Alert, conversant, No acute distress  SKIN: No diaphoresis rash HEENT: Unremarkable RESPIRATORY: Breathing is even, unlabored. Lung sounds are clear   CARDIOVASCULAR: Heart RRR no murmurs, rubs or gallops. No peripheral edema  GASTROINTESTINAL: Abdomen is soft, non-tender, not distended w/ normal bowel sounds.  GENITOURINARY: Bladder non tender, not distended  MUSCULOSKELETAL: wasting on L NEUROLOGIC: Cranial nerves 2-12 grossly intact; L hemipatesis PSYCHIATRIC: Mood and affect appropriate to situation, no behavioral issues  Patient Active Problem List   Diagnosis Date Noted  . Prostate cancer ([place]) 10/30/2014  . Fever presenting with conditions classified elsewhere 10/04/2014  . Acute urinary retention 09/16/2014  . Taking medication for chronic disease 09/07/2014  . Leukocytosis, unspecified 03/03/2014  . Peripheral vascular disease ([place]) 02/25/2014  . Conjunctivitis 02/25/2014  . Itching 10/23/2013  . Depression 04/17/2013  . Pruritic disorder 03/11/2013  . Urinary tract infection, site not specified 02/27/2013  . Pure hypercholesterolemia 11/29/2012  . Type I (juvenile type) diabetes mellitus with peripheral circulatory disorders, not stated as uncontrolled(250.71) 11/29/2012  . 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 Hyperlipidemia LDL goal <70 11/11/2012  . Loss of weight 11/11/2012  . Essential hypertension, benign 11/11/2012  . Unspecified constipation 11/11/2012  . Type 2 diabetes with complication ([place]) 61/60/7371  . Benign paroxysmal positional vertigo 11/11/2012  . CVA (cerebral vascular accident) ([place]) 11/11/2012  . Insomnia 11/11/2012  . 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

## 2015-05-30 ENCOUNTER — Encounter: Payer: Self-pay | Admitting: Internal Medicine

## 2015-05-30 NOTE — Assessment & Plan Note (Signed)
Chronic and stable ; Plan - cont wellbutrin 150 gm daily and zoloft 150 mg daily

## 2015-05-30 NOTE — Assessment & Plan Note (Signed)
No change, no new problems;plavix as prophylaxis, pt on statin, DM stable

## 2015-05-30 NOTE — Assessment & Plan Note (Signed)
No reflux of aspiration; Plan - cont protonix 4 mg daily

## 2015-06-01 ENCOUNTER — Encounter (HOSPITAL_COMMUNITY)
Admission: RE | Admit: 2015-06-01 | Discharge: 2015-06-01 | Disposition: A | Discharge status: Home / Self Care | Payer: Medicare Other | Source: Ambulatory Visit | Attending: Urology | Admitting: Urology

## 2015-06-01 DIAGNOSIS — C61 Malignant neoplasm of prostate: Secondary | ICD-10-CM | POA: Insufficient documentation

## 2015-06-01 MED ORDER — TECHNETIUM TC 99M MEDRONATE IV KIT
25.0000 | PACK | Freq: Once | INTRAVENOUS | Status: AC | PRN
  Administered 2015-06-01: 25 via INTRAVENOUS

## 2015-07-02 ENCOUNTER — Non-Acute Institutional Stay (SKILLED_NURSING_FACILITY): Payer: Medicare Other | Admitting: Internal Medicine

## 2015-07-02 ENCOUNTER — Encounter: Payer: Self-pay | Admitting: Internal Medicine

## 2015-07-02 DIAGNOSIS — F411 Generalized anxiety disorder: Secondary | ICD-10-CM

## 2015-07-02 DIAGNOSIS — I739 Peripheral vascular disease, unspecified: Secondary | ICD-10-CM

## 2015-07-02 DIAGNOSIS — I10 Essential (primary) hypertension: Secondary | ICD-10-CM | POA: Diagnosis not present

## 2015-07-02 NOTE — Assessment & Plan Note (Signed)
Chronic and continues controlled on vasotec 5 mg daily;plan - cont vasotec

## 2015-07-02 NOTE — Assessment & Plan Note (Signed)
Pt has had an arterial wound on dorsum of R 2nd toe which has been treatewd and now has now healed

## 2015-07-02 NOTE — Progress Notes (Signed)
MRN: [id number] Name: Jeremiah Baldwin  Sex: male Age: 69 y.o. DOB: 02-13-1946  [place] #:  Facility/Room: Level Of Care: SNF Provider: Inocencio Homes D Emergency Contacts: Extended Emergency Contact Information Primary Emergency Contact: Coach,CAROLYN Address: [place],  [phone] Home Phone: [phone] Relation: None  Code Status:   Allergies: Codeine  Chief Complaint  Patient presents with  . Medical Management of Chronic Issues    HPI: Patient is 69 y.o. male with stroke with L hemiparesis, HTN, HLD, CAD, DM2 GERD and prostate CA being seen today for routine issues of  PVD, HTN and anxiety.  Past Medical History  Diagnosis Date  . Allergy   . Anorexia   . Hypertension   . Hyperlipidemia   . Diabetes mellitus without complication ([place])   . CAD (coronary artery disease)   . Colon polyp   . Stroke ([place])   . Left hemiparesis ([place])   . GERD (gastroesophageal reflux disease)   . PVD (peripheral vascular disease) ([place])   . Diabetic retinopathy [facility])     Past Surgical History  Procedure Laterality Date  . Left bka    . Enteroscopic laser photocoagulation Left 2001      Medication List       This list is accurate as of: 07/02/15 11:59 PM.  Always use your most recent med list.               acetaminophen 325 MG tablet  Commonly known as:  TYLENOL  Take 650 mg by mouth every 6 (six) hours as needed for mild pain or moderate pain.     atorvastatin 10 MG tablet  Commonly known as:  LIPITOR  Take 10 mg by mouth at bedtime.     buPROPion 150 MG 12 hr tablet  Commonly known as:  WELLBUTRIN SR  Take 150 mg by mouth daily. For depression     clopidogrel 75 MG tablet  Commonly known as:  PLAVIX  Take 75 mg by mouth daily.     docusate sodium 100 MG capsule  Commonly known as:  COLACE  Take 300 mg by mouth 3 (three) times daily.     enalapril 5 MG tablet  Commonly known as:  VASOTEC  Take 5 mg by mouth daily.     fluticasone  50 MCG/ACT nasal spray  Commonly known as:  FLONASE  Place 1 spray into both nostrils 2 (two) times daily as needed for allergies or rhinitis.     insulin detemir 100 UNIT/ML injection  Commonly known as:  LEVEMIR  Inject 15 Units into the skin at bedtime. For diabetes     meclizine 25 MG tablet  Commonly known as:  ANTIVERT  Take 25 mg by mouth 3 (three) times daily. For dizziness     olopatadine 0.1 % ophthalmic solution  Commonly known as:  PATANOL  Place 1 drop into both eyes daily.     pantoprazole 40 MG tablet  Commonly known as:  PROTONIX  Take 40 mg by mouth daily. For reflux     sertraline 100 MG tablet  Commonly known as:  ZOLOFT  Take 100 mg by mouth daily. Give 1.5 tablets by mouth daily for depression.     tamsulosin 0.4 MG Caps capsule  Commonly known as:  FLOMAX  Take 0.4 mg by mouth at bedtime.     triamcinolone ointment 0.1 %  Commonly known as:  KENALOG  Apply 1 application  topically 2 (two) times daily as needed. R leg        No orders of the defined types were placed in this encounter.    Immunization History  Administered Date(s) Administered  . PPD Test 05/12/2009    Social History  Substance Use Topics  . Smoking status: Never Smoker   . Smokeless tobacco: Not on file  . Alcohol Use: Not on file    Review of Systems  DATA OBTAINED: from patient, nurse GENERAL:  no fevers, fatigue, appetite changes SKIN: No itching, rash HEENT: No complaint RESPIRATORY: No cough, wheezing, SOB CARDIAC: No chest pain, palpitations, lower extremity edema  GI: No abdominal pain, No N/V/D or constipation, No heartburn or reflux  GU: No dysuria, frequency or urgency, or incontinence  MUSCULOSKELETAL: No unrelieved bone/joint pain NEUROLOGIC: No headache, dizziness  PSYCHIATRIC: No overt anxiety or sadness  Filed Vitals:   07/02/15 1426  BP: 110/66  Pulse: 74  Temp: 97 F (36.1 C)  Resp: 18    Physical Exam  GENERAL APPEARANCE: Alert,  conversant, No acute distress  SKIN: No diaphoresis rash HEENT: Unremarkable RESPIRATORY: Breathing is even, unlabored. Lung sounds are clear   CARDIOVASCULAR: Heart RRR no murmurs, rubs or gallops. No peripheral edema  GASTROINTESTINAL: Abdomen is soft, non-tender, not distended w/ normal bowel sounds.  GENITOURINARY: Bladder non tender, not distended  MUSCULOSKELETAL: wasting on L NEUROLOGIC: Cranial nerves 2-12 grossly intact; L hemiparesis PSYCHIATRIC: Mood and affect appropriate to situation, no behavioral issues  Patient Active Problem List   Diagnosis Date Noted  . Anxiety state 07/02/2015  . Prostate cancer ([place]) 10/30/2014  . Fever presenting with conditions classified elsewhere 10/04/2014  . Acute urinary retention 09/16/2014  . Taking medication for chronic disease 09/07/2014  . Leukocytosis, unspecified 03/03/2014  . Peripheral vascular disease ([place]) 02/25/2014  . Conjunctivitis 02/25/2014  . Itching 10/23/2013  . Depression 04/17/2013  . Pruritic disorder 03/11/2013  . Urinary tract infection, site not specified 02/27/2013  . Pure hypercholesterolemia 11/29/2012  . Type I (juvenile type) diabetes mellitus with peripheral circulatory disorders, not stated as uncontrolled(250.71) 11/29/2012  . 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 Hyperlipidemia LDL goal <70 11/11/2012  . Loss of weight 11/11/2012  . Essential hypertension, benign 11/11/2012  . Unspecified constipation 11/11/2012  . Type 2 diabetes with complication ([place]) 99991111  . Benign paroxysmal positional vertigo 11/11/2012  . CVA (cerebral vascular accident) ([place]) 11/11/2012  . Insomnia 11/11/2012  . GERD (gastroesophageal reflux disease) 11/11/2012    CBC    Component Value Date/Time   WBC 11.6 10/14/2014   HGB 11.4* 10/14/2014   HCT 37* 10/14/2014   PLT 187 10/14/2014    CMP     Component Value Date/Time   NA 135* 10/02/2014   K 3.9 10/02/2014    BUN 23* 10/02/2014   CREATININE 0.6 02/21/2014   AST 21 10/02/2014   ALT 16 10/02/2014    Assessment and Plan  Peripheral vascular disease Pt has had an arterial wound on dorsum of R 2nd toe which has been treatewd and now has now healed  Essential hypertension, benign Chronic and continues controlled on vasotec 5 mg daily;plan - cont vasotec  Anxiety state Chronic and stable on zoloft 150 mg qHs;plan - cont to avoid decompensation    Hennie Duos, MD

## 2015-07-02 NOTE — Assessment & Plan Note (Signed)
Chronic and stable on zoloft 150 mg qHs;plan - cont to avoid decompensation

## 2015-07-28 ENCOUNTER — Encounter: Payer: Self-pay | Admitting: Internal Medicine

## 2015-07-28 ENCOUNTER — Non-Acute Institutional Stay (SKILLED_NURSING_FACILITY): Payer: Medicare Other | Admitting: Internal Medicine

## 2015-07-28 DIAGNOSIS — Z794 Long term (current) use of insulin: Secondary | ICD-10-CM | POA: Diagnosis not present

## 2015-07-28 DIAGNOSIS — H8113 Benign paroxysmal vertigo, bilateral: Secondary | ICD-10-CM

## 2015-07-28 DIAGNOSIS — E118 Type 2 diabetes mellitus with unspecified complications: Secondary | ICD-10-CM

## 2015-07-28 DIAGNOSIS — E785 Hyperlipidemia, unspecified: Secondary | ICD-10-CM | POA: Diagnosis not present

## 2015-07-28 NOTE — Assessment & Plan Note (Signed)
No reported problems; plan - cont scheduled antivert 325 mg TID

## 2015-07-28 NOTE — Progress Notes (Signed)
MRN: [id number] Name: Jeremiah Baldwin  Sex: male Age: 70 y.o. DOB: 04-Oct-1945  [place] [facility]: [facility] Facility/Room: Level Of Care: SNF Provider: Inocencio Homes D Emergency Contacts: Extended Emergency Contact Information Primary Emergency Contact: Kurt,CAROLYN Address: [place],  [id number] Home Phone: [phone] Relation: None  Code Status:   Allergies: Codeine  Chief Complaint  Patient presents with  . Medical Management of Chronic Issues    HPI: Patient is 70 y.o. male with stroke with L hemiparesis, HTN, HLD, CAD, DM2 GERD and prostate CA being seen today for routine issues of DM2, HLD and BPV.   Past Medical History  Diagnosis Date  . Allergy   . Anorexia   . Hypertension   . Hyperlipidemia   . Diabetes mellitus without complication ([place])   . CAD (coronary artery disease)   . Colon polyp   . Stroke ([place])   . Left hemiparesis ([place])   . GERD (gastroesophageal reflux disease)   . PVD (peripheral vascular disease) ([place])   . Diabetic retinopathy [facility])     Past Surgical History  Procedure Laterality Date  . Left bka    . Enteroscopic laser photocoagulation Left 2001      Medication List       This list is accurate as of: 07/28/15 11:59 PM.  Always use your most recent med list.               acetaminophen 325 MG tablet  Commonly known as:  TYLENOL  Take 650 mg by mouth every 6 (six) hours as needed for mild pain or moderate pain.     atorvastatin 10 MG tablet  Commonly known as:  LIPITOR  Take 10 mg by mouth at bedtime.     buPROPion 150 MG 12 hr tablet  Commonly known as:  WELLBUTRIN SR  Take 150 mg by mouth daily. For depression     clopidogrel 75 MG tablet  Commonly known as:  PLAVIX  Take 75 mg by mouth daily.     docusate sodium 100 MG capsule  Commonly known as:  COLACE  Take 300 mg by mouth 3 (three) times daily.     enalapril 5 MG tablet  Commonly known as:  VASOTEC  Take 5 mg by mouth daily.     fluticasone 50 MCG/ACT nasal spray  Commonly known as:  FLONASE  Place 1 spray into both nostrils 2 (two) times daily as needed for allergies or rhinitis.     meclizine 25 MG tablet  Commonly known as:  ANTIVERT  Take 25 mg by mouth 3 (three) times daily. For dizziness     metFORMIN 1000 MG tablet  Commonly known as:  GLUCOPHAGE  Take 1,000 mg by mouth 2 (two) times daily with a meal.     olopatadine 0.1 % ophthalmic solution  Commonly known as:  PATANOL  Place 1 drop into both eyes daily.     pantoprazole 40 MG tablet  Commonly known as:  PROTONIX  Take 40 mg by mouth daily. For reflux     sertraline 100 MG tablet  Commonly known as:  ZOLOFT  Take 100 mg by mouth daily. Give 1.5 tablets by mouth daily for depression.     tamsulosin 0.4 MG Caps capsule  Commonly known as:  FLOMAX  Take 0.4 mg by mouth at bedtime.     TOUJEO SOLOSTAR 300 UNIT/ML Sopn  Generic drug:  Insulin Glargine  Inject  15 Units into the skin at bedtime.     triamcinolone ointment 0.1 %  Commonly known as:  KENALOG  Apply 1 application topically 2 (two) times daily as needed. R leg        Meds ordered this encounter  Medications  . Insulin Glargine (TOUJEO SOLOSTAR) 300 UNIT/ML SOPN    Sig: Inject 15 Units into the skin at bedtime.  . metFORMIN (GLUCOPHAGE) 1000 MG tablet    Sig: Take 1,000 mg by mouth 2 (two) times daily with a meal.    Immunization History  Administered Date(s) Administered  . PPD Test 05/12/2009    Social History  Substance Use Topics  . Smoking status: Never Smoker   . Smokeless tobacco: Not on file  . Alcohol Use: Not on file    Review of Systems  DATA OBTAINED: from patient, nurse GENERAL:  no fevers, fatigue, appetite changes SKIN: No itching, rash HEENT: No complaint RESPIRATORY: No cough, wheezing, SOB CARDIAC: No chest pain, palpitations, lower extremity edema  GI: No abdominal pain, No N/V/D or constipation, No heartburn or reflux  GU: No dysuria,  frequency or urgency, or incontinence  MUSCULOSKELETAL: No unrelieved bone/joint pain NEUROLOGIC: No headache, dizziness  PSYCHIATRIC: No overt anxiety or sadness  Filed Vitals:   07/28/15 1252  BP: 110/66  Pulse: 76  Temp: 98.2 F (36.8 C)  Resp: 19    Physical Exam  GENERAL APPEARANCE: Alert, conversant, No acute distress  SKIN: No diaphoresis rash HEENT: Unremarkable RESPIRATORY: Breathing is even, unlabored. Lung sounds are clear   CARDIOVASCULAR: Heart RRR no murmurs, rubs or gallops. No peripheral edema  GASTROINTESTINAL: Abdomen is soft, non-tender, not distended w/ normal bowel sounds.  GENITOURINARY: Bladder non tender, not distended  MUSCULOSKELETAL: wasting on L  NEUROLOGIC: Cranial nerves 2-12 grossly intact; L hemiparesis PSYCHIATRIC: Mood and affect appropriate to situation, no behavioral issues  Patient Active Problem List   Diagnosis Date Noted  . Anxiety state 07/02/2015  . Prostate cancer ([place]) 10/30/2014  . Fever presenting with conditions classified elsewhere 10/04/2014  . Acute urinary retention 09/16/2014  . Taking medication for chronic disease 09/07/2014  . Leukocytosis, unspecified 03/03/2014  . Peripheral vascular disease ([place]) 02/25/2014  . Conjunctivitis 02/25/2014  . Itching 10/23/2013  . Depression 04/17/2013  . Pruritic disorder 03/11/2013  . Urinary tract infection, site not specified 02/27/2013  . Pure hypercholesterolemia 11/29/2012  . Type I (juvenile type) diabetes mellitus with peripheral circulatory disorders, not stated as uncontrolled(250.71) 11/29/2012  . 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 Hyperlipidemia LDL goal <70 11/11/2012  . Loss of weight 11/11/2012  . Essential hypertension, benign 11/11/2012  . Unspecified constipation 11/11/2012  . Type 2 diabetes with complication ([place]) 99991111  . Benign paroxysmal positional vertigo 11/11/2012  . CVA (cerebral vascular  accident) ([place]) 11/11/2012  . Insomnia 11/11/2012  . GERD (gastroesophageal reflux disease) 11/11/2012    CBC    Component Value Date/Time   WBC 11.6 10/14/2014   HGB 11.4* 10/14/2014   HCT 37* 10/14/2014   PLT 187 10/14/2014    CMP     Component Value Date/Time   NA 135* 10/02/2014   K 3.9 10/02/2014   BUN 23* 10/02/2014   CREATININE 0.6 02/21/2014   AST 21 10/02/2014   ALT 16 10/02/2014    Assessment and Plan  Type 2 diabetes with complication In [id number] was 5.6- excellent!!;plan - cont current regimen of toujeo 15 u q Hs and glucophage 1000  mg BID; pt on ACE byt statin d/c this visit for very low LDL  Hyperlipidemia LDL goal <70 06/2015  LDL 42, HDL 30 on lipitor 10 mg ; will d/c lipitor and rechecl FLP 3 month  Benign paroxysmal positional vertigo No reported problems; plan - cont scheduled antivert 325 mg TID    Hennie Duos, MD

## 2015-07-28 NOTE — Assessment & Plan Note (Addendum)
In 06/2015 A1c was 5.6- excellent!!;plan - cont current regimen of toujeo 15 u q Hs and glucophage 1000 mg BID; pt on ACE byt statin d/c this visit for very low LDL

## 2015-07-28 NOTE — Assessment & Plan Note (Signed)
06/2015  LDL 42, HDL 30 on lipitor 10 mg ; will d/c lipitor and rechecl FLP 3 month

## 2015-09-08 ENCOUNTER — Non-Acute Institutional Stay (SKILLED_NURSING_FACILITY): Payer: Medicare Other | Admitting: Internal Medicine

## 2015-09-08 DIAGNOSIS — E785 Hyperlipidemia, unspecified: Secondary | ICD-10-CM | POA: Diagnosis not present

## 2015-09-08 DIAGNOSIS — I6330 Cerebral infarction due to thrombosis of unspecified cerebral artery: Secondary | ICD-10-CM

## 2015-09-08 DIAGNOSIS — K219 Gastro-esophageal reflux disease without esophagitis: Secondary | ICD-10-CM | POA: Diagnosis not present

## 2015-09-08 NOTE — Progress Notes (Signed)
MRN: [id number] Name: Jeremiah Baldwin  Sex: male Age: 70 y.o. DOB: May 04, 1946  [place] [facility] Facility/Room: Level Of Care: SNF Provider: Inocencio Homes D Emergency Contacts: Extended Emergency Contact Information Primary Emergency Contact: Piscopo,CAROLYN Address: [place],  [phone] Home Phone: [phone] Relation: None  Code Status:   Allergies: Codeine  Chief Complaint  Patient presents with  . Medical Management of Chronic Issues    HPI: Patient is 70 y.o. male with stroke with L hemiparesis, HTN, HLD, CAD, DM2 GERD and prostate CA being seen today for routine issues of CVA, GERD and HLD.  Past Medical History  Diagnosis Date  . Allergy   . Anorexia   . Hypertension   . Hyperlipidemia   . Diabetes mellitus without complication ([place])   . CAD (coronary artery disease)   . Colon polyp   . Stroke ([place])   . Left hemiparesis ([place])   . GERD (gastroesophageal reflux disease)   . PVD (peripheral vascular disease) ([place])   . Diabetic retinopathy [facility])     Past Surgical History  Procedure Laterality Date  . Left bka    . Enteroscopic laser photocoagulation Left 2001      Medication List       This list is accurate as of: 09/08/15 11:59 PM.  Always use your most recent med list.               acetaminophen 325 MG tablet  Commonly known as:  TYLENOL  Take 650 mg by mouth every 6 (six) hours as needed for mild pain or moderate pain.     atorvastatin 10 MG tablet  Commonly known as:  LIPITOR  Take 10 mg by mouth at bedtime.     buPROPion 150 MG 12 hr tablet  Commonly known as:  WELLBUTRIN SR  Take 150 mg by mouth daily. For depression     clopidogrel 75 MG tablet  Commonly known as:  PLAVIX  Take 75 mg by mouth daily.     docusate sodium 100 MG capsule  Commonly known as:  COLACE  Take 300 mg by mouth 3 (three) times daily.     enalapril 5 MG tablet  Commonly known as:  VASOTEC  Take 5 mg by mouth daily.     fluticasone 50 MCG/ACT nasal spray  Commonly known as:  FLONASE  Place 1 spray into both nostrils 2 (two) times daily as needed for allergies or rhinitis.     meclizine 25 MG tablet  Commonly known as:  ANTIVERT  Take 25 mg by mouth 3 (three) times daily. For dizziness     metFORMIN 1000 MG tablet  Commonly known as:  GLUCOPHAGE  Take 1,000 mg by mouth 2 (two) times daily with a meal.     olopatadine 0.1 % ophthalmic solution  Commonly known as:  PATANOL  Place 1 drop into both eyes daily.     pantoprazole 40 MG tablet  Commonly known as:  PROTONIX  Take 40 mg by mouth daily. For reflux     sertraline 100 MG tablet  Commonly known as:  ZOLOFT  Take 100 mg by mouth daily. Give 1.5 tablets by mouth daily for depression.     tamsulosin 0.4 MG Caps capsule  Commonly known as:  FLOMAX  Take 0.4 mg by mouth at bedtime.     TOUJEO SOLOSTAR 300 UNIT/ML Sopn  Generic drug:  Insulin Glargine  Inject 15  Units into the skin at bedtime.     triamcinolone ointment 0.1 %  Commonly known as:  KENALOG  Apply 1 application topically 2 (two) times daily as needed. R leg        No orders of the defined types were placed in this encounter.    Immunization History  Administered Date(s) Administered  . PPD Test 05/12/2009    Social History  Substance Use Topics  . Smoking status: Never Smoker   . Smokeless tobacco: Not on file  . Alcohol Use: Not on file    Review of Systems  DATA OBTAINED: from patient, nurse GENERAL:  no fevers, fatigue, appetite changes SKIN: No itching, rash HEENT: No complaint RESPIRATORY: No cough, wheezing, SOB CARDIAC: No chest pain, palpitations, lower extremity edema  GI: No abdominal pain, No N/V/D or constipation, No heartburn or reflux  GU: No dysuria, frequency or urgency, or incontinence  MUSCULOSKELETAL: No unrelieved bone/joint pain NEUROLOGIC: No headache, dizziness  PSYCHIATRIC: No overt anxiety or sadness  Filed Vitals:   09/12/15  1008  BP: 110/66  Pulse: 77  Temp: 97.3 F (36.3 C)  Resp: 22    Physical Exam  GENERAL APPEARANCE: Alert, conversant, No acute distress  SKIN: No diaphoresis rash, or wounds HEENT: Unremarkable RESPIRATORY: Breathing is even, unlabored. Lung sounds are clear   CARDIOVASCULAR: Heart RRR no murmurs, rubs or gallops. No peripheral edema  GASTROINTESTINAL: Abdomen is soft, non-tender, not distended w/ normal bowel sounds.  GENITOURINARY: Bladder non tender, not distended  MUSCULOSKELETAL: L side wasting  NEUROLOGIC: Cranial nerves 2-12 grossly intact; L hemiparesis PSYCHIATRIC: Mood and affect appropriate to situation, no behavioral issues  Patient Active Problem List   Diagnosis Date Noted  . Anxiety state 07/02/2015  . Prostate cancer ([place]) 10/30/2014  . Fever presenting with conditions classified elsewhere 10/04/2014  . Acute urinary retention 09/16/2014  . Taking medication for chronic disease 09/07/2014  . Leukocytosis, unspecified 03/03/2014  . Peripheral vascular disease ([place]) 02/25/2014  . Conjunctivitis 02/25/2014  . Itching 10/23/2013  . Depression 04/17/2013  . Pruritic disorder 03/11/2013  . Urinary tract infection, site not specified 02/27/2013  . Pure hypercholesterolemia 11/29/2012  . Type I (juvenile type) diabetes mellitus with peripheral circulatory disorders, not stated as uncontrolled(250.71) 11/29/2012  . 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 Hyperlipidemia LDL goal <70 11/11/2012  . Loss of weight 11/11/2012  . Essential hypertension, benign 11/11/2012  . Unspecified constipation 11/11/2012  . Type 2 diabetes with complication ([place]) 99991111  . Benign paroxysmal positional vertigo 11/11/2012  . CVA (cerebral vascular accident) ([place]) 11/11/2012  . Insomnia 11/11/2012  . GERD (gastroesophageal reflux disease) 11/11/2012    CBC    Component Value Date/Time   WBC 11.6 10/14/2014   HGB 11.4*  10/14/2014   HCT 37* 10/14/2014   PLT 187 10/14/2014    CMP     Component Value Date/Time   NA 135* 10/02/2014   K 3.9 10/02/2014   BUN 23* 10/02/2014   CREATININE 0.6 02/21/2014   AST 21 10/02/2014   ALT 16 10/02/2014    Assessment and Plan  CVA (cerebral vascular accident) Stable, no new deficitis; plan - cont plavix; pt has ben taken off statin because it appears pt doesn't need it  GERD (gastroesophageal reflux disease) No signs of sx of reflux even though pt eats in bed daily;cont ptotonix 40 mg  Hyperlipidemia LDL goal <70 Lipitot and been d/c ; will be checking FLP in march  Hennie Duos, MD

## 2015-09-12 ENCOUNTER — Encounter: Payer: Self-pay | Admitting: Internal Medicine

## 2015-09-12 NOTE — Assessment & Plan Note (Signed)
Lipitot and been d/c ; will be checking FLP in march

## 2015-09-12 NOTE — Assessment & Plan Note (Addendum)
Stable, no new deficitis; plan - cont plavix; pt has ben taken off statin because it appears pt doesn't need it

## 2015-09-12 NOTE — Assessment & Plan Note (Signed)
No signs of sx of reflux even though pt eats in bed daily;cont ptotonix 40 mg

## 2015-09-29 LAB — LIPID PANEL
CHOLESTEROL: 163 mg/dL (ref 0–200)
HDL: 35 mg/dL (ref 35–70)
LDL Cholesterol: 104 mg/dL
TRIGLYCERIDES: 122 mg/dL (ref 40–160)

## 2015-10-06 ENCOUNTER — Non-Acute Institutional Stay (SKILLED_NURSING_FACILITY): Payer: Medicare Other | Admitting: Internal Medicine

## 2015-10-06 ENCOUNTER — Encounter: Payer: Self-pay | Admitting: Internal Medicine

## 2015-10-06 DIAGNOSIS — R69 Illness, unspecified: Secondary | ICD-10-CM | POA: Diagnosis not present

## 2015-10-06 NOTE — Progress Notes (Signed)
MRN: [id number] Name: Dazhon Newitt  Sex: male Age: 70 y.o. DOB: January 19, 1946  [place] [facility] Facility/Room:215 Level Of Care: SNF Provider: Inocencio Homes D Emergency Contacts: Extended Emergency Contact Information Primary Emergency Contact: Hafner,CAROLYN Address: [place],  [phone] Home Phone: [phone] Relation: None  Code Status:   Allergies: Codeine  Chief Complaint  Patient presents with  . Acute Visit    HPI: Patient is 70 y.o. male who nursing reports ate no lunch or breakfast for the past 2 days. Pt denies he has a cough, burning with urination, abd pain n/v/d/, muscle aches or fever. He denies that he is ready to go home to God.  Past Medical History  Diagnosis Date  . Allergy   . Anorexia   . Hypertension   . Hyperlipidemia   . Diabetes mellitus without complication ([place])   . CAD (coronary artery disease)   . Colon polyp   . Stroke ([place])   . Left hemiparesis ([place])   . GERD (gastroesophageal reflux disease)   . PVD (peripheral vascular disease) ([place])   . Diabetic retinopathy [facility])     Past Surgical History  Procedure Laterality Date  . Left bka    . Enteroscopic laser photocoagulation Left 2001      Medication List       This list is accurate as of: 10/06/15 11:59 PM.  Always use your most recent med list.               acetaminophen 325 MG tablet  Commonly known as:  TYLENOL  Take 650 mg by mouth every 6 (six) hours as needed for mild pain or moderate pain.     atorvastatin 10 MG tablet  Commonly known as:  LIPITOR  Take 10 mg by mouth at bedtime.     buPROPion 150 MG 12 hr tablet  Commonly known as:  WELLBUTRIN SR  Take 150 mg by mouth daily. For depression     clopidogrel 75 MG tablet  Commonly known as:  PLAVIX  Take 75 mg by mouth daily.     docusate sodium 100 MG capsule  Commonly known as:  COLACE  Take 300 mg by mouth 3 (three) times daily.     enalapril 5 MG tablet  Commonly known  as:  VASOTEC  Take 5 mg by mouth daily.     fluticasone 50 MCG/ACT nasal spray  Commonly known as:  FLONASE  Place 1 spray into both nostrils 2 (two) times daily as needed for allergies or rhinitis.     meclizine 25 MG tablet  Commonly known as:  ANTIVERT  Take 25 mg by mouth 3 (three) times daily. For dizziness     metFORMIN 1000 MG tablet  Commonly known as:  GLUCOPHAGE  Take 1,000 mg by mouth 2 (two) times daily with a meal.     olopatadine 0.1 % ophthalmic solution  Commonly known as:  PATANOL  Place 1 drop into both eyes daily.     pantoprazole 40 MG tablet  Commonly known as:  PROTONIX  Take 40 mg by mouth daily. For reflux     sertraline 100 MG tablet  Commonly known as:  ZOLOFT  Take 100 mg by mouth daily. Give 1.5 tablets by mouth daily for depression.     tamsulosin 0.4 MG Caps capsule  Commonly known as:  FLOMAX  Take 0.4 mg by mouth at bedtime.  TOUJEO SOLOSTAR 300 UNIT/ML Sopn  Generic drug:  Insulin Glargine  Inject 15 Units into the skin at bedtime.     triamcinolone ointment 0.1 %  Commonly known as:  KENALOG  Apply 1 application topically 2 (two) times daily as needed. R leg        No orders of the defined types were placed in this encounter.    Immunization History  Administered Date(s) Administered  . PPD Test 05/12/2009    Social History  Substance Use Topics  . Smoking status: Never Smoker   . Smokeless tobacco: Not on file  . Alcohol Use: Not on file    Review of Systems  DATA OBTAINED: from patient, nurse GENERAL:  no fevers, fatigue, appetite changes SKIN: No itching, rash HEENT: No complaint RESPIRATORY: No cough, wheezing, SOB CARDIAC: No chest pain, palpitations, lower extremity edema  GI: No abdominal pain, No N/V/D or constipation, No heartburn or reflux  GU: No dysuria, frequency or urgency, or incontinence  MUSCULOSKELETAL: No unrelieved bone/joint pain NEUROLOGIC: No headache, dizziness  PSYCHIATRIC: No overt  anxiety or sadness  Filed Vitals:   10/06/15 1531  BP: 110/66  Pulse: 70  Temp: 97.3 F (36.3 C)  Resp: 20    Physical Exam  GENERAL APPEARANCE: Alert, conversant, not his usual, looks tired or sick, not toxic SKIN: No diaphoresis rash, or wounds HEENT: Unremarkable RESPIRATORY: Breathing is even, unlabored. Lung sounds are clear   CARDIOVASCULAR: Heart RRR no murmurs, rubs or gallops. No peripheral edema  GASTROINTESTINAL: Abdomen is soft, non-tender, not distended w/ normal bowel sounds.  GENITOURINARY: Bladder non tender, not distended  MUSCULOSKELETAL: wasting l side NEUROLOGIC: Cranial nerves 2-12 grossly intact.l side weakness PSYCHIATRIC: Mood and affect appropriate to situation, no behavioral issues  Patient Active Problem List   Diagnosis Date Noted  . Anxiety state 07/02/2015  . Prostate cancer ([place]) 10/30/2014  . Fever presenting with conditions classified elsewhere 10/04/2014  . Acute urinary retention 09/16/2014  . Taking medication for chronic disease 09/07/2014  . Leukocytosis, unspecified 03/03/2014  . Peripheral vascular disease ([place]) 02/25/2014  . Conjunctivitis 02/25/2014  . Itching 10/23/2013  . Depression 04/17/2013  . Pruritic disorder 03/11/2013  . Urinary tract infection, site not specified 02/27/2013  . Pure hypercholesterolemia 11/29/2012  . Type I (juvenile type) diabetes mellitus with peripheral circulatory disorders, not stated as uncontrolled(250.71) 11/29/2012  . 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 Hyperlipidemia LDL goal <70 11/11/2012  . Loss of weight 11/11/2012  . Essential hypertension, benign 11/11/2012  . Unspecified constipation 11/11/2012  . Type 2 diabetes with complication ([place]) 99991111  . Benign paroxysmal positional vertigo 11/11/2012  . CVA (cerebral vascular accident) ([place]) 11/11/2012  . Insomnia 11/11/2012  . GERD (gastroesophageal reflux disease) 11/11/2012     CBC    Component Value Date/Time   WBC 11.6 10/14/2014   HGB 11.4* 10/14/2014   HCT 37* 10/14/2014   PLT 187 10/14/2014    CMP     Component Value Date/Time   NA 135* 10/02/2014   K 3.9 10/02/2014   BUN 23* 10/02/2014   CREATININE 0.6 02/21/2014   AST 21 10/02/2014   ALT 16 10/02/2014    Assessment and Plan  SICK - no specific sx so will be looking;CXR, U/A with C and S, rapid flu, CBC, BMP; there is a GI bug going around and a URI; will monitor test results and pt; encouraged to push fluids   Time spent > 25  min;> 50% of time with patient was spent reviewing records, labs, tests and studies, counseling and developing plan of care  Hennie Duos, MD

## 2015-10-08 LAB — CBC AND DIFFERENTIAL
HCT: 39 % — AB (ref 41–53)
HEMOGLOBIN: 12.9 g/dL — AB (ref 13.5–17.5)
Platelets: 231 10*3/uL (ref 150–399)
WBC: 12.7 10*3/mL

## 2015-10-08 LAB — BASIC METABOLIC PANEL
BUN: 26 mg/dL — AB (ref 4–21)
CREATININE: 0.7 mg/dL (ref 0.6–1.3)
Glucose: 147 mg/dL
POTASSIUM: 3.9 mmol/L (ref 3.4–5.3)
Sodium: 140 mmol/L (ref 137–147)

## 2015-10-13 LAB — CBC AND DIFFERENTIAL
HCT: 36 % — AB (ref 41–53)
HEMOGLOBIN: 11.9 g/dL — AB (ref 13.5–17.5)
Platelets: 175 10*3/uL (ref 150–399)
WBC: 10.2 10*3/mL

## 2015-10-17 ENCOUNTER — Encounter: Payer: Self-pay | Admitting: Internal Medicine

## 2015-11-23 ENCOUNTER — Non-Acute Institutional Stay (SKILLED_NURSING_FACILITY): Payer: Medicare Other | Admitting: Internal Medicine

## 2015-11-23 ENCOUNTER — Encounter: Payer: Self-pay | Admitting: Internal Medicine

## 2015-11-23 DIAGNOSIS — F329 Major depressive disorder, single episode, unspecified: Secondary | ICD-10-CM

## 2015-11-23 DIAGNOSIS — H109 Unspecified conjunctivitis: Secondary | ICD-10-CM

## 2015-11-23 DIAGNOSIS — C61 Malignant neoplasm of prostate: Secondary | ICD-10-CM | POA: Diagnosis not present

## 2015-11-23 DIAGNOSIS — F32A Depression, unspecified: Secondary | ICD-10-CM

## 2015-11-23 NOTE — Assessment & Plan Note (Signed)
Pt was recently seen by Oncology where family and pt reaffirmed to continue no treatment except for complications or pain due to metastatic dx; will monitor for signs or sx  In the 6 months before pt is seen by Onc again

## 2015-11-23 NOTE — Progress Notes (Signed)
MRN: [id number] Name: Jeremiah Baldwin  Sex: male Age: 70 y.o. DOB: [birth year]  [place] #: [facility] Facility/Room: Level Of Care: SNF Provider: Inocencio Homes D Emergency Contacts: Extended Emergency Contact Information Primary Emergency Contact: Callegari,CAROLYN Address: [place],  [phone] Home Phone: [phone] Relation: None  Code Status:   Allergies: Codeine  Chief Complaint  Patient presents with  . Medical Management of Chronic Issues  . Acute Visit    HPI: Patient is 70 y.o. male who is being seen for routine issues of depression and prostate CA and for an acute issues of B red and draining eyes, onset over last 2 days. First was r eye then left. Nursing admits pt touches his eyes all the time. Pt is already on patanol drops. Pt has had no cold, cough or fever.  Past Medical History  Diagnosis Date  . Allergy   . Anorexia   . Hypertension   . Hyperlipidemia   . Diabetes mellitus without complication ([place])   . CAD (coronary artery disease)   . Colon polyp   . Stroke ([place])   . Left hemiparesis ([place])   . GERD (gastroesophageal reflux disease)   . PVD (peripheral vascular disease) ([place])   . Diabetic retinopathy [facility])     Past Surgical History  Procedure Laterality Date  . Left bka    . Enteroscopic laser photocoagulation Left 2001      Medication List       This list is accurate as of: 11/23/15  7:41 PM.  Always use your most recent med list.               acetaminophen 325 MG tablet  Commonly known as:  TYLENOL  Take 650 mg by mouth every 6 (six) hours as needed for mild pain or moderate pain.     atorvastatin 10 MG tablet  Commonly known as:  LIPITOR  Take 10 mg by mouth at bedtime.     buPROPion 150 MG 12 hr tablet  Commonly known as:  WELLBUTRIN SR  Take 150 mg by mouth daily. For depression     clopidogrel 75 MG tablet  Commonly known as:  PLAVIX  Take 75 mg by mouth daily.     docusate sodium 100 MG capsule   Commonly known as:  COLACE  Take 300 mg by mouth 3 (three) times daily.     enalapril 5 MG tablet  Commonly known as:  VASOTEC  Take 5 mg by mouth daily.     fluticasone 50 MCG/ACT nasal spray  Commonly known as:  FLONASE  Place 1 spray into both nostrils 2 (two) times daily as needed for allergies or rhinitis.     meclizine 25 MG tablet  Commonly known as:  ANTIVERT  Take 25 mg by mouth 3 (three) times daily. For dizziness     metFORMIN 1000 MG tablet  Commonly known as:  GLUCOPHAGE  Take 1,000 mg by mouth 2 (two) times daily with a meal.     olopatadine 0.1 % ophthalmic solution  Commonly known as:  PATANOL  Place 1 drop into both eyes daily.     pantoprazole 40 MG tablet  Commonly known as:  PROTONIX  Take 40 mg by mouth daily. For reflux     sertraline 100 MG tablet  Commonly known as:  ZOLOFT  Take 100 mg by mouth daily. Give 1.5 tablets by mouth daily for depression.  tamsulosin 0.4 MG Caps capsule  Commonly known as:  FLOMAX  Take 0.4 mg by mouth at bedtime.     TOUJEO SOLOSTAR 300 UNIT/ML Sopn  Generic drug:  Insulin Glargine  Inject 15 Units into the skin at bedtime.     triamcinolone ointment 0.1 %  Commonly known as:  KENALOG  Apply 1 application topically 2 (two) times daily as needed. R leg        No orders of the defined types were placed in this encounter.    Immunization History  Administered Date(s) Administered  . PPD Test 05/12/2009    Social History  Substance Use Topics  . Smoking status: Never Smoker   . Smokeless tobacco: Not on file  . Alcohol Use: Not on file    Review of Systems  DATA OBTAINED: from patient, nurse GENERAL:  no fevers, fatigue, appetite changes SKIN: No itching, rash HEENT: No complaint RESPIRATORY: No cough, wheezing, SOB CARDIAC: No chest pain, palpitations, lower extremity edema  GI: No abdominal pain, No N/V/D or constipation, No heartburn or reflux  GU: No dysuria, frequency or urgency, or  incontinence  MUSCULOSKELETAL: No unrelieved bone/joint pain NEUROLOGIC: No headache, dizziness  PSYCHIATRIC: No overt anxiety or sadness  Filed Vitals:   11/23/15 1435  BP: 110/66  Pulse: 74  Temp: 96.9 F (36.1 C)  Resp: 16    Physical Exam  GENERAL APPEARANCE: Alert, conversant, No acute distress  SKIN: No diaphoresis rash HEENT: B conjunctiva injected;both with mod cloudy drainage RESPIRATORY: Breathing is even, unlabored. Lung sounds are clear   CARDIOVASCULAR: Heart RRR no murmurs, rubs or gallops. No peripheral edema  GASTROINTESTINAL: Abdomen is soft, non-tender, not distended w/ normal bowel sounds.  GENITOURINARY: Bladder non tender, not distended  MUSCULOSKELETAL: No abnormal joints or musculature NEUROLOGIC: Cranial nerves 2-12 grossly intact. Moves all extremities PSYCHIATRIC: Mood and affect appropriate to situation with dementia, no behavioral issues  Patient Active Problem List   Diagnosis Date Noted  . Anxiety state 07/02/2015  . Prostate cancer ([place]) 10/30/2014  . Fever presenting with conditions classified elsewhere 10/04/2014  . Acute urinary retention 09/16/2014  . Taking medication for chronic disease 09/07/2014  . Leukocytosis, unspecified 03/03/2014  . Peripheral vascular disease ([place]) 02/25/2014  . Conjunctivitis 02/25/2014  . Itching 10/23/2013  . Depression 04/17/2013  . Pruritic disorder 03/11/2013  . Urinary tract infection, site not specified 02/27/2013  . Pure hypercholesterolemia 11/29/2012  . Type I (juvenile type) diabetes mellitus with peripheral circulatory disorders, not stated as uncontrolled(250.71) 11/29/2012  . 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 Hyperlipidemia LDL goal <70 11/11/2012  . Loss of weight 11/11/2012  . Essential hypertension, benign 11/11/2012  . Unspecified constipation 11/11/2012  . Type 2 diabetes with complication ([place]) 99991111  . Benign paroxysmal  positional vertigo 11/11/2012  . CVA (cerebral vascular accident) ([place]) 11/11/2012  . Insomnia 11/11/2012  . GERD (gastroesophageal reflux disease) 11/11/2012    CBC    Component Value Date/Time   WBC 11.6 10/14/2014   HGB 11.4* 10/14/2014   HCT 37* 10/14/2014   PLT 187 10/14/2014    CMP     Component Value Date/Time   NA 135* 10/02/2014   K 3.9 10/02/2014   BUN 23* 10/02/2014   CREATININE 0.6 02/21/2014   AST 21 10/02/2014   ALT 16 10/02/2014    Assessment and Plan  Depression Pt had had a GDR of zoloft to 100 mg daily and has tolerated it very  well; will cont to monitor  Prostate cancer Pt was recently seen by Oncology where family and pt reaffirmed to continue no treatment except for complications or pain due to metastatic dx; will monitor for signs or sx  In the 6 months before pt is seen by Onc again                                          B CONJUNCTIVITIS - start garamycin eye drops, 2 ggts QID for 10 days; use frash kleenex to touch eyes with  Hennie Duos, MD

## 2015-11-23 NOTE — Assessment & Plan Note (Signed)
Pt had had a GDR of zoloft to 100 mg daily and has tolerated it very well; will cont to monitor

## 2015-12-24 ENCOUNTER — Encounter: Payer: Self-pay | Admitting: Internal Medicine

## 2015-12-24 ENCOUNTER — Non-Acute Institutional Stay (SKILLED_NURSING_FACILITY): Payer: Medicare Other | Admitting: Internal Medicine

## 2015-12-24 DIAGNOSIS — Z794 Long term (current) use of insulin: Secondary | ICD-10-CM | POA: Diagnosis not present

## 2015-12-24 DIAGNOSIS — I739 Peripheral vascular disease, unspecified: Secondary | ICD-10-CM

## 2015-12-24 DIAGNOSIS — E118 Type 2 diabetes mellitus with unspecified complications: Secondary | ICD-10-CM | POA: Diagnosis not present

## 2015-12-24 DIAGNOSIS — I10 Essential (primary) hypertension: Secondary | ICD-10-CM

## 2015-12-24 NOTE — Progress Notes (Signed)
MRN: [id number] Name: Nashton Kwasniewski  Sex: male Age: 70 y.o. DOB: 1946/02/09  [facility] #:  Facility/Room:[facility] / [address]  Level Of Care: SNF Provider: Noah Delaine. Sheppard Coil, MD Emergency Contacts: Extended Emergency Contact Information Primary Emergency Contact: Meader,CAROLYN Address: [place],  [phone] Home Phone: [phone] Relation: None  Code Status: Full Code  Allergies: Codeine  Chief Complaint  Patient presents with  . Medical Management of Chronic Issues    HPI: Patient is 70 y.o. male who is being seen for routine issues of HTN, PAD and DM2.  Past Medical History  Diagnosis Date  . Allergy   . Anorexia   . Hypertension   . Hyperlipidemia   . Diabetes mellitus without complication ([place])   . CAD (coronary artery disease)   . Colon polyp   . Stroke ([place])   . Left hemiparesis ([place])   . GERD (gastroesophageal reflux disease)   . PVD (peripheral vascular disease) ([place])   . Diabetic retinopathy [facility])     Past Surgical History  Procedure Laterality Date  . Left bka    . Enteroscopic laser photocoagulation Left 2001      Medication List       This list is accurate as of: 12/24/15 11:59 PM.  Always use your most recent med list.               acetaminophen 325 MG tablet  Commonly known as:  TYLENOL  Take 650 mg by mouth every 6 (six) hours as needed for mild pain or moderate pain.     atorvastatin 10 MG tablet  Commonly known as:  LIPITOR  Take 10 mg by mouth at bedtime.     buPROPion 150 MG 12 hr tablet  Commonly known as:  WELLBUTRIN SR  Take 150 mg by mouth daily. For depression     clopidogrel 75 MG tablet  Commonly known as:  PLAVIX  Take 75 mg by mouth daily.     docusate sodium 100 MG capsule  Commonly known as:  COLACE  Take 300 mg by mouth 3 (three) times daily.     enalapril 5 MG tablet  Commonly known as:  VASOTEC  Take 5 mg by mouth daily.     feeding supplement (GLUCERNA SHAKE) Liqd  Take 237 mLs  by mouth 2 (two) times daily between meals.     fluticasone 50 MCG/ACT nasal spray  Commonly known as:  FLONASE  Place 1 spray into both nostrils 2 (two) times daily as needed for allergies or rhinitis.     meclizine 25 MG tablet  Commonly known as:  ANTIVERT  Take 25 mg by mouth 3 (three) times daily. For dizziness     metFORMIN 1000 MG tablet  Commonly known as:  GLUCOPHAGE  Take 1,000 mg by mouth 2 (two) times daily with a meal.     mirtazapine 15 MG tablet  Commonly known as:  REMERON  Take 7.5 mg by mouth at bedtime.     olopatadine 0.1 % ophthalmic solution  Commonly known as:  PATANOL  Place 1 drop into both eyes daily.     ondansetron 4 MG tablet  Commonly known as:  ZOFRAN  Take 4 mg by mouth every 6 (six) hours.     pantoprazole 40 MG tablet  Commonly known as:  PROTONIX  Take 40 mg by mouth daily. For reflux     sertraline 100 MG tablet  Commonly known as:  ZOLOFT  Take 100 mg by mouth daily.     tamsulosin 0.4 MG Caps capsule  Commonly known as:  FLOMAX  Take 0.4 mg by mouth at bedtime.     TOUJEO SOLOSTAR 300 UNIT/ML Sopn  Generic drug:  Insulin Glargine  Inject 15 Units into the skin at bedtime.     triamcinolone ointment 0.1 %  Commonly known as:  KENALOG  Apply 1 application topically 2 (two) times daily as needed. R leg     UNABLE TO FIND  Med Name: Med Pass : Initiate 4 oz of NSA medpass by mouth QID 2/2 inadequate by mouth intake        Meds ordered this encounter  Medications  . ondansetron (ZOFRAN) 4 MG tablet    Sig: Take 4 mg by mouth every 6 (six) hours.   . mirtazapine (REMERON) 15 MG tablet    Sig: Take 7.5 mg by mouth at bedtime.  . feeding supplement, GLUCERNA SHAKE, (GLUCERNA SHAKE) LIQD    Sig: Take 237 mLs by mouth 2 (two) times daily between meals.  Marland Kitchen DISCONTD: UNABLE TO FIND    Sig: Med Name: Med Pass : Initiate 4 oz of NSA medpass by mouth QID 2/2 inadequate by mouth intake    Immunization History  Administered Date(s)  Administered  . PPD Test 05/12/2009    Social History  Substance Use Topics  . Smoking status: Never Smoker   . Smokeless tobacco: Not on file  . Alcohol Use: Not on file    Review of Systems  DATA OBTAINED: from patient; pt has no c/o - he never has any c/o GENERAL:  no fevers, fatigue, appetite changes SKIN: No itching, rash HEENT: No complaint RESPIRATORY: No cough, wheezing, SOB CARDIAC: No chest pain, palpitations, lower extremity edema  GI: No abdominal pain, No N/V/D or constipation, No heartburn or reflux  GU: No dysuria, frequency or urgency, or incontinence  MUSCULOSKELETAL: No unrelieved bone/joint pain NEUROLOGIC: No headache, dizziness  PSYCHIATRIC: No overt anxiety or sadness  Filed Vitals:   12/24/15 1102  BP: 110/66  Pulse: 94  Temp: 97.8 F (36.6 C)  Resp: 24    Physical Exam  GENERAL APPEARANCE: Alert, conversant, No acute distress  SKIN: No diaphoresis rash HEENT: Unremarkable RESPIRATORY: Breathing is even, unlabored. Lung sounds are clear   CARDIOVASCULAR: Heart RRR no murmurs, rubs or gallops. No peripheral edema  GASTROINTESTINAL: Abdomen is soft, non-tender, not distended w/ normal bowel sounds.  GENITOURINARY: Bladder non tender, not distended  MUSCULOSKELETAL:L BKA NEUROLOGIC: Cranial nerves 2-12 grossly intact. Moves all extremities PSYCHIATRIC: Mood and affect appropriate to situation, no behavioral issues  Patient Active Problem List   Diagnosis Date Noted  . Leukocytosis 01/02/2016  . Anxiety state 07/02/2015  . Prostate cancer ([place]) 10/30/2014  . Fever presenting with conditions classified elsewhere 10/04/2014  . Acute urinary retention 09/16/2014  . Taking medication for chronic disease 09/07/2014  . Leukocytosis, unspecified 03/03/2014  . Peripheral vascular disease ([place]) 02/25/2014  . Conjunctivitis 02/25/2014  . Itching 10/23/2013  . Depression 04/17/2013  . Pruritic disorder 03/11/2013  . Urinary tract infection, site  not specified 02/27/2013  . Pure hypercholesterolemia 11/29/2012  . Type 1 diabetes mellitus with peripheral circulatory complications ([place]) [id number]  . 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 Hyperlipidemia LDL goal <70 11/11/2012  . Loss of weight 11/11/2012  . Essential hypertension, benign 11/11/2012  . Unspecified constipation 11/11/2012  . Type 2  diabetes with complication ([place]) 99991111  . Benign paroxysmal positional vertigo 11/11/2012  . CVA (cerebral vascular accident) ([place]) 11/11/2012  . Insomnia 11/11/2012  . GERD (gastroesophageal reflux disease) 11/11/2012    CBC    Component Value Date/Time   WBC 10.2 10/13/2015   HGB 11.9* 10/13/2015   HCT 36* 10/13/2015   PLT 175 10/13/2015    CMP     Component Value Date/Time   NA 140 10/08/2015   K 3.9 10/08/2015   BUN 26* 10/08/2015   CREATININE 0.7 10/08/2015   AST 21 10/02/2014   ALT 16 10/02/2014    Assessment and Plan  Essential hypertension, benign Chronic ans stable;cont vasotec 5 mg daily  Peripheral vascular disease Chronic and stable;wound care has not made me aware of any significant problems; R 3rd toe is healed; DM2 is controlled; will cont pt on plavix  Type 2 diabetes with complication Well [id number] 5.6;Cont current glucophage 1036m g BID and toujeo 15 u q Hs;pt on ACE and statin d/c    Webb Silversmith D. Sheppard Coil, MD

## 2015-12-30 ENCOUNTER — Non-Acute Institutional Stay (SKILLED_NURSING_FACILITY): Payer: Medicare Other | Admitting: Internal Medicine

## 2015-12-30 DIAGNOSIS — E1051 Type 1 diabetes mellitus with diabetic peripheral angiopathy without gangrene: Secondary | ICD-10-CM

## 2015-12-30 DIAGNOSIS — D72829 Elevated white blood cell count, unspecified: Secondary | ICD-10-CM

## 2015-12-30 LAB — CBC AND DIFFERENTIAL
HCT: 38 % — AB (ref 41–53)
Hemoglobin: 12.7 g/dL — AB (ref 13.5–17.5)
Platelets: 198 10*3/uL (ref 150–399)
WBC: 11.5 10^3/mL

## 2015-12-30 LAB — HEPATIC FUNCTION PANEL
ALK PHOS: 88 U/L (ref 25–125)
ALT: 11 U/L (ref 10–40)
AST: 19 U/L (ref 14–40)
BILIRUBIN, TOTAL: 0.3 mg/dL

## 2015-12-30 LAB — BASIC METABOLIC PANEL
BUN: 21 mg/dL (ref 4–21)
Creatinine: 0.7 mg/dL (ref 0.6–1.3)
Glucose: 36 mg/dL
Potassium: 4.1 mmol/L (ref 3.4–5.3)
Sodium: 140 mmol/L (ref 137–147)

## 2015-12-30 LAB — TSH: TSH: 1.67 u[IU]/mL (ref 0.41–5.90)

## 2015-12-30 LAB — HEMOGLOBIN A1C: Hemoglobin A1C: 5.3

## 2015-12-30 LAB — LIPID PANEL
Cholesterol: 138 mg/dL (ref 0–200)
HDL: 47 mg/dL (ref 35–70)
LDL Cholesterol: 76 mg/dL
Triglycerides: 77 mg/dL (ref 40–160)

## 2015-12-31 ENCOUNTER — Non-Acute Institutional Stay (SKILLED_NURSING_FACILITY): Payer: Medicare Other | Admitting: Internal Medicine

## 2015-12-31 DIAGNOSIS — H109 Unspecified conjunctivitis: Secondary | ICD-10-CM | POA: Diagnosis not present

## 2015-12-31 DIAGNOSIS — J189 Pneumonia, unspecified organism: Secondary | ICD-10-CM

## 2015-12-31 DIAGNOSIS — D72829 Elevated white blood cell count, unspecified: Secondary | ICD-10-CM | POA: Diagnosis not present

## 2015-12-31 NOTE — Progress Notes (Deleted)
Location:  [facility] Room Number: [address]/W Place of Service:  SNF [phone]) Provider:  Gr[place]ville Lewis  No primary care provider on file.  No care team member to display  Extended Emergency Contact Information Primary Emergency Contact: Shad,CAROLYN Address: [place],  [phone] Home Phone: [phone] Relation: None  Code Status:  Full Code Goals of care: Advanced Directive information Advanced Directives 12/31/2015  Does patient have an advance directive? Yes  Does patient want to make changes to advanced directive? No - Patient declined  Copy of advanced directive(s) in chart? Yes     Chief Complaint  Patient presents with  . Follow-up    Follow-up from pneumonia    HPI:  Pt is a 70 y.o. male seen today for an acute visit for    Past Medical History  Diagnosis Date  . Allergy   . Anorexia   . Hypertension   . Hyperlipidemia   . Diabetes mellitus without complication ([place])   . CAD (coronary artery disease)   . Colon polyp   . Stroke ([place])   . Left hemiparesis ([place])   . GERD (gastroesophageal reflux disease)   . PVD (peripheral vascular disease) ([place])   . Diabetic retinopathy [facility])    Past Surgical History  Procedure Laterality Date  . Left bka    . Enteroscopic laser photocoagulation Left 2001    Allergies  Allergen Reactions  . Codeine       Medication List       This list is accurate as of: 12/31/15  4:02 PM.  Always use your most recent med list.               buPROPion 150 MG 12 hr tablet  Commonly known as:  WELLBUTRIN SR  Take 150 mg by mouth daily. For depression     clopidogrel 75 MG tablet  Commonly known as:  PLAVIX  Take 75 mg by mouth daily.     docusate sodium 100 MG capsule  Commonly known as:  COLACE  Take 300 mg by mouth 3 (three) times daily.     enalapril 5 MG tablet  Commonly known as:  VASOTEC  Take 5 mg by mouth daily.     feeding supplement (GLUCERNA  SHAKE) Liqd  Take 237 mLs by mouth 2 (two) times daily between meals.     fluticasone 50 MCG/ACT nasal spray  Commonly known as:  FLONASE  Place 1 spray into both nostrils 2 (two) times daily as needed for allergies or rhinitis.     meclizine 25 MG tablet  Commonly known as:  ANTIVERT  Take 25 mg by mouth 3 (three) times daily. For dizziness     metFORMIN 1000 MG tablet  Commonly known as:  GLUCOPHAGE  Take 1,000 mg by mouth 2 (two) times daily with a meal.     mirtazapine 15 MG tablet  Commonly known as:  REMERON  Take 7.5 mg by mouth at bedtime.     olopatadine 0.1 % ophthalmic solution  Commonly known as:  PATANOL  Place 1 drop into both eyes daily.     ondansetron 4 MG tablet  Commonly known as:  ZOFRAN  Take 4 mg by mouth every 6 (six) hours.     pantoprazole 40 MG tablet  Commonly known as:  PROTONIX  Take 40 mg by mouth daily. For reflux     sertraline 100 MG  tablet  Commonly known as:  ZOLOFT  Take 100 mg by mouth daily.     tamsulosin 0.4 MG Caps capsule  Commonly known as:  FLOMAX  Take 0.4 mg by mouth at bedtime.     TOUJEO SOLOSTAR 300 UNIT/ML Sopn  Generic drug:  Insulin Glargine  Inject 15 Units into the skin at bedtime.     triamcinolone ointment 0.1 %  Commonly known as:  KENALOG  Apply 1 application topically 2 (two) times daily as needed. R leg        Review of Systems  Immunization History  Administered Date(s) Administered  . PPD Test 05/12/2009   Pertinent  Health Maintenance Due  Topic Date Due  . FOOT EXAM  07/24/2016 (Originally 10/16/1955)  . HEMOGLOBIN A1C  07/24/2016 (Originally 02/25/2015)  . OPHTHALMOLOGY EXAM  07/24/2016 (Originally 09/20/2011)  . COLONOSCOPY  07/24/2016 (Originally 10/16/1995)  . PNA vac Low Risk Adult (1 of 2 - PCV13) 07/24/2016 (Originally 10/16/2010)  . INFLUENZA VACCINE  02/22/2016   No flowsheet data found. Functional Status Survey:    There were no vitals filed for this visit. There is no weight on  file to calculate BMI. Physical Exam  Labs reviewed:  Recent Labs  10/08/15  NA 140  K 3.9  BUN 26*  CREATININE 0.7   No results for input(s): AST, ALT, ALKPHOS, BILITOT, PROT, ALBUMIN in the last 8760 hours.  Recent Labs  10/08/15 10/13/15  WBC 12.7 10.2  HGB 12.9* 11.9*  HCT 39* 36*  PLT 231 175   No results found for: TSH Lab Results  Component Value Date   HGBA1C 8.6* 08/27/2014   Lab Results  Component Value Date   CHOL 163 09/29/2015   HDL 35 09/29/2015   LDLCALC 104 09/29/2015   TRIG 122 09/29/2015    Significant Diagnostic Results in last 30 days:  No results found.  Assessment/Plan There are no diagnoses linked to this encounter.      Oralia Manis, [place]

## 2016-01-02 ENCOUNTER — Encounter: Payer: Self-pay | Admitting: Internal Medicine

## 2016-01-02 DIAGNOSIS — D72829 Elevated white blood cell count, unspecified: Secondary | ICD-10-CM | POA: Insufficient documentation

## 2016-01-02 NOTE — Progress Notes (Deleted)
Patient ID: Jeremiah Baldwin, male   DOB: 04/13/1946, 70 y.o.   MRN: [id number]

## 2016-01-02 NOTE — Progress Notes (Signed)
Patient ID: Jeremiah Baldwin, male   DOB: [birth year], 70 y.o.   MRN: [id number]  MRN: [id number] Name: Jeremiah Baldwin  Sex: male Age: 70 y.o. DOB: 02/15/1946  [facility] #:  Facility/Room:[facility] / [address]  Level Of Care: SNF Provider: Noah Delaine. Sheppard Coil, MD Emergency Contacts: Extended Emergency Contact Information Primary Emergency Contact: Mccann,CAROLYN Address: [place],  [phone] Home Phone: [phone] Relation: None  Code Status: Full Code  Allergies: Codeine  Chief Complaint  Patient presents with  . Follow-up    Follow-up from pneumonia  . Acute Visit  [place] to conjunctivitis    HPI: Patient is 70 y.o. male   who was seen yesterday for a mildly elevated white count slightly over 11,000-she has at times elevated white count at times has been diagnosed with UTI various times pneumonia.  We have ordered a urine culture which is pending-also ordered a chest x-ray which came back showing a left base infiltrate versus atelectasis.  He is afebrile but does have a history of pneumonia when he gets an elevated white count.  He also recently was treated for conjunctivitis with gentamicin eyedrops apparently this got better but today he again has some exudate in his left eye.  He does not complain of any burning or visual changes however    Past Medical History  Diagnosis Date  . Allergy   . Anorexia   . Hypertension   . Hyperlipidemia   . Diabetes mellitus without complication ([place])   . CAD (coronary artery disease)   . Colon polyp   . Stroke ([place])   . Left hemiparesis ([place])   . GERD (gastroesophageal reflux disease)   . PVD (peripheral vascular disease) ([place])   . Diabetic retinopathy [facility])     Past Surgical History  Procedure Laterality Date  . Left bka    . Enteroscopic laser photocoagulation Left 2001      Medication List       This list is accurate as of: 12/31/15 11:59 PM.  Always use your most recent med list.                buPROPion 150 MG 12 hr tablet  Commonly known as:  WELLBUTRIN SR  Take 150 mg by mouth daily. For depression     clopidogrel 75 MG tablet  Commonly known as:  PLAVIX  Take 75 mg by mouth daily.     docusate sodium 100 MG capsule  Commonly known as:  COLACE  Take 300 mg by mouth 3 (three) times daily.     enalapril 5 MG tablet  Commonly known as:  VASOTEC  Take 5 mg by mouth daily.     feeding supplement (GLUCERNA SHAKE) Liqd  Take 237 mLs by mouth 2 (two) times daily between meals.     fluticasone 50 MCG/ACT nasal spray  Commonly known as:  FLONASE  Place 1 spray into both nostrils 2 (two) times daily as needed for allergies or rhinitis.     meclizine 25 MG tablet  Commonly known as:  ANTIVERT  Take 25 mg by mouth 3 (three) times daily. For dizziness     metFORMIN 1000 MG tablet  Commonly known as:  GLUCOPHAGE  Take 1,000 mg by mouth 2 (two) times daily with a meal.     mirtazapine 15 MG tablet  Commonly known as:  REMERON  Take 7.5 mg by mouth at bedtime.     olopatadine  0.1 % ophthalmic solution  Commonly known as:  PATANOL  Place 1 drop into both eyes daily.     ondansetron 4 MG tablet  Commonly known as:  ZOFRAN  Take 4 mg by mouth every 6 (six) hours.     pantoprazole 40 MG tablet  Commonly known as:  PROTONIX  Take 40 mg by mouth daily. For reflux     sertraline 100 MG tablet  Commonly known as:  ZOLOFT  Take 100 mg by mouth daily.     tamsulosin 0.4 MG Caps capsule  Commonly known as:  FLOMAX  Take 0.4 mg by mouth at bedtime.     TOUJEO SOLOSTAR 300 UNIT/ML Sopn  Generic drug:  Insulin Glargine  Inject 15 Units into the skin at bedtime.     triamcinolone ointment 0.1 %  Commonly known as:  KENALOG  Apply 1 application topically 2 (two) times daily as needed. R leg        No orders of the defined types were placed in this encounter.    Immunization History  Administered Date(s) Administered  . PPD Test 05/12/2009    Social History   Substance Use Topics  . Smoking status: Never Smoker   . Smokeless tobacco: Not on file  . Alcohol Use: Not on file    Review of Systems  DATA OBTAINED: from patient, nurse, GENERAL:  no fevers, fatigue, appetite changes SKIN: No itching, rash HEENT: No complaintHave noticed some redness and drainage from his left eye RESPIRATORY: No cough, wheezing, SOB-chest x-ray suspicious for pneumonia CARDIAC: No chest pain, palpitations, lower extremity edema  GI: No abdominal pain, No N/V/D or constipation, No heartburn or reflux  GU: No dysuria, frequency or urgency, or incontinence  MUSCULOSKELETAL: No unrelieved bone/joint pain NEUROLOGIC: No headache, dizziness  PSYCHIATRIC: No overt anxiety or sadness   Physical Exam  He is afebrile pulse of 88 respirations 19 blood pressure is pending  GENERAL APPEARANCE: Alert, conversant, No acute distress  SKIN: No diaphoresis rash Oropharynx is clear mucous membranes moist Eyes-she does have erythematous conjunctiva lon the left he also has some cream-colored exudate the medial margin of his left eye-pupisl appears reactive to light extraocular movements intact visual acuity appears to be at baseline Right eye remains within normal limits a do not see any increased erythema or exudate RESPIRATORY: Breathing is even, unlabored. Lung sounds are clear  with poor respiratory effort shallow air entry CARDIOVASCULAR: Heart RRR no murmurs, rubs or gallops. No peripheral edema  GASTROINTESTINAL: Abdomen is soft, non-tender, not distended w/ normal bowel sounds.  GENITOURINARY: Bladder non tender, not distended not really appreciate suprapubic tenderness MUSCULOSKELETAL: Have some left-sided weakness with contracture left upper extremity is left AKA  NEUROLOGIC: Cranial nerves 2-12 grossly intact. Side weakness PSYCHIATRIC: Mood and affect appropriate to situation, no behavioral issues  Patient Active Problem List   Diagnosis Date Noted  .  Leukocytosis 01/02/2016  . Anxiety state 07/02/2015  . Prostate cancer ([place]) 10/30/2014  . Fever presenting with conditions classified elsewhere 10/04/2014  . Acute urinary retention 09/16/2014  . Taking medication for chronic disease 09/07/2014  . Leukocytosis, unspecified 03/03/2014  . Peripheral vascular disease ([place]) 02/25/2014  . Conjunctivitis 02/25/2014  . Itching 10/23/2013  . Depression 04/17/2013  . Pruritic disorder 03/11/2013  . Urinary tract infection, site not specified 02/27/2013  . Pure hypercholesterolemia 11/29/2012  . 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 Hyperlipidemia LDL goal <70 11/11/2012  . Loss of weight 11/11/2012  . Essential hypertension, benign 11/11/2012  . Unspecified constipation 11/11/2012  . Type 2 diabetes with complication ([place]) 99991111  . Benign paroxysmal positional vertigo 11/11/2012  . CVA (cerebral vascular accident) ([place]) 11/11/2012  . Insomnia 11/11/2012  . GERD (gastroesophageal reflux disease) 11/11/2012   Most recent lab significant for white count of 11.5 hemoglobin 12.7 platelets 198.  Sodium 140 potassium 4.1 BUN 21 creatinine 0.68 hemoglobin A1c most recently 5.3  CBC    Component Value Date/Time   WBC 10.2 10/13/2015   HGB 11.9* 10/13/2015   HCT 36* 10/13/2015   PLT 175 10/13/2015    CMP     Component Value Date/Time   NA 140 10/08/2015   K 3.9 10/08/2015   BUN 26* 10/08/2015   CREATININE 0.7 10/08/2015   AST 21 10/02/2014   ALT 16 10/02/2014    Assessment and Plan  #1-elevated white count with chest x-ray concerning for possible pneumonia-the patient's history will treat aggressively with Levaquin 500 milligrams daily for 1 day 250 mg for 9 additional days-  A also duo nebs every 6 hours when necessary.  #2 conjunctivitis will restart gentamicin eyedrops 2 drops left eye  4 times a day for 10 days and monitor for resolution or any changes-- if this reoccurs consider obtaining a culture  [id number]

## 2016-01-02 NOTE — Progress Notes (Signed)
Patient ID: Henson Swineford, male   DOB: 05/18/1946, 70 y.o.   MRN: [id number] MRN: [id number] Name: Jeremiah Baldwin  Sex: male Age: 70 y.o. DOB: 18-Jan-1946  [facility] #:  Facility/Room:[facility] / [address]  Level Of Care: SNF Provider: Noah Delaine. Sheppard Coil, MD Emergency Contacts: Extended Emergency Contact Information Primary Emergency Contact: Fuqua,CAROLYN Address: [place],  [phone] Home Phone: [phone] Relation: None  Code Status: Full Code  Allergies: Codeine  Chief Complaint  Patient presents with  . Acute Visit  Secondary to leukocytosis unclear etiology-follow-up CBGs.    HPI: Patient is 70 y.o. male who who is seen for lab work that came back with a mildly elevated white count of 11.5-patient does have a history of leukocytosis at times-at one point had been treated for UTI-also has been treated for pneumonia in the past he's not really coughing or complaining of any increased congestion or dysuria at this time.  He is also listed is a type I diabetic he is on Toujeo 15 units daily at bedtime-she is also on Glucophage thousand milligrams twice a day.  Metabolic panel recently showed a glucose of 36 although apparently this was a hemolyzed sample-I did review his blood sugars however and appears in the morning he runs somewhat low from the 60s to the low 100s later in the day he will at times still have sugars in the 60s and 70s although I do see some in the lower 100s as well.  Apparently he has been asymptomatic  Past Medical History  Diagnosis Date  . Allergy   . Anorexia   . Hypertension   . Hyperlipidemia   . Diabetes mellitus without complication ([place])   . CAD (coronary artery disease)   . Colon polyp   . Stroke ([place])   . Left hemiparesis ([place])   . GERD (gastroesophageal reflux disease)   . PVD (peripheral vascular disease) ([place])   . Diabetic retinopathy [facility])     Past Surgical History  Procedure Laterality Date  . Left bka    .  Enteroscopic laser photocoagulation Left 2001      Medication List       This list is accurate as of: 12/30/15 11:59 PM.  Always use your most recent med list.               buPROPion 150 MG 12 hr tablet  Commonly known as:  WELLBUTRIN SR  Take 150 mg by mouth daily. For depression     clopidogrel 75 MG tablet  Commonly known as:  PLAVIX  Take 75 mg by mouth daily.     docusate sodium 100 MG capsule  Commonly known as:  COLACE  Take 300 mg by mouth 3 (three) times daily.     enalapril 5 MG tablet  Commonly known as:  VASOTEC  Take 5 mg by mouth daily.     feeding supplement (GLUCERNA SHAKE) Liqd  Take 237 mLs by mouth 2 (two) times daily between meals.     fluticasone 50 MCG/ACT nasal spray  Commonly known as:  FLONASE  Place 1 spray into both nostrils 2 (two) times daily as needed for allergies or rhinitis.     meclizine 25 MG tablet  Commonly known as:  ANTIVERT  Take 25 mg by mouth 3 (three) times daily. For dizziness     metFORMIN 1000 MG tablet  Commonly known as:  GLUCOPHAGE  Take 1,000 mg by  mouth 2 (two) times daily with a meal.     mirtazapine 15 MG tablet  Commonly known as:  REMERON  Take 7.5 mg by mouth at bedtime.     olopatadine 0.1 % ophthalmic solution  Commonly known as:  PATANOL  Place 1 drop into both eyes daily.     ondansetron 4 MG tablet  Commonly known as:  ZOFRAN  Take 4 mg by mouth every 6 (six) hours.     pantoprazole 40 MG tablet  Commonly known as:  PROTONIX  Take 40 mg by mouth daily. For reflux     sertraline 100 MG tablet  Commonly known as:  ZOLOFT  Take 100 mg by mouth daily.     tamsulosin 0.4 MG Caps capsule  Commonly known as:  FLOMAX  Take 0.4 mg by mouth at bedtime.     TOUJEO SOLOSTAR 300 UNIT/ML Sopn  Generic drug:  Insulin Glargine  Inject 15 Units into the skin at bedtime.     triamcinolone ointment 0.1 %  Commonly known as:  KENALOG  Apply 1 application topically 2 (two) times daily as needed. R leg         No orders of the defined types were placed in this encounter.    Immunization History  Administered Date(s) Administered  . PPD Test 05/12/2009    Social History  Substance Use Topics  . Smoking status: Never Smoker   . Smokeless tobacco: Not on file  . Alcohol Use: Not on file    Review of Systems  DATA OBTAINED: from patient, nurse, GENERAL:  no fevers, fatigue, appetite changes SKIN: No itching, rash HEENT: No complaint RESPIRATORY: No cough, wheezing, SOB CARDIAC: No chest pain, palpitations, lower extremity edema  GI: No abdominal pain, No N/V/D or constipation, No heartburn or reflux  GU: No dysuria, frequency or urgency, or incontinence  MUSCULOSKELETAL: No unrelieved bone/joint pain NEUROLOGIC: No headache, dizziness  PSYCHIATRIC: No overt anxiety or sadness  Filed Vitals:   01/02/16 1110  Pulse: 80  Resp: 17  He is afebrile  Physical Exam  GENERAL APPEARANCE: Alert, conversant, No acute distress  SKIN: No diaphoresis rash Oropharynx is clear mucous membranes moist RESPIRATORY: Breathing is even, unlabored. Lung sounds are clear  with poor respiratory effort shallow air entry CARDIOVASCULAR: Heart RRR no murmurs, rubs or gallops. No peripheral edema  GASTROINTESTINAL: Abdomen is soft, non-tender, not distended w/ normal bowel sounds.  GENITOURINARY: Bladder non tender, not distended not really appreciate suprapubic tenderness MUSCULOSKELETAL: Have some left-sided weakness with contracture left upper extremity is left AKA  NEUROLOGIC: Cranial nerves 2-12 grossly intact. Side weakness PSYCHIATRIC: Mood and affect appropriate to situation, no behavioral issues  Patient Active Problem List   Diagnosis Date Noted  . Leukocytosis 01/02/2016  . Anxiety state 07/02/2015  . Prostate cancer ([place]) 10/30/2014  . Fever presenting with conditions classified elsewhere 10/04/2014  . Acute urinary retention 09/16/2014  . Taking medication for chronic disease  09/07/2014  . Leukocytosis, unspecified 03/03/2014  . Peripheral vascular disease ([place]) 02/25/2014  . Conjunctivitis 02/25/2014  . Itching 10/23/2013  . Depression 04/17/2013  . Pruritic disorder 03/11/2013  . Urinary tract infection, site not specified 02/27/2013  . Pure hypercholesterolemia 11/29/2012  . 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 Hyperlipidemia LDL goal <70 11/11/2012  . Loss of weight 11/11/2012  . Essential hypertension, benign 11/11/2012  . Unspecified constipation 11/11/2012  .  Type 2 diabetes with complication ([place]) 99991111  . Benign paroxysmal positional vertigo 11/11/2012  . CVA (cerebral vascular accident) ([place]) 11/11/2012  . Insomnia 11/11/2012  . GERD (gastroesophageal reflux disease) 11/11/2012   Most recent lab significant for white count of 11.5 hemoglobin 12.7 platelets 198.  Sodium 140 potassium 4.1 BUN 21 creatinine 0.68 hemoglobin A1c most recently 5.3  CBC    Component Value Date/Time   WBC 10.2 10/13/2015   HGB 11.9* 10/13/2015   HCT 36* 10/13/2015   PLT 175 10/13/2015    CMP     Component Value Date/Time   NA 140 10/08/2015   K 3.9 10/08/2015   BUN 26* 10/08/2015   CREATININE 0.7 10/08/2015   AST 21 10/02/2014   ALT 16 10/02/2014    Assessment and Plan  1 leukocytosis unclear etiology-patient does have history UTIs and pneumonia will check a chest x-ray 2 view as well as a UA C&S.  Also update CBC with differential and metabolic panel tomorrow for updated values.  #2 history of diabetes type 1-concern somewhat with the lower blood sugars although apparently is been asymptomatic we'll decrease his Toujeo to 10 units daily at bedtime and monitor.  [id number] note greater than 25 minutes spent assessing patient-discussing his status with nursing staff-reviewing his chart-his labs-his blood  sugars-and coordinating and formulating a plan of care-of note greater than 50% of time spent correlating planof care with research as noted above

## 2016-01-03 LAB — CBC AND DIFFERENTIAL
HCT: 36 % — AB (ref 41–53)
HEMOGLOBIN: 11.9 g/dL — AB (ref 13.5–17.5)
Platelets: 176 10*3/uL (ref 150–399)
WBC: 10.7 10*3/mL

## 2016-01-03 LAB — BASIC METABOLIC PANEL
BUN: 15 mg/dL (ref 4–21)
Creatinine: 0.7 mg/dL (ref 0.6–1.3)
Glucose: 58 mg/dL
POTASSIUM: 4 mmol/L (ref 3.4–5.3)
SODIUM: 136 mmol/L — AB (ref 137–147)

## 2016-01-14 ENCOUNTER — Encounter: Payer: Self-pay | Admitting: Internal Medicine

## 2016-01-14 NOTE — Assessment & Plan Note (Signed)
Chronic and stable;wound care has not made me aware of any significant problems; R 3rd toe is healed; DM2 is controlled; will cont pt on plavix

## 2016-01-14 NOTE — Assessment & Plan Note (Signed)
Well controlled;A1c 5.6;Cont current glucophage 1043m g BID and toujeo 15 u q Hs;pt on ACE and statin d/c

## 2016-01-14 NOTE — Assessment & Plan Note (Signed)
Chronic ans stable;cont vasotec 5 mg daily

## 2016-02-02 ENCOUNTER — Non-Acute Institutional Stay (SKILLED_NURSING_FACILITY): Payer: Medicare Other | Admitting: Internal Medicine

## 2016-02-02 ENCOUNTER — Encounter: Payer: Self-pay | Admitting: Internal Medicine

## 2016-02-02 DIAGNOSIS — F329 Major depressive disorder, single episode, unspecified: Secondary | ICD-10-CM

## 2016-02-02 DIAGNOSIS — K219 Gastro-esophageal reflux disease without esophagitis: Secondary | ICD-10-CM

## 2016-02-02 DIAGNOSIS — I6330 Cerebral infarction due to thrombosis of unspecified cerebral artery: Secondary | ICD-10-CM | POA: Diagnosis not present

## 2016-02-02 DIAGNOSIS — F32A Depression, unspecified: Secondary | ICD-10-CM

## 2016-02-02 NOTE — Progress Notes (Signed)
MRN: [id number] Name: Jeremiah Baldwin  Sex: male Age: 70 y.o. DOB: 12/05/1945  [facility] #:  Facility/Room: [facility] / [address] Level Of Care: SNF Provider: Noah Delaine. Sheppard Coil, MD Emergency Contacts: Extended Emergency Contact Information Primary Emergency Contact: Weible,CAROLYN Address: [place],  [phone] Home Phone: [phone] Relation: None  Code Status: Full Code  Allergies: Codeine  Chief Complaint  Patient presents with  . Medical Management of Chronic Issues    Routine Visit    HPI: Patient is 70 y.o. male who is being seen for routine issues of s/p CVA, GERD and depression.  Past Medical History  Diagnosis Date  . Allergy   . Anorexia   . Hypertension   . Hyperlipidemia   . Diabetes mellitus without complication ([place])   . CAD (coronary artery disease)   . Colon polyp   . Stroke ([place])   . Left hemiparesis ([place])   . GERD (gastroesophageal reflux disease)   . PVD (peripheral vascular disease) ([place])   . Diabetic retinopathy [facility])     Past Surgical History  Procedure Laterality Date  . Left bka    . Enteroscopic laser photocoagulation Left 2001      Medication List       This list is accurate as of: 02/02/16  1:13 PM.  Always use your most recent med list.               buPROPion 150 MG 12 hr tablet  Commonly known as:  WELLBUTRIN SR  Take 150 mg by mouth daily. For depression     clopidogrel 75 MG tablet  Commonly known as:  PLAVIX  Take 75 mg by mouth daily.     docusate sodium 100 MG capsule  Commonly known as:  COLACE  Take 300 mg by mouth 3 (three) times daily.     enalapril 5 MG tablet  Commonly known as:  VASOTEC  Take 5 mg by mouth daily.     feeding supplement (GLUCERNA SHAKE) Liqd  Take 237 mLs by mouth 2 (two) times daily between meals. Between breakfast and lunch     fluticasone 50 MCG/ACT nasal spray  Commonly known as:  FLONASE  Place 1 spray into both nostrils 2 (two) times daily as needed for  allergies or rhinitis.     meclizine 25 MG tablet  Commonly known as:  ANTIVERT  Take 25 mg by mouth 3 (three) times daily. For dizziness     metFORMIN 1000 MG tablet  Commonly known as:  GLUCOPHAGE  Take 1,000 mg by mouth 2 (two) times daily with a meal.     mirtazapine 15 MG tablet  Commonly known as:  REMERON  Take 7.5 mg by mouth at bedtime.     olopatadine 0.1 % ophthalmic solution  Commonly known as:  PATANOL  Place 1 drop into both eyes daily.     ondansetron 4 MG tablet  Commonly known as:  ZOFRAN  Take 4 mg by mouth every 6 (six) hours.     pantoprazole 40 MG tablet  Commonly known as:  PROTONIX  Take 40 mg by mouth daily. For reflux     sertraline 100 MG tablet  Commonly known as:  ZOLOFT  Take 100 mg by mouth daily.     tamsulosin 0.4 MG Caps capsule  Commonly known as:  FLOMAX  Take 0.4 mg by mouth at bedtime.     TOUJEO SOLOSTAR 300  UNIT/ML Sopn  Generic drug:  Insulin Glargine  Inject 15 Units into the skin at bedtime.     triamcinolone ointment 0.1 %  Commonly known as:  KENALOG  Apply 1 application topically 2 (two) times daily as needed. Right lower leg        No orders of the defined types were placed in this encounter.    Immunization History  Administered Date(s) Administered  . PPD Test 05/12/2009    Social History  Substance Use Topics  . Smoking status: Never Smoker   . Smokeless tobacco: Not on file  . Alcohol Use: Not on file    Review of Systems  DATA OBTAINED: from patient, nurse; pt has no c/o-he never has c/o even though I ask him several times GENERAL:  no fevers, fatigue, appetite changes SKIN: No itching, rash HEENT: No complaint RESPIRATORY: No cough, wheezing, SOB CARDIAC: No chest pain, palpitations, lower extremity edema  GI: No abdominal pain, No N/V/D or constipation, No heartburn or reflux  GU: No dysuria, frequency or urgency, or incontinence  MUSCULOSKELETAL: No unrelieved bone/joint pain NEUROLOGIC: No  headache, dizziness  PSYCHIATRIC: No overt anxiety or sadness  Filed Vitals:   02/02/16 1255  BP: 110/66  Pulse: 91  Temp: 98 F (36.7 C)  Resp: 16    Physical Exam  GENERAL APPEARANCE: Alert, conversant, No acute distress  SKIN: No diaphoresis rash HEENT: Unremarkable RESPIRATORY: Breathing is even, unlabored. Lung sounds are clear   CARDIOVASCULAR: Heart RRR no murmurs, rubs or gallops. No peripheral edema  GASTROINTESTINAL: Abdomen is soft, non-tender, not distended w/ normal bowel sounds.  GENITOURINARY: Bladder non tender, not distended  MUSCULOSKELETAL: L side wasting NEUROLOGIC: Cranial nerves 2-12 grossly intact; L hemiplegia PSYCHIATRIC: Mood and affect appropriate to situation, no behavioral issues  Patient Active Problem List   Diagnosis Date Noted  . Leukocytosis 01/02/2016  . Anxiety state 07/02/2015  . Prostate cancer ([place]) 10/30/2014  . Fever presenting with conditions classified elsewhere 10/04/2014  . Acute urinary retention 09/16/2014  . Taking medication for chronic disease 09/07/2014  . Leukocytosis, unspecified 03/03/2014  . Peripheral vascular disease ([place]) 02/25/2014  . Conjunctivitis 02/25/2014  . Itching 10/23/2013  . Depression 04/17/2013  . Pruritic disorder 03/11/2013  . Urinary tract infection, site not specified 02/27/2013  . Pure hypercholesterolemia 11/29/2012  . 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 Hyperlipidemia LDL goal <70 11/11/2012  . Loss of weight 11/11/2012  . Essential hypertension, benign 11/11/2012  . Unspecified constipation 11/11/2012  . Type 2 diabetes with complication ([place]) 99991111  . Benign paroxysmal positional vertigo 11/11/2012  . CVA (cerebral vascular accident) ([place]) 11/11/2012  . Insomnia 11/11/2012  . GERD (gastroesophageal reflux disease) 11/11/2012    CBC     Component Value Date/Time   WBC 10.7 01/03/2016   HGB 11.9* 01/03/2016   HCT 36* 01/03/2016   PLT 176 01/03/2016    CMP     Component Value Date/Time   NA 136* 01/03/2016   K 4.0 01/03/2016   BUN 15 01/03/2016   CREATININE 0.7 01/03/2016   AST 19 12/30/2015   ALT 11 12/30/2015   ALKPHOS 88 12/30/2015    Assessment and Plan  Depression Pt had had a GDR of zoloft to 100 mg daily and has tolerated it very well; will cont to monitor  CVA (cerebral vascular accident) Stable, no new deficitis; plan - cont plavix;  pt has ben taken off statin because it appears pt doesn't need it  GERD (gastroesophageal reflux disease) No signs of sx of reflux even though pt eats in bed daily;cont ptotonix 40 mg    Noah Delaine. Sheppard Coil, MD

## 2016-02-13 ENCOUNTER — Encounter: Payer: Self-pay | Admitting: Internal Medicine

## 2016-02-25 ENCOUNTER — Encounter: Payer: Self-pay | Admitting: Internal Medicine

## 2016-02-25 ENCOUNTER — Non-Acute Institutional Stay (SKILLED_NURSING_FACILITY): Payer: Medicare Other | Admitting: Internal Medicine

## 2016-02-25 DIAGNOSIS — I6330 Cerebral infarction due to thrombosis of unspecified cerebral artery: Secondary | ICD-10-CM

## 2016-02-25 DIAGNOSIS — K219 Gastro-esophageal reflux disease without esophagitis: Secondary | ICD-10-CM

## 2016-02-25 DIAGNOSIS — E785 Hyperlipidemia, unspecified: Secondary | ICD-10-CM | POA: Diagnosis not present

## 2016-02-25 NOTE — Progress Notes (Signed)
MRN: [id number] Name: Jeremiah Baldwin  Sex: male Age: 70 y.o. DOB: 12-08-1945  [place] #:  Facility/Room: [facility] / [address] Level Of Care: SNF Provider: Noah Delaine. Sheppard Coil, MD Emergency Contacts: Extended Emergency Contact Information Primary Emergency Contact: Seppala,CAROLYN Address: [place],  [phone] Home Phone: [phone] Relation: None  Code Status: Full Code  Allergies: Codeine  Chief Complaint  Patient presents with  . Medical Management of Chronic Issues    Routine Visit    HPI: Patient is 70 y.o. male who is being seen for routine issues of HLD, GERD and prior CVA.  Past Medical History:  Diagnosis Date  . Allergy   . Anorexia   . CAD (coronary artery disease)   . Colon polyp   . Diabetes mellitus without complication ([place])   . Diabetic retinopathy ([place])   . GERD (gastroesophageal reflux disease)   . Hyperlipidemia   . Hypertension   . Left hemiparesis ([place])   . PVD (peripheral vascular disease) ([place])   . Stroke [facility])     Past Surgical History:  Procedure Laterality Date  . enteroscopic laser photocoagulation Left 2001  . left bka        Medication List       Accurate as of 02/25/16 11:19 AM. Always use your most recent med list.          buPROPion 150 MG 12 hr tablet Commonly known as:  WELLBUTRIN SR Take 150 mg by mouth daily. For depression   clopidogrel 75 MG tablet Commonly known as:  PLAVIX Take 75 mg by mouth daily.   docusate sodium 100 MG capsule Commonly known as:  COLACE Take 200 mg by mouth 3 (three) times daily.   enalapril 5 MG tablet Commonly known as:  VASOTEC Take 5 mg by mouth daily.   feeding supplement (GLUCERNA SHAKE) Liqd Take 237 mLs by mouth 2 (two) times daily between meals. Between breakfast and lunch   NUTRITIONAL SUPPLEMENT Liqd Initiate 4 oz of NSA medpass by mouth 4 times daily   ipratropium-albuterol 0.5-2.5 (3) MG/3ML Soln Commonly known as:  DUONEB Take 3 mLs by  nebulization every 6 (six) hours as needed. Sob, congestion/wheezing   meclizine 25 MG tablet Commonly known as:  ANTIVERT Take 25 mg by mouth 3 (three) times daily. For dizziness   metFORMIN 1000 MG tablet Commonly known as:  GLUCOPHAGE Take 1,000 mg by mouth 2 (two) times daily with a meal.   mirtazapine 15 MG tablet Commonly known as:  REMERON Take 7.5 mg by mouth at bedtime.   olopatadine 0.1 % ophthalmic solution Commonly known as:  PATANOL Place 1 drop into both eyes daily.   ondansetron 4 MG tablet Commonly known as:  ZOFRAN Take 4 mg by mouth every 6 (six) hours.   pantoprazole 40 MG tablet Commonly known as:  PROTONIX Take 40 mg by mouth daily. For reflux   sertraline 100 MG tablet Commonly known as:  ZOLOFT Take 100 mg by mouth daily.   tamsulosin 0.4 MG Caps capsule Commonly known as:  FLOMAX Take 0.4 mg by mouth at bedtime.   TOUJEO SOLOSTAR 300 UNIT/ML Sopn Generic drug:  Insulin Glargine Inject 10 Units into the skin at bedtime.   triamcinolone ointment 0.1 % Commonly known as:  KENALOG Apply 1 application topically daily as needed. Right lower leg       Meds ordered this encounter  Medications  . ipratropium-albuterol (DUONEB)  0.5-2.5 (3) MG/3ML SOLN    Sig: Take 3 mLs by nebulization every 6 (six) hours as needed. Sob, congestion/wheezing  . NUTRITIONAL SUPPLEMENT LIQD    Sig: Initiate 4 oz of NSA medpass by mouth 4 times daily    Immunization History  Administered Date(s) Administered  . PPD Test 05/12/2009    Social History  Substance Use Topics  . Smoking status: Never Smoker  . Smokeless tobacco: Not on file  . Alcohol use Not on file    Review of Systems  DATA OBTAINED: from patient, nurse GENERAL:  no fevers, fatigue, appetite changes SKIN: No itching, rash HEENT: No complaint RESPIRATORY: No cough, wheezing, SOB CARDIAC: No chest pain, palpitations, lower extremity edema  GI: No abdominal pain, No N/V/D or constipation,  No heartburn or reflux  GU: No dysuria, frequency or urgency, or incontinence  MUSCULOSKELETAL: No unrelieved bone/joint pain NEUROLOGIC: No headache, dizziness  PSYCHIATRIC: No overt anxiety or sadness  Vitals:   02/25/16 1107  BP: 110/66  Pulse: 89  Resp: 18  Temp: 98 F (36.7 C)    Physical Exam  GENERAL APPEARANCE: Alert, conversant, No acute distress  SKIN: No diaphoresis rash HEENT: Unremarkable RESPIRATORY: Breathing is even, unlabored. Lung sounds are clear   CARDIOVASCULAR: Heart RRR no murmurs, rubs or gallops. No peripheral edema  GASTROINTESTINAL: Abdomen is soft, non-tender, not distended w/ normal bowel sounds.  GENITOURINARY: Bladder non tender, not distended  MUSCULOSKELETAL: wasting L side NEUROLOGIC: Cranial nerves 2-12 grossly intact.L side weakness PSYCHIATRIC: Mood and affect appropriate to situation, no behavioral issues  Patient Active Problem List   Diagnosis Date Noted  . Leukocytosis 01/02/2016  . Anxiety state 07/02/2015  . Prostate cancer ([place]) 10/30/2014  . Fever presenting with conditions classified elsewhere 10/04/2014  . Acute urinary retention 09/16/2014  . Taking medication for chronic disease 09/07/2014  . Leukocytosis, unspecified 03/03/2014  . Peripheral vascular disease ([place]) 02/25/2014  . Conjunctivitis 02/25/2014  . Itching 10/23/2013  . Depression 04/17/2013  . Pruritic disorder 03/11/2013  . Urinary tract infection, site not specified 02/27/2013  . Pure hypercholesterolemia 11/29/2012  . 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 Hyperlipidemia LDL goal <70 11/11/2012  . Loss of weight 11/11/2012  . Essential hypertension, benign 11/11/2012  . Unspecified constipation 11/11/2012  . Type 2 diabetes with complication ([place]) 99991111  . Benign paroxysmal positional vertigo 11/11/2012  . CVA  (cerebral vascular accident) ([place]) 11/11/2012  . Insomnia 11/11/2012  . GERD (gastroesophageal reflux disease) 11/11/2012    CBC    Component Value Date/Time   WBC 10.7 01/03/2016   HGB 11.9 (A) 01/03/2016   HCT 36 (A) 01/03/2016   PLT 176 01/03/2016    CMP     Component Value Date/Time   NA 136 (A) 01/03/2016   K 4.0 01/03/2016   BUN 15 01/03/2016   CREATININE 0.7 01/03/2016   AST 19 12/30/2015   ALT 11 12/30/2015   ALKPHOS 88 12/30/2015    Assessment and Plan  CVA (cerebral vascular accident) Stable, no new deficitis; plan - cont plavix; pt has been taken off statin because it appears pt doesn't need it  GERD (gastroesophageal reflux disease) No signs of sx of reflux even though pt eats in bed daily;cont ptotonix 40 mg  Hyperlipidemia LDL goal <70 Lipids in 12/2015 LDL 76, HDL 46; very good control without statins    Eduardo Honor D. Sheppard Coil, MD

## 2016-03-23 ENCOUNTER — Non-Acute Institutional Stay (SKILLED_NURSING_FACILITY): Payer: Medicare Other | Admitting: Internal Medicine

## 2016-03-23 ENCOUNTER — Encounter: Payer: Self-pay | Admitting: Internal Medicine

## 2016-03-23 DIAGNOSIS — M86171 Other acute osteomyelitis, right ankle and foot: Secondary | ICD-10-CM

## 2016-03-23 NOTE — Progress Notes (Signed)
MRN: [id number] Name: Jeremiah Baldwin  Sex: male Age: 70 y.o. DOB: April 26, 1946  [place] #:  Facility/Room: [facility] / [address] Level Of Care: SNF Provider: Noah Delaine. Sheppard Coil, MD Emergency Contacts: Extended Emergency Contact Information Primary Emergency Contact: Ewan,CAROLYN Address: [place],  [phone] Home Phone: [phone] Relation: None  Code Status: Full Code   Allergies: Codeine  Chief Complaint  Patient presents with  . Acute Visit    Acute    HPI: Patient is 70 y.o. male who is being seen because a bone bx done by wound care mid level came back + for osteomyelitis. Pt has no c/o, he never does.He has had non healing wound R 4th toe.  Past Medical History:  Diagnosis Date  . Allergy   . Anorexia   . CAD (coronary artery disease)   . Colon polyp   . Diabetes mellitus without complication ([place])   . Diabetic retinopathy ([place])   . GERD (gastroesophageal reflux disease)   . Hyperlipidemia   . Hypertension   . Left hemiparesis ([place])   . PVD (peripheral vascular disease) ([place])   . Stroke [facility])     Past Surgical History:  Procedure Laterality Date  . enteroscopic laser photocoagulation Left 2001  . left bka        Medication List       Accurate as of 03/23/16 11:59 PM. Always use your most recent med list.          buPROPion 150 MG 12 hr tablet Commonly known as:  WELLBUTRIN SR Take 150 mg by mouth daily. For depression   clopidogrel 75 MG tablet Commonly known as:  PLAVIX Take 75 mg by mouth daily.   docusate sodium 100 MG capsule Commonly known as:  COLACE Take 200 mg by mouth 3 (three) times daily.   enalapril 5 MG tablet Commonly known as:  VASOTEC Take 5 mg by mouth daily.   feeding supplement (GLUCERNA SHAKE) Liqd Take 237 mLs by mouth 2 (two) times daily between meals. Between breakfast and lunch   NUTRITIONAL SUPPLEMENT Liqd Initiate 4 oz of NSA medpass by mouth 4 times daily   feeding supplement  (PRO-STAT SUGAR FREE 64) Liqd Take 30 mLs by mouth 2 (two) times daily. Wound healing   ipratropium-albuterol 0.5-2.5 (3) MG/3ML Soln Commonly known as:  DUONEB Take 3 mLs by nebulization every 6 (six) hours as needed. Sob, congestion/wheezing   meclizine 25 MG tablet Commonly known as:  ANTIVERT Take 25 mg by mouth 3 (three) times daily. For dizziness   metFORMIN 1000 MG tablet Commonly known as:  GLUCOPHAGE Take 1,000 mg by mouth 2 (two) times daily with a meal.   mirtazapine 15 MG tablet Commonly known as:  REMERON Take 7.5 mg by mouth at bedtime.   olopatadine 0.1 % ophthalmic solution Commonly known as:  PATANOL Place 1 drop into both eyes daily.   ondansetron 4 MG tablet Commonly known as:  ZOFRAN Take 4 mg by mouth every 6 (six) hours as needed for nausea or vomiting.   pantoprazole 40 MG tablet Commonly known as:  PROTONIX Take 40 mg by mouth daily. For reflux   sertraline 100 MG tablet Commonly known as:  ZOLOFT Take 100 mg by mouth daily.   tamsulosin 0.4 MG Caps capsule Commonly known as:  FLOMAX Take 0.4 mg by mouth at bedtime.   TOUJEO SOLOSTAR 300 UNIT/ML Sopn Generic drug:  Insulin Glargine Inject  10 Units into the skin at bedtime.   triamcinolone ointment 0.1 % Commonly known as:  KENALOG Apply 1 application topically daily as needed. Right lower leg       Meds ordered this encounter  Medications  . Amino Acids-Protein Hydrolys (FEEDING SUPPLEMENT, PRO-STAT SUGAR FREE 64,) LIQD    Sig: Take 30 mLs by mouth 2 (two) times daily. Wound healing    Immunization History  Administered Date(s) Administered  . PPD Test 05/12/2009    Social History  Substance Use Topics  . Smoking status: Never Smoker  . Smokeless tobacco: Not on file  . Alcohol use Not on file    Review of Systems  DATA OBTAINED: from patient, nurse GENERAL:  no fevers, fatigue, appetite changes SKIN: wound R 4th and 5th toe HEENT: No complaint RESPIRATORY: No cough,  wheezing, SOB CARDIAC: No chest pain, palpitations, lower extremity edema  GI: No abdominal pain, No N/V/D or constipation, No heartburn or reflux  GU: No dysuria, frequency or urgency, or incontinence  MUSCULOSKELETAL: No unrelieved bone/joint pain NEUROLOGIC: No headache, dizziness  PSYCHIATRIC: No overt anxiety or sadness  Vitals:   03/23/16 1343  BP: 104/62  Pulse: 93  Resp: 20  Temp: 98.6 F (37 C)    Physical Exam  GENERAL APPEARANCE: Alert, conversant, No acute distress ; very frail SKIN: dressing R toes 4-5 HEENT: Unremarkable RESPIRATORY: Breathing is even, unlabored. Lung sounds are clear   CARDIOVASCULAR: Heart RRR no murmurs, rubs or gallops. No peripheral edema  GASTROINTESTINAL: Abdomen is soft, non-tender, not distended w/ normal bowel sounds.  GENITOURINARY: Bladder non tender, not distended  MUSCULOSKELETAL: wasting L AKA NEUROLOGIC: Cranial nerves 2-12 grossly intact; L side weakness PSYCHIATRIC: Mood and affect appropriate to situation, no behavioral issues  Patient Active Problem List   Diagnosis Date Noted  . Acute osteomyelitis of toe of right foot ([place]) 03/25/2016  . Leukocytosis 01/02/2016  . Anxiety state 07/02/2015  . Prostate cancer ([place]) 10/30/2014  . Fever presenting with conditions classified elsewhere 10/04/2014  . Acute urinary retention 09/16/2014  . Taking medication for chronic disease 09/07/2014  . Leukocytosis, unspecified 03/03/2014  . Peripheral vascular disease ([place]) 02/25/2014  . Conjunctivitis 02/25/2014  . Itching 10/23/2013  . Depression 04/17/2013  . Pruritic disorder 03/11/2013  . Urinary tract infection, site not specified 02/27/2013  . Pure hypercholesterolemia 11/29/2012  . 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 Hyperlipidemia LDL goal <70 11/11/2012  . Loss of weight 11/11/2012  .  Essential hypertension, benign 11/11/2012  . Unspecified constipation 11/11/2012  . Type 2 diabetes with complication ([place]) 99991111  . Benign paroxysmal positional vertigo 11/11/2012  . CVA (cerebral vascular accident) ([place]) 11/11/2012  . Insomnia 11/11/2012  . GERD (gastroesophageal reflux disease) 11/11/2012    CBC    Component Value Date/Time   WBC 10.7 01/03/2016   HGB 11.9 (A) 01/03/2016   HCT 36 (A) 01/03/2016   PLT 176 01/03/2016    CMP     Component Value Date/Time   NA 136 (A) 01/03/2016   K 4.0 01/03/2016   BUN 15 01/03/2016   CREATININE 0.7 01/03/2016   AST 19 12/30/2015   ALT 11 12/30/2015   ALKPHOS 88 12/30/2015    Assessment and Plan  Acute osteomyelitis of toe of right foot ([place]) Pt has a wound cx come back several days ago, there were few bacteria, MRSA, we would wait  for bone bx. Bone biospy came from 4th toe;5th toe has no bony feel, it is mushy so certainly osteo in that toe as well. Pt has had a L AKA, so R BKA may be an option as well if he is considered a surgical candidate; pt never leaves his bed.  I spoke to him about this option and asked him to think about; I will speak to his wife; in the meantime vancomycin has been ordered for 6 weeks.   Time spent > 25 min;> 50% of time with patient was spent reviewing records, labs, tests and studies, counseling and developing plan of care  Noah Delaine. Sheppard Coil, MD

## 2016-03-25 ENCOUNTER — Encounter: Payer: Self-pay | Admitting: Internal Medicine

## 2016-03-25 DIAGNOSIS — M869 Osteomyelitis, unspecified: Secondary | ICD-10-CM | POA: Insufficient documentation

## 2016-03-25 DIAGNOSIS — M86171 Other acute osteomyelitis, right ankle and foot: Secondary | ICD-10-CM | POA: Insufficient documentation

## 2016-03-25 NOTE — Assessment & Plan Note (Addendum)
Pt has a wound cx come back several days ago, there were few bacteria, MRSA, we would wait for bone bx. Bone biospy came from 4th toe;5th toe has no bony feel, it is mushy so certainly osteo in that toe as well. Pt has had a L AKA, so R BKA may be an option as well if he is considered a surgical candidate; pt never leaves his bed.  I spoke to him about this option and asked him to think about; I will speak to his wife; in the meantime vancomycin has been ordered for 6 weeks.

## 2016-03-28 ENCOUNTER — Encounter: Payer: Self-pay | Admitting: Internal Medicine

## 2016-03-28 ENCOUNTER — Non-Acute Institutional Stay (SKILLED_NURSING_FACILITY): Payer: Medicare Other | Admitting: Internal Medicine

## 2016-03-28 DIAGNOSIS — M86171 Other acute osteomyelitis, right ankle and foot: Secondary | ICD-10-CM | POA: Diagnosis not present

## 2016-03-28 NOTE — Progress Notes (Signed)
MRN: [id number] Name: Jeremiah Baldwin  Sex: male Age: 70 y.o. DOB: [birth year]  [facility] #:  Facility/Room:[facility] / [address] Level Of Care: SNF Provider: Noah Delaine. Sheppard Coil, MD Emergency Contacts: Extended Emergency Contact Information Primary Emergency Contact: Jerrell,CAROLYN Address: [place],  [phone] Home Phone: [phone] Relation: None  Code Status: Full Code  Allergies: Codeine  Chief Complaint  Patient presents with  . Acute Visit    Acute    HPI: Patient is 70 y.o. male with PVD, s/p l AKA who was recently dx with osteo R 4th toe, 5th will be involved as well.I was able to speak to pt's wife today regarding whether she would consider an amputation as a possible solution, as opposed to 6 weeks vancomycin.  Past Medical History:  Diagnosis Date  . Allergy   . Anorexia   . CAD (coronary artery disease)   . Colon polyp   . Diabetes mellitus without complication ([place])   . Diabetic retinopathy ([place])   . GERD (gastroesophageal reflux disease)   . Hyperlipidemia   . Hypertension   . Left hemiparesis ([place])   . PVD (peripheral vascular disease) ([place])   . Stroke [facility])     Past Surgical History:  Procedure Laterality Date  . enteroscopic laser photocoagulation Left 2001  . left bka        Medication List       Accurate as of 03/28/16 11:59 PM. Always use your most recent med list.          buPROPion 150 MG 12 hr tablet Commonly known as:  WELLBUTRIN SR Take 150 mg by mouth daily. For depression   clopidogrel 75 MG tablet Commonly known as:  PLAVIX Take 75 mg by mouth daily.   docusate sodium 100 MG capsule Commonly known as:  COLACE Take 200 mg by mouth 3 (three) times daily.   enalapril 5 MG tablet Commonly known as:  VASOTEC Take 5 mg by mouth daily.   feeding supplement (GLUCERNA SHAKE) Liqd Take 237 mLs by mouth 2 (two) times daily between meals. Between breakfast and lunch   NUTRITIONAL SUPPLEMENT Liqd Initiate 4  oz of NSA medpass by mouth 4 times daily   feeding supplement (PRO-STAT SUGAR FREE 64) Liqd Take 30 mLs by mouth 2 (two) times daily. Wound healing   ipratropium-albuterol 0.5-2.5 (3) MG/3ML Soln Commonly known as:  DUONEB Take 3 mLs by nebulization every 6 (six) hours as needed. Sob, congestion/wheezing   meclizine 25 MG tablet Commonly known as:  ANTIVERT Take 25 mg by mouth 3 (three) times daily. For dizziness   metFORMIN 1000 MG tablet Commonly known as:  GLUCOPHAGE Take 1,000 mg by mouth 2 (two) times daily with a meal.   mirtazapine 15 MG tablet Commonly known as:  REMERON Take 7.5 mg by mouth at bedtime.   olopatadine 0.1 % ophthalmic solution Commonly known as:  PATANOL Place 1 drop into both eyes daily.   ondansetron 4 MG tablet Commonly known as:  ZOFRAN Take 4 mg by mouth every 6 (six) hours as needed for nausea or vomiting.   pantoprazole 40 MG tablet Commonly known as:  PROTONIX Take 40 mg by mouth daily. For reflux   sertraline 100 MG tablet Commonly known as:  ZOLOFT Take 100 mg by mouth daily.   tamsulosin 0.4 MG Caps capsule Commonly known as:  FLOMAX Take 0.4 mg by mouth at bedtime.   TOUJEO SOLOSTAR  300 UNIT/ML Sopn Generic drug:  Insulin Glargine Inject 10 Units into the skin at bedtime.   triamcinolone ointment 0.1 % Commonly known as:  KENALOG Apply 1 application topically daily as needed. Right lower leg       No orders of the defined types were placed in this encounter.   Immunization History  Administered Date(s) Administered  . PPD Test 05/12/2009    Social History  Substance Use Topics  . Smoking status: Never Smoker  . Smokeless tobacco: Not on file  . Alcohol use Not on file    Review of Systems  DATA OBTAINED: from patient, nurse; no c/o - never has c/o GENERAL:  no fevers, fatigue, appetite changes SKIN: No itching, rash HEENT: No complaint RESPIRATORY: No cough, wheezing, SOB CARDIAC: No chest pain,  palpitations, lower extremity edema  GI: No abdominal pain, No N/V/D or constipation, No heartburn or reflux  GU: No dysuria, frequency or urgency, or incontinence  MUSCULOSKELETAL: No unrelieved bone/joint pain NEUROLOGIC: No headache, dizziness  PSYCHIATRIC: No overt anxiety or sadness  Vitals:   03/28/16 1210  BP: 106/71  Pulse: 90  Resp: 16  Temp: 97 F (36.1 C)    Physical Exam  GENERAL APPEARANCE: Alert, modconversant, No acute distress  SKIN: No diaphoresis rash; dressing R 4-5 toes HEENT: Unremarkable RESPIRATORY: Breathing is even, unlabored. Lung sounds are clear   CARDIOVASCULAR: Heart RRR no murmurs, rubs or gallops. No peripheral edema  GASTROINTESTINAL: Abdomen is soft, non-tender, not distended w/ normal bowel sounds.  GENITOURINARY: Bladder non tender, not distended  MUSCULOSKELETAL: L side wasting;L AKA NEUROLOGIC: Cranial nerves 2-12 grossly intact; L side weakness PSYCHIATRIC: Mood and affect appropriate to situation, no behavioral issues  Patient Active Problem List   Diagnosis Date Noted  . Acute osteomyelitis of toe of right foot ([place]) 03/25/2016  . Leukocytosis 01/02/2016  . Anxiety state 07/02/2015  . Prostate cancer ([place]) 10/30/2014  . Fever presenting with conditions classified elsewhere 10/04/2014  . Acute urinary retention 09/16/2014  . Taking medication for chronic disease 09/07/2014  . Leukocytosis, unspecified 03/03/2014  . Peripheral vascular disease ([place]) 02/25/2014  . Conjunctivitis 02/25/2014  . Itching 10/23/2013  . Depression 04/17/2013  . Pruritic disorder 03/11/2013  . Urinary tract infection, site not specified 02/27/2013  . Pure hypercholesterolemia 11/29/2012  . 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 Hyperlipidemia LDL goal <70 11/11/2012  . Loss of weight 11/11/2012  . Essential  hypertension, benign 11/11/2012  . Unspecified constipation 11/11/2012  . Type 2 diabetes with complication ([place]) 99991111  . Benign paroxysmal positional vertigo 11/11/2012  . CVA (cerebral vascular accident) ([place]) 11/11/2012  . Insomnia 11/11/2012  . GERD (gastroesophageal reflux disease) 11/11/2012    CBC    Component Value Date/Time   WBC 10.7 01/03/2016   HGB 11.9 (A) 01/03/2016   HCT 36 (A) 01/03/2016   PLT 176 01/03/2016    CMP     Component Value Date/Time   NA 136 (A) 01/03/2016   K 4.0 01/03/2016   BUN 15 01/03/2016   CREATININE 0.7 01/03/2016   AST 19 12/30/2015   ALT 11 12/30/2015   ALKPHOS 88 12/30/2015    Assessment and Plan  Acute osteomyelitis of toe of right foot ([place]) I was able to get pt's wife Hoyle Sauer per phone today. I explained entire situation with her and discussed whether she would be interested in her husband  being considered for an amputation- I think that may be an easier solution, given he never gets out of bed. She said that would be an option. The downside is he may not be a candidate given his poor health. She said she thought his l AKA was done in [place], but I can't find any records in care everywhere about this-she said it occurred around 3 years ago. Will make referral to [facility].  Time spent > 25 min;> 50% of time with patient was spent reviewing records, labs, tests and studies, counseling and developing plan of care  Noah Delaine. Sheppard Coil, MD

## 2016-03-29 ENCOUNTER — Encounter: Payer: Self-pay | Admitting: Internal Medicine

## 2016-03-29 NOTE — Assessment & Plan Note (Signed)
I was able to get pt's wife Hoyle Sauer per phone today. I explained entire situation with her and discussed whether she would be interested in her husband being considered for an amputation- I think that may be an easier solution, given he never gets out of bed. She said that would be an option. The downside is he may not be a candidate given his poor health. She said she thought his l AKA was done in [place], but I can't find any records in care everywhere about this-she said it occurred around 3 years ago. Will make referral to [place] [facility].

## 2016-03-30 ENCOUNTER — Encounter: Payer: Self-pay | Admitting: Internal Medicine

## 2016-03-30 ENCOUNTER — Non-Acute Institutional Stay (SKILLED_NURSING_FACILITY): Payer: Medicare Other | Admitting: Internal Medicine

## 2016-03-30 DIAGNOSIS — L089 Local infection of the skin and subcutaneous tissue, unspecified: Secondary | ICD-10-CM

## 2016-03-30 DIAGNOSIS — L729 Follicular cyst of the skin and subcutaneous tissue, unspecified: Secondary | ICD-10-CM

## 2016-03-30 NOTE — Progress Notes (Signed)
MRN: [id number] Name: Jeremiah Baldwin  Sex: male Age: 70 y.o. DOB: 1946/04/11  [place] #:  Facility/Room: [facility] / [address] Level Of Care: SNF Provider: Noah Delaine. Sheppard Coil, MD Emergency Contacts: Extended Emergency Contact Information Primary Emergency Contact: Lowdermilk,CAROLYN Address: [place],  [phone] Home Phone: [phone] Relation: None  Code Status: Full Code  Allergies: Codeine  Chief Complaint  Patient presents with  . Acute Visit    Acute    HPI: Patient is 70 y.o. male who is being treated with IV vancomycin  For R 4-5th toe osteomyelitis who  nursing asked me to see for boils under his L arm just noted today. Pt admits they are a little tender. Cannot say whether he has had them before.  Past Medical History:  Diagnosis Date  . Allergy   . Anorexia   . CAD (coronary artery disease)   . Colon polyp   . Diabetes mellitus without complication ([place])   . Diabetic retinopathy ([place])   . GERD (gastroesophageal reflux disease)   . Hyperlipidemia   . Hypertension   . Left hemiparesis ([place])   . PVD (peripheral vascular disease) ([place])   . Stroke [facility])     Past Surgical History:  Procedure Laterality Date  . enteroscopic laser photocoagulation Left 2001  . left bka        Medication List       Accurate as of 03/30/16 11:59 PM. Always use your most recent med list.          buPROPion 150 MG 12 hr tablet Commonly known as:  WELLBUTRIN SR Take 150 mg by mouth daily. For depression   clopidogrel 75 MG tablet Commonly known as:  PLAVIX Take 75 mg by mouth daily.   docusate sodium 100 MG capsule Commonly known as:  COLACE Take 200 mg by mouth 3 (three) times daily.   enalapril 5 MG tablet Commonly known as:  VASOTEC Take 5 mg by mouth daily.   feeding supplement (GLUCERNA SHAKE) Liqd Take 237 mLs by mouth 2 (two) times daily between meals. Between breakfast and lunch   NUTRITIONAL SUPPLEMENT Liqd Initiate 4 oz of NSA  medpass by mouth 4 times daily   feeding supplement (PRO-STAT SUGAR FREE 64) Liqd Take 30 mLs by mouth 2 (two) times daily. Wound healing   ipratropium-albuterol 0.5-2.5 (3) MG/3ML Soln Commonly known as:  DUONEB Take 3 mLs by nebulization every 6 (six) hours as needed. Sob, congestion/wheezing   meclizine 25 MG tablet Commonly known as:  ANTIVERT Take 25 mg by mouth 3 (three) times daily. For dizziness   metFORMIN 1000 MG tablet Commonly known as:  GLUCOPHAGE Take 1,000 mg by mouth 2 (two) times daily with a meal.   mirtazapine 15 MG tablet Commonly known as:  REMERON Take 7.5 mg by mouth at bedtime.   olopatadine 0.1 % ophthalmic solution Commonly known as:  PATANOL Place 1 drop into both eyes daily.   ondansetron 4 MG tablet Commonly known as:  ZOFRAN Take 4 mg by mouth every 6 (six) hours as needed for nausea or vomiting.   pantoprazole 40 MG tablet Commonly known as:  PROTONIX Take 40 mg by mouth daily. For reflux   sertraline 100 MG tablet Commonly known as:  ZOLOFT Take 100 mg by mouth daily.   tamsulosin 0.4 MG Caps capsule Commonly known as:  FLOMAX Take 0.4 mg by mouth at bedtime.   TOUJEO SOLOSTAR  300 UNIT/ML Sopn Generic drug:  Insulin Glargine Inject 10 Units into the skin at bedtime.   triamcinolone ointment 0.1 % Commonly known as:  KENALOG Apply 1 application topically daily as needed. Right lower leg       No orders of the defined types were placed in this encounter.   Immunization History  Administered Date(s) Administered  . PPD Test 05/12/2009    Social History  Substance Use Topics  . Smoking status: Never Smoker  . Smokeless tobacco: Not on file  . Alcohol use Not on file    Review of Systems  DATA OBTAINED: from patient, nurse GENERAL:  no fevers, fatigue, appetite changes SKIN: boils under arm HEENT: No complaint RESPIRATORY: No cough, wheezing, SOB CARDIAC: No chest pain, palpitations, lower extremity edema  GI: No  abdominal pain, No N/V/D or constipation, No heartburn or reflux  GU: No dysuria, frequency or urgency, or incontinence  MUSCULOSKELETAL: No unrelieved bone/joint pain NEUROLOGIC: No headache, dizziness  PSYCHIATRIC: No overt anxiety or sadness  Vitals:   03/30/16 1047  BP: 106/71  Pulse: 90  Resp: 16  Temp: 97 F (36.1 C)    Physical Exam  GENERAL APPEARANCE: Alert, conversant, No acute distress  SKIN: # infected cysts R axilla; one is ripe and able to express pus from it; do not see any tracts; pt is already on vancomycin for osteomyelitis; R toe 4-5 dressed, no surrounding redness or heat HEENT: Unremarkable RESPIRATORY: Breathing is even, unlabored. Lung sounds are clear   CARDIOVASCULAR: Heart RRR no murmurs, rubs or gallops. No peripheral edema  GASTROINTESTINAL: Abdomen is soft, non-tender, not distended w/ normal bowel sounds.  GENITOURINARY: Bladder non tender, not distended  MUSCULOSKELETAL: L BKA NEUROLOGIC: Cranial nerves 2-12 grossly intact; L hemiplegia PSYCHIATRIC: Mood and affect appropriate to situation, no behavioral issues  Patient Active Problem List   Diagnosis Date Noted  . Acute osteomyelitis of toe of right foot ([place]) 03/25/2016  . Leukocytosis 01/02/2016  . Anxiety state 07/02/2015  . Prostate cancer ([place]) 10/30/2014  . Fever presenting with conditions classified elsewhere 10/04/2014  . Acute urinary retention 09/16/2014  . Taking medication for chronic disease 09/07/2014  . Leukocytosis, unspecified 03/03/2014  . Peripheral vascular disease ([place]) 02/25/2014  . Conjunctivitis 02/25/2014  . Itching 10/23/2013  . Depression 04/17/2013  . Pruritic disorder 03/11/2013  . Urinary tract infection, site not specified 02/27/2013  . Pure hypercholesterolemia 11/29/2012  . 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  Hyperlipidemia LDL goal <70 11/11/2012  . Loss of weight 11/11/2012  . Essential hypertension, benign 11/11/2012  . Unspecified constipation 11/11/2012  . Type 2 diabetes with complication ([place]) 99991111  . Benign paroxysmal positional vertigo 11/11/2012  . CVA (cerebral vascular accident) ([place]) 11/11/2012  . Insomnia 11/11/2012  . GERD (gastroesophageal reflux disease) 11/11/2012    CBC    Component Value Date/Time   WBC 10.7 01/03/2016   HGB 11.9 (A) 01/03/2016   HCT 36 (A) 01/03/2016   PLT 176 01/03/2016    CMP     Component Value Date/Time   NA 136 (A) 01/03/2016   K 4.0 01/03/2016   BUN 15 01/03/2016   CREATININE 0.7 01/03/2016   AST 19 12/30/2015   ALT 11 12/30/2015   ALKPHOS 88 12/30/2015    Assessment and Plan  INFECTED CYSTS R AXILLA - pt is already on IV vancomycin; apply warm compresses TID;if  don't rupture spontaneously will need to I and D   Time spent > 25 min;> 50% of time with patient was spent reviewing records, labs, tests and studies, counseling and developing plan of care  Webb Silversmith D. Sheppard Coil, MD

## 2016-03-31 ENCOUNTER — Encounter: Payer: Self-pay | Admitting: Internal Medicine

## 2016-03-31 ENCOUNTER — Non-Acute Institutional Stay (SKILLED_NURSING_FACILITY): Payer: Medicare Other | Admitting: Internal Medicine

## 2016-03-31 DIAGNOSIS — F329 Major depressive disorder, single episode, unspecified: Secondary | ICD-10-CM

## 2016-03-31 DIAGNOSIS — C61 Malignant neoplasm of prostate: Secondary | ICD-10-CM

## 2016-03-31 DIAGNOSIS — I10 Essential (primary) hypertension: Secondary | ICD-10-CM | POA: Diagnosis not present

## 2016-03-31 DIAGNOSIS — F32A Depression, unspecified: Secondary | ICD-10-CM

## 2016-03-31 NOTE — Progress Notes (Signed)
MRN: [id number] Name: Jeremiah Baldwin  Sex: male Age: 70 y.o. DOB: 1945-08-16  [place] #:  Facility/Room: [facility] / [address] Level Of Care: SNF Provider: Noah Delaine. Sheppard Coil, MD Emergency Contacts: Extended Emergency Contact Information Primary Emergency Contact: Zilberman,CAROLYN Address: [place],  [phone] Home Phone: [phone] Relation: None  Code Status: Full Code   Allergies: Codeine  Chief Complaint  Patient presents with  . Medical Management of Chronic Issues    Routine Visit    HPI: Patient is 70 y.o. male who has recently been dx with osteomyelitis who is being seen for routine issues of prostate CA, depression and HTN.  Past Medical History:  Diagnosis Date  . Allergy   . Anorexia   . CAD (coronary artery disease)   . Colon polyp   . Diabetes mellitus without complication ([place])   . Diabetic retinopathy ([place])   . GERD (gastroesophageal reflux disease)   . Hyperlipidemia   . Hypertension   . Left hemiparesis ([place])   . PVD (peripheral vascular disease) ([place])   . Stroke [facility])     Past Surgical History:  Procedure Laterality Date  . enteroscopic laser photocoagulation Left 2001  . left bka        Medication List       Accurate as of 03/31/16 11:59 PM. Always use your most recent med list.          buPROPion 150 MG 12 hr tablet Commonly known as:  WELLBUTRIN SR Take 150 mg by mouth daily. For depression   clopidogrel 75 MG tablet Commonly known as:  PLAVIX Take 75 mg by mouth daily.   docusate sodium 100 MG capsule Commonly known as:  COLACE Take 200 mg by mouth 3 (three) times daily.   enalapril 5 MG tablet Commonly known as:  VASOTEC Take 5 mg by mouth daily.   feeding supplement (GLUCERNA SHAKE) Liqd Take 237 mLs by mouth 2 (two) times daily between meals. Between breakfast and lunch   NUTRITIONAL SUPPLEMENT Liqd Initiate 4 oz of NSA medpass by mouth 4 times daily   feeding supplement (PRO-STAT SUGAR FREE 64)  Liqd Take 30 mLs by mouth 2 (two) times daily. Wound healing   ipratropium-albuterol 0.5-2.5 (3) MG/3ML Soln Commonly known as:  DUONEB Take 3 mLs by nebulization every 6 (six) hours as needed. Sob, congestion/wheezing   meclizine 25 MG tablet Commonly known as:  ANTIVERT Take 25 mg by mouth 3 (three) times daily. For dizziness   metFORMIN 1000 MG tablet Commonly known as:  GLUCOPHAGE Take 1,000 mg by mouth 2 (two) times daily with a meal.   mirtazapine 15 MG tablet Commonly known as:  REMERON Take 7.5 mg by mouth at bedtime.   olopatadine 0.1 % ophthalmic solution Commonly known as:  PATANOL Place 1 drop into both eyes daily.   ondansetron 4 MG tablet Commonly known as:  ZOFRAN Take 4 mg by mouth every 6 (six) hours as needed for nausea or vomiting.   pantoprazole 40 MG tablet Commonly known as:  PROTONIX Take 40 mg by mouth daily. For reflux   sertraline 100 MG tablet Commonly known as:  ZOLOFT Take 100 mg by mouth daily.   tamsulosin 0.4 MG Caps capsule Commonly known as:  FLOMAX Take 0.4 mg by mouth at bedtime.   TOUJEO SOLOSTAR 300 UNIT/ML Sopn Generic drug:  Insulin Glargine Inject 10 Units into the skin at bedtime.   triamcinolone ointment  0.1 % Commonly known as:  KENALOG Apply 1 application topically daily as needed. Right lower leg       No orders of the defined types were placed in this encounter.   Immunization History  Administered Date(s) Administered  . PPD Test 05/12/2009    Social History  Substance Use Topics  . Smoking status: Never Smoker  . Smokeless tobacco: Not on file  . Alcohol use Not on file    Review of Systems  DATA OBTAINED: from patient, nurse, medical record, family member GENERAL:  no fevers, fatigue, appetite changes SKIN: No itching, rash HEENT: No complaint RESPIRATORY: No cough, wheezing, SOB CARDIAC: No chest pain, palpitations, lower extremity edema  GI: No abdominal pain, No N/V/D or constipation, No  heartburn or reflux  GU: No dysuria, frequency or urgency, or incontinence  MUSCULOSKELETAL: No unrelieved bone/joint pain NEUROLOGIC: No headache, dizziness  PSYCHIATRIC: No overt anxiety or sadness  Vitals:   03/31/16 1131  BP: 100/61  Pulse: 69  Resp: 16  Temp: 97.9 F (36.6 C)    Physical Exam  GENERAL APPEARANCE: Alert, conversant, No acute distress  SKIN: No diaphoresis rash HEENT: Unremarkable RESPIRATORY: Breathing is even, unlabored. Lung sounds are clear   CARDIOVASCULAR: Heart RRR no murmurs, rubs or gallops. No peripheral edema  GASTROINTESTINAL: Abdomen is soft, non-tender, not distended w/ normal bowel sounds.  GENITOURINARY: Bladder non tender, not distended  MUSCULOSKELETAL: No abnormal joints or musculature NEUROLOGIC: Cranial nerves 2-12 grossly intact. Moves all extremities PSYCHIATRIC: Mood and affect appropriate to situation, no behavioral issues  Patient Active Problem List   Diagnosis Date Noted  . Acute osteomyelitis of toe of right foot ([place]) 03/25/2016  . Leukocytosis 01/02/2016  . Anxiety state 07/02/2015  . Prostate cancer ([place]) 10/30/2014  . Fever presenting with conditions classified elsewhere 10/04/2014  . Acute urinary retention 09/16/2014  . Taking medication for chronic disease 09/07/2014  . Leukocytosis, unspecified 03/03/2014  . Peripheral vascular disease ([place]) 02/25/2014  . Conjunctivitis 02/25/2014  . Itching 10/23/2013  . Depression 04/17/2013  . Pruritic disorder 03/11/2013  . Urinary tract infection, site not specified 02/27/2013  . Pure hypercholesterolemia 11/29/2012  . 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 Hyperlipidemia LDL goal <70 11/11/2012  . Loss of weight 11/11/2012  . Essential hypertension, benign 11/11/2012  . Unspecified constipation 11/11/2012  . Type 2 diabetes with  complication ([place]) 99991111  . Benign paroxysmal positional vertigo 11/11/2012  . CVA (cerebral vascular accident) ([place]) 11/11/2012  . Insomnia 11/11/2012  . GERD (gastroesophageal reflux disease) 11/11/2012    CBC    Component Value Date/Time   WBC 10.7 01/03/2016   HGB 11.9 (A) 01/03/2016   HCT 36 (A) 01/03/2016   PLT 176 01/03/2016    CMP     Component Value Date/Time   NA 136 (A) 01/03/2016   K 4.0 01/03/2016   BUN 15 01/03/2016   CREATININE 0.7 01/03/2016   AST 19 12/30/2015   ALT 11 12/30/2015   ALKPHOS 88 12/30/2015    Assessment and Plan  Prostate cancer Pt has osteomyelitis of 2 toes but no symptoms referable to prostate CA,; will cont to monitor  Depression Pt's mood is stable; he appears content but not particularly happy;plan to cont zoloft 100 mg daily, and wellbutrin 150 mg daily  Essential hypertension, benign Controlled;plan t cont vasotec 5 mg daily   Twyla Dais D. Sheppard Coil, MD

## 2016-04-02 ENCOUNTER — Encounter: Payer: Self-pay | Admitting: Internal Medicine

## 2016-04-02 NOTE — Assessment & Plan Note (Signed)
Controlled;plan t cont vasotec 5 mg daily

## 2016-04-02 NOTE — Assessment & Plan Note (Signed)
Pt has osteomyelitis of 2 toes but no symptoms referable to prostate CA,; will cont to monitor

## 2016-04-02 NOTE — Assessment & Plan Note (Signed)
Pt's mood is stable; he appears content but not particularly happy;plan to cont zoloft 100 mg daily, and wellbutrin 150 mg daily

## 2016-04-10 LAB — BASIC METABOLIC PANEL: Creatinine: 0.6 mg/dL (ref 0.6–1.3)

## 2016-04-14 ENCOUNTER — Non-Acute Institutional Stay (SKILLED_NURSING_FACILITY): Payer: Medicare Other | Admitting: Internal Medicine

## 2016-04-14 ENCOUNTER — Encounter: Payer: Self-pay | Admitting: Internal Medicine

## 2016-04-14 DIAGNOSIS — Z043 Encounter for examination and observation following other accident: Secondary | ICD-10-CM | POA: Diagnosis not present

## 2016-04-14 DIAGNOSIS — S3992XA Unspecified injury of lower back, initial encounter: Secondary | ICD-10-CM

## 2016-04-14 DIAGNOSIS — W19XXXA Unspecified fall, initial encounter: Secondary | ICD-10-CM

## 2016-04-14 DIAGNOSIS — Z041 Encounter for examination and observation following transport accident: Secondary | ICD-10-CM
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
## 2016-04-15 ENCOUNTER — Encounter: Payer: Self-pay | Admitting: Internal Medicine

## 2016-04-27 LAB — BASIC METABOLIC PANEL
BUN: 10 mg/dL (ref 4–21)
CREATININE: 0.7 mg/dL (ref 0.6–1.3)
GLUCOSE: 137 mg/dL
POTASSIUM: 4.5 mmol/L (ref 3.4–5.3)
SODIUM: 139 mmol/L (ref 137–147)

## 2016-05-01 ENCOUNTER — Encounter: Payer: Self-pay | Admitting: Internal Medicine

## 2016-05-01 ENCOUNTER — Non-Acute Institutional Stay (SKILLED_NURSING_FACILITY): Payer: Medicare Other | Admitting: Internal Medicine

## 2016-05-01 DIAGNOSIS — I2510 Atherosclerotic heart disease of native coronary artery without angina pectoris: Secondary | ICD-10-CM | POA: Insufficient documentation

## 2016-05-01 DIAGNOSIS — N3000 Acute cystitis without hematuria: Secondary | ICD-10-CM | POA: Diagnosis not present

## 2016-05-01 DIAGNOSIS — R338 Other retention of urine: Secondary | ICD-10-CM

## 2016-05-01 DIAGNOSIS — E1051 Type 1 diabetes mellitus with diabetic peripheral angiopathy without gangrene: Secondary | ICD-10-CM | POA: Diagnosis not present

## 2016-05-01 DIAGNOSIS — F329 Major depressive disorder, single episode, unspecified: Secondary | ICD-10-CM | POA: Diagnosis not present

## 2016-05-01 DIAGNOSIS — R6520 Severe sepsis without septic shock: Secondary | ICD-10-CM | POA: Diagnosis not present

## 2016-05-01 DIAGNOSIS — M86171 Other acute osteomyelitis, right ankle and foot: Secondary | ICD-10-CM

## 2016-05-01 DIAGNOSIS — F32A Depression, unspecified: Secondary | ICD-10-CM

## 2016-05-01 DIAGNOSIS — N179 Acute kidney failure, unspecified: Secondary | ICD-10-CM | POA: Diagnosis not present

## 2016-05-01 DIAGNOSIS — A419 Sepsis, unspecified organism: Secondary | ICD-10-CM | POA: Diagnosis not present

## 2016-05-01 NOTE — Progress Notes (Addendum)
: Provider:  Inocencio Homes MD Location:  [place] of Service:  SNF (31)  PCP: No primary care provider on file. No care team member to display  Extended Emergency Contact Information Primary Emergency Contact: Futch,CAROLYN Address: [place],  [phone] Home Phone: [phone] Relation: None     Allergies: Codeine  Chief Complaint  Patient presents with  . Readmit To SNF    HPI: Patient is 70 y.o. male who was admitted to [facility] from 10/1-6 for sepsis from a UTI. Pt was already on vancomycin for osteo and zosyn was added initially then changed to rocephin per ID Hospital course was complicated by AKI and acute urinary retention which resolved Pt is admitted back to SNF for residential care. While at [facility] pt will be followed for osteo of foot dx and tx begun at SNF with vancomycin IV, DM, tx with metformin and toujeo, HTN, tc with vasotec and depression, tx with wellbutrin.  Past Medical History:  Diagnosis Date  . Allergy   . Anorexia   . CAD (coronary artery disease)   . Colon polyp   . Diabetes mellitus without complication ([place])   . Diabetic retinopathy ([place])   . GERD (gastroesophageal reflux disease)   . Hyperlipidemia   . Hypertension   . Left hemiparesis ([place])   . PVD (peripheral vascular disease) ([place])   . Stroke [facility])     Past Surgical History:  Procedure Laterality Date  . enteroscopic laser photocoagulation Left 2001  . left bka        Medication List       Accurate as of 05/01/16 11:59 PM. Always use your most recent med list.          buPROPion 150 MG 12 hr tablet Commonly known as:  WELLBUTRIN SR Take 150 mg by mouth daily. For depression   CEFTRIAXONE SODIUM IV 2 g in sodium chloride 0.9% 100 ml IVPM for 4 days   ciprofloxacin 500 MG tablet Commonly known as:  CIPRO Take 500 mg by mouth 2 (two) times daily. Start taking on:  05/03/2016   clopidogrel 75 MG tablet Commonly known  as:  PLAVIX Take 75 mg by mouth daily.   docusate sodium 100 MG capsule Commonly known as:  COLACE Take 200 mg by mouth 3 (three) times daily.   enalapril 5 MG tablet Commonly known as:  VASOTEC Take 5 mg by mouth daily.   feeding supplement (GLUCERNA SHAKE) Liqd Take 237 mLs by mouth 2 (two) times daily between meals. Between breakfast and lunch   feeding supplement (PRO-STAT SUGAR FREE 64) Liqd Take 30 mLs by mouth 2 (two) times daily. Wound healing   ipratropium-albuterol 0.5-2.5 (3) MG/3ML Soln Commonly known as:  DUONEB Take 3 mLs by nebulization every 6 (six) hours as needed. Sob, congestion/wheezing   meclizine 25 MG tablet Commonly known as:  ANTIVERT Take 25 mg by mouth 3 (three) times daily. For dizziness   metFORMIN 1000 MG tablet Commonly known as:  GLUCOPHAGE Take 1,000 mg by mouth 2 (two) times daily with a meal.   mirtazapine 15 MG tablet Commonly known as:  REMERON Take 7.5 mg by mouth at bedtime.   olopatadine 0.1 % ophthalmic solution Commonly known as:  PATANOL Place 1 drop into both eyes daily.   ondansetron 4 MG tablet Commonly known as:  ZOFRAN Take 4 mg by mouth every 6 (six) hours  as needed for nausea or vomiting.   pantoprazole 40 MG tablet Commonly known as:  PROTONIX Take 40 mg by mouth daily. For reflux   sertraline 100 MG tablet Commonly known as:  ZOLOFT Take 100 mg by mouth daily.   tamsulosin 0.4 MG Caps capsule Commonly known as:  FLOMAX Take 0.4 mg by mouth at bedtime.   TOUJEO SOLOSTAR 300 UNIT/ML Sopn Generic drug:  Insulin Glargine Inject 10 Units into the skin at bedtime.       No orders of the defined types were placed in this encounter.   Immunization History  Administered Date(s) Administered  . PPD Test 05/12/2009    Social History  Substance Use Topics  . Smoking status: Never Smoker  . Smokeless tobacco: Not on file  . Alcohol use Not on file    Family history is + DM mother  Family History    Problem Relation Age of Onset  . Diabetes Mother       Review of Systems  DATA OBTAINED: from patient GENERAL:  no fevers, fatigue, appetite changes SKIN: No itching, or rash EYES: No eye pain, redness, discharge EARS: No earache, tinnitus, change in hearing NOSE: No congestion, drainage or bleed, nurseing  MOUTH/THROAT: No mouth or tooth pain, No sore throat RESPIRATORY: No cough, wheezing, SOB CARDIAC: No chest pain, palpitations, lower extremity edema  GI: No abdominal pain, No N/V/D or constipation, No heartburn or reflux  GU: No dysuria, frequency or urgency, or incontinence ; nurse reports he is urinating on his own OK MUSCULOSKELETAL: No unrelieved bone/joint pain NEUROLOGIC: No headache, dizziness or focal weakness PSYCHIATRIC: No c/o anxiety or sadness   Vitals:   05/01/16 1825  BP: 106/71  Pulse: 95  Resp: 17  Temp: 97.9 F (36.6 C)    SpO2 Readings from Last 1 Encounters:  No data found for SpO2   Body mass index is 16.36 kg/m.     Physical Exam  GENERAL APPEARANCE: Alert, minconversant,  No acute distress; very thin WM SKIN: No diaphoresis rash HEAD: Normocephalic, atraumatic  EYES: Conjunctiva/lids clear. Pupils round, reactive. EOMs intact.  EARS: External exam WNL, canals clear. Hearing grossly normal.  NOSE: No deformity or discharge.  MOUTH/THROAT: Lips w/o lesions  RESPIRATORY: Breathing is even, unlabored. Lung sounds are clear   CARDIOVASCULAR: Heart RRR no murmurs, rubs or gallops. No peripheral edema.   GASTROINTESTINAL: Abdomen is soft, non-tender, not distended w/ normal bowel sounds. GENITOURINARY: Bladder non tender, not distended  MUSCULOSKELETAL: No abnormal joints or musculature NEUROLOGIC:  Cranial nerves 2-12 grossly intact. Moves all extremities  PSYCHIATRIC: Mood and affect appropriate to situation, no behavioral issues  Patient Active Problem List   Diagnosis Date Noted  . Severe sepsis ([place]) 05/01/2016  . AKI (acute  kidney injury) ([place]) 05/01/2016  . CAD (coronary artery disease) 05/01/2016  . Acute osteomyelitis of toe of right foot ([place]) 03/25/2016  . Leukocytosis 01/02/2016  . Anxiety state 07/02/2015  . Prostate cancer ([place]) 10/30/2014  . Fever presenting with conditions classified elsewhere 10/04/2014  . Acute urinary retention 09/16/2014  . Taking medication for chronic disease 09/07/2014  . Leukocytosis, unspecified 03/03/2014  . Peripheral vascular disease ([place]) 02/25/2014  . Conjunctivitis 02/25/2014  . Itching 10/23/2013  . Depression 04/17/2013  . Pruritic disorder 03/11/2013  . Urinary tract infection 02/27/2013  . Pure hypercholesterolemia 11/29/2012  . 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  Hyperlipidemia LDL goal <70 11/11/2012  . Loss of weight 11/11/2012  . Essential hypertension, benign 11/11/2012  . Unspecified constipation 11/11/2012  . Type 2 diabetes with complication ([place]) 99991111  . Benign paroxysmal positional vertigo 11/11/2012  . CVA (cerebral vascular accident) ([place]) 11/11/2012  . Insomnia 11/11/2012  . GERD (gastroesophageal reflux disease) 11/11/2012      Labs reviewed: Basic Metabolic Panel:    Component Value Date/Time   NA 139 04/27/2016   K 4.5 04/27/2016   BUN 10 04/27/2016   CREATININE 0.7 04/27/2016   AST 19 12/30/2015   ALT 11 12/30/2015   ALKPHOS 88 12/30/2015     Recent Labs  12/30/15 01/03/16 04/27/16  NA 140 136* 139  K 4.1 4.0 4.5  BUN 21 15 10   CREATININE 0.7 0.7 0.7   Liver Function Tests:  Recent Labs  12/30/15  AST 19  ALT 11  ALKPHOS 88   No results for input(s): LIPASE, AMYLASE in the last 8760 hours. No results for input(s): AMMONIA in the last 8760 hours. CBC:  Recent Labs  10/13/15 12/30/15 01/03/16  WBC 10.2 11.5 10.7  HGB 11.9* 12.7* 11.9*  HCT 36* 38* 36*  PLT 175 198  176   Lipid  Recent Labs  09/29/15 12/30/15  CHOL 163 138  HDL 35 47  LDLCALC 104 76  TRIG 122 77    Cardiac Enzymes: No results for input(s): CKTOTAL, CKMB, CKMBINDEX, TROPONINI in the last 8760 hours. BNP: No results for input(s): BNP in the last 8760 hours. No results found for: [facility] Lab Results  Component Value Date   HGBA1C 5.3 12/30/2015   Lab Results  Component Value Date   TSH 1.67 12/30/2015   No results found for: VITAMINB12 No results found for: FOLATE No results found for: IRON, TIBC, FERRITIN  Imaging and Procedures obtained prior to SNF admission: Nm Bone Scan Whole Body  Result Date: 06/01/2015 CLINICAL DATA:  Prostate cancer EXAM: NUCLEAR MEDICINE WHOLE BODY BONE SCAN TECHNIQUE: Whole body anterior and posterior images were obtained approximately 3 hours after intravenous injection of radiopharmaceutical. RADIOPHARMACEUTICALS:  25.0 MCi Technetium-62m MDP IV COMPARISON:  10/08/2014 FINDINGS: Patient is status post left above the knee amputation. Increased activity in the cervical spine, shoulders and sternoclavicular joints compatible with degenerative changes. Multiple foci of increased activity noted within the ribs bilaterally. This was not definitively seen on prior study. Cannot exclude metastatic disease. IMPRESSION: Subtle scattered foci of increased activity within the ribs bilaterally. Cannot exclude bony metastatic disease. Degenerative uptake in the shoulders, sternoclavicular joints and cervical spine. Electronically Signed   By: Rolm Baptise M.D.   On: 06/01/2015 15:36     Not all labs, radiology exams or other studies done during hospitalization come through on my EPIC note; however they are reviewed by me.    Assessment and Plan  SEPSIS/UTI - pt was already on vanc for osteo and zosyn was added then changed to rocephin per ID. BC was neg, urine cx grew out > 100K gnr; pt is d/c on rocephin IV and cipro po. SNF ; cont IV  For 4 more days;  cipro 500 mg BID to start on 10/11 for 23 days  OSTEOMYELITIS R FOOT- pt was continued on vancomycin in hospital- new dosing on d/c; pt has already been seen for outpt AK[facility] SNF - new dosing-vanc 750 mg q 12 for 28 days  ACUTE URINARY RETENTION -  Foley was placed, problem cleared, foley was d/c SNF - cont flomax; pt  is urinating fine  AKI- resolved with holding enalopril and metformin and IVF SNF - will f/u BMP  DM2 SNF - controlled on metformin and toujeo10 u qHS  CAD SNF stable cont plavix  DEPRESSION SNF- stable; cont welbutrin 150 mg daily    Time spent . 45 min;> 50% of time with patient was spent reviewing records, labs, tests and studies, counseling and developing plan of care  Inocencio Homes, MD

## 2016-05-01 NOTE — Progress Notes (Signed)
This encounter was created in error - please disregard.

## 2016-05-01 NOTE — Progress Notes (Signed)
Patient ID: Jeremiah Baldwin, male   DOB: [birth year], 70 y.o.   MRN: [id number]  Provider:   Location:  [place] Room Number: [place] of Service:  SNF (31)  PCP: No primary care provider on file. No care team member to display  Extended Emergency Contact Information Primary Emergency Contact: Hakala,CAROLYN Address: [place],  [phone] Home Phone: [phone] Relation: None  Code Status: Goals of Care: Advanced Directive information Advanced Directives 04/14/2016  Does patient have an advance directive? No  Does patient want to make changes to advanced directive? -  Copy of advanced directive(s) in chart? -      Chief Complaint  Patient presents with  . New Admit To SNF    new admit    HPI: Patient is a 70 y.o. male seen today for admission to  Past Medical History:  Diagnosis Date  . Allergy   . Anorexia   . CAD (coronary artery disease)   . Colon polyp   . Diabetes mellitus without complication ([place])   . Diabetic retinopathy ([place])   . GERD (gastroesophageal reflux disease)   . Hyperlipidemia   . Hypertension   . Left hemiparesis ([place])   . PVD (peripheral vascular disease) ([place])   . Stroke [facility])    Past Surgical History:  Procedure Laterality Date  . enteroscopic laser photocoagulation Left 2001  . left bka      reports that he has never smoked. He does not have any smokeless tobacco history on file. His alcohol and drug histories are not on file. Social History   Social History  . Marital status: Married    Spouse name: N/A  . Number of children: N/A  . Years of education: N/A   Occupational History  . Not on file.   Social History Main Topics  . Smoking status: Never Smoker  . Smokeless tobacco: Not on file  . Alcohol use Not on file  . Drug use: Unknown  . Sexual activity: Not on file   Other Topics Concern  . Not on file   Social History Narrative  . No narrative on file     Functional Status Survey:    No family history on file.  Health Maintenance  Topic Date Due  . INFLUENZA VACCINE  02/22/2016  . FOOT EXAM  07/24/2016 (Originally 10/16/1955)  . OPHTHALMOLOGY EXAM  07/24/2016 (Originally 09/20/2011)  . COLONOSCOPY  07/24/2016 (Originally 10/16/1995)  . PNA vac Low Risk Adult (1 of 2 - PCV13) 07/24/2016 (Originally 10/16/2010)  . ZOSTAVAX  07/25/2023 (Originally 10/15/2005)  . TETANUS/TDAP  07/25/2023 (Originally 10/15/1964)  . Hepatitis C Screening  07/25/2023 (Originally 04/28/1946)  . HEMOGLOBIN A1C  06/30/2016    Allergies  Allergen Reactions  . Codeine       Medication List       Accurate as of 05/01/16 11:37 AM. Always use your most recent med list.          buPROPion 150 MG 12 hr tablet Commonly known as:  WELLBUTRIN SR Take 150 mg by mouth daily. For depression   CEFTRIAXONE SODIUM IV 2 g in sodium chloride 0.9% 100 ml IVPM for 4 days   ciprofloxacin 500 MG tablet Commonly known as:  CIPRO Take 500 mg by mouth 2 (two) times daily. Start taking on:  05/03/2016   clopidogrel 75 MG tablet Commonly known as:  PLAVIX Take 75 mg by  mouth daily.   docusate sodium 100 MG capsule Commonly known as:  COLACE Take 200 mg by mouth 3 (three) times daily.   enalapril 5 MG tablet Commonly known as:  VASOTEC Take 5 mg by mouth daily.   feeding supplement (GLUCERNA SHAKE) Liqd Take 237 mLs by mouth 2 (two) times daily between meals. Between breakfast and lunch   feeding supplement (PRO-STAT SUGAR FREE 64) Liqd Take 30 mLs by mouth 2 (two) times daily. Wound healing   ipratropium-albuterol 0.5-2.5 (3) MG/3ML Soln Commonly known as:  DUONEB Take 3 mLs by nebulization every 6 (six) hours as needed. Sob, congestion/wheezing   meclizine 25 MG tablet Commonly known as:  ANTIVERT Take 25 mg by mouth 3 (three) times daily. For dizziness   metFORMIN 1000 MG tablet Commonly known as:  GLUCOPHAGE Take 1,000 mg by mouth 2 (two) times  daily with a meal.   mirtazapine 15 MG tablet Commonly known as:  REMERON Take 7.5 mg by mouth at bedtime.   olopatadine 0.1 % ophthalmic solution Commonly known as:  PATANOL Place 1 drop into both eyes daily.   ondansetron 4 MG tablet Commonly known as:  ZOFRAN Take 4 mg by mouth every 6 (six) hours as needed for nausea or vomiting.   pantoprazole 40 MG tablet Commonly known as:  PROTONIX Take 40 mg by mouth daily. For reflux   sertraline 100 MG tablet Commonly known as:  ZOLOFT Take 100 mg by mouth daily.   tamsulosin 0.4 MG Caps capsule Commonly known as:  FLOMAX Take 0.4 mg by mouth at bedtime.   TOUJEO SOLOSTAR 300 UNIT/ML Sopn Generic drug:  Insulin Glargine Inject 10 Units into the skin at bedtime.       Review of Systems  Vitals:   05/01/16 1115  BP: 106/71  Pulse: 95  Resp: 17  Temp: 97.9 F (36.6 C)  TempSrc: Oral  Weight: 124 lb (56.2 kg)   Body mass index is 16.36 kg/m. Physical Exam  Labs reviewed: Basic Metabolic Panel:  Recent Labs  12/30/15 01/03/16 04/27/16  NA 140 136* 139  K 4.1 4.0 4.5  BUN 21 15 10   CREATININE 0.7 0.7 0.7   Liver Function Tests:  Recent Labs  12/30/15  AST 19  ALT 11  ALKPHOS 88   No results for input(s): LIPASE, AMYLASE in the last 8760 hours. No results for input(s): AMMONIA in the last 8760 hours. CBC:  Recent Labs  10/13/15 12/30/15 01/03/16  WBC 10.2 11.5 10.7  HGB 11.9* 12.7* 11.9*  HCT 36* 38* 36*  PLT 175 198 176   Cardiac Enzymes: No results for input(s): CKTOTAL, CKMB, CKMBINDEX, TROPONINI in the last 8760 hours. BNP: Invalid input(s): POCBNP Lab Results  Component Value Date   HGBA1C 5.3 12/30/2015   Lab Results  Component Value Date   TSH 1.67 12/30/2015   No results found for: VITAMINB12 No results found for: FOLATE No results found for: IRON, TIBC, FERRITIN  Imaging and Procedures obtained prior to SNF admission: Nm Bone Scan Whole Body  Result Date: 06/01/2015 CLINICAL  DATA:  Prostate cancer EXAM: NUCLEAR MEDICINE WHOLE BODY BONE SCAN TECHNIQUE: Whole body anterior and posterior images were obtained approximately 3 hours after intravenous injection of radiopharmaceutical. RADIOPHARMACEUTICALS:  25.0 MCi Technetium-64m MDP IV COMPARISON:  10/08/2014 FINDINGS: Patient is status post left above the knee amputation. Increased activity in the cervical spine, shoulders and sternoclavicular joints compatible with degenerative changes. Multiple foci of increased activity noted within the ribs bilaterally. This  was not definitively seen on prior study. Cannot exclude metastatic disease. IMPRESSION: Subtle scattered foci of increased activity within the ribs bilaterally. Cannot exclude bony metastatic disease. Degenerative uptake in the shoulders, sternoclavicular joints and cervical spine. Electronically Signed   By: Rolm Baptise M.D.   On: 06/01/2015 15:36    Assessment/Plan There are no diagnoses linked to this encounter.   Family/ staff Communication:   Labs/tests ordered:

## 2016-05-02 ENCOUNTER — Non-Acute Institutional Stay (SKILLED_NURSING_FACILITY): Payer: Medicare Other | Admitting: Internal Medicine

## 2016-05-02 ENCOUNTER — Encounter: Payer: Self-pay | Admitting: Internal Medicine

## 2016-05-02 DIAGNOSIS — C61 Malignant neoplasm of prostate: Secondary | ICD-10-CM

## 2016-05-02 DIAGNOSIS — R338 Other retention of urine: Secondary | ICD-10-CM

## 2016-05-02 LAB — BASIC METABOLIC PANEL
BUN: 16 mg/dL (ref 4–21)
Creatinine: 0.8 mg/dL (ref 0.6–1.3)
GLUCOSE: 88 mg/dL
Potassium: 3.4 mmol/L (ref 3.4–5.3)
SODIUM: 143 mmol/L (ref 137–147)

## 2016-05-02 LAB — CBC AND DIFFERENTIAL
HEMATOCRIT: 29 % — AB (ref 41–53)
HEMOGLOBIN: 9.6 g/dL — AB (ref 13.5–17.5)
PLATELETS: 172 10*3/uL (ref 150–399)
WBC: 8.2 10^3/mL

## 2016-05-02 NOTE — Progress Notes (Signed)
Patient ID: Jeremiah Baldwin, male   DOB: Jan 15, 1946, 70 y.o.   MRN: [id number] : Provider:   Location:  [place] Room Number: [place] of Service:  SNF (31)  PCP: No primary care provider on file. No care team member to display  Extended Emergency Contact Information Primary Emergency Contact: Molner,CAROLYN Address: [place],  [phone] Home Phone: [phone] Relation: None     Allergies: Codeine  Chief Complaint  Patient presents with  . Acute Visit    HPI: Patient is 70 y.o. male who is being seen acutely about urinary retention. Apparently over the weekend pt was cathed with residual of 400 cc.zfamily had noted suprapubic swelling. At the time pt's diaper was completely wet. So I think it has gone unnoticed because pt is still urinating a lot.   Past Medical History:  Diagnosis Date  . Allergy   . Anorexia   . CAD (coronary artery disease)   . Colon polyp   . Diabetes mellitus without complication ([place])   . Diabetic retinopathy ([place])   . GERD (gastroesophageal reflux disease)   . Hyperlipidemia   . Hypertension   . Left hemiparesis ([place])   . PVD (peripheral vascular disease) ([place])   . Stroke [facility])     Past Surgical History:  Procedure Laterality Date  . enteroscopic laser photocoagulation Left 2001  . left bka        Medication List       Accurate as of 05/02/16  3:04 PM. Always use your most recent med list.          buPROPion 150 MG 12 hr tablet Commonly known as:  WELLBUTRIN SR Take 150 mg by mouth daily. For depression   CEFTRIAXONE SODIUM IV 2 g in sodium chloride 0.9% 100 ml IVPM for 4 days   ciprofloxacin 500 MG tablet Commonly known as:  CIPRO Take 500 mg by mouth 2 (two) times daily. Start taking on:  05/03/2016   clopidogrel 75 MG tablet Commonly known as:  PLAVIX Take 75 mg by mouth daily.   docusate sodium 100 MG capsule Commonly known as:  COLACE Take 200 mg by  mouth 3 (three) times daily.   enalapril 5 MG tablet Commonly known as:  VASOTEC Take 5 mg by mouth daily.   feeding supplement (GLUCERNA SHAKE) Liqd Take 237 mLs by mouth 2 (two) times daily between meals. Between breakfast and lunch   feeding supplement (PRO-STAT SUGAR FREE 64) Liqd Take 30 mLs by mouth 2 (two) times daily. Wound healing   ipratropium-albuterol 0.5-2.5 (3) MG/3ML Soln Commonly known as:  DUONEB Take 3 mLs by nebulization every 6 (six) hours as needed. Sob, congestion/wheezing   meclizine 25 MG tablet Commonly known as:  ANTIVERT Take 25 mg by mouth 3 (three) times daily. For dizziness   metFORMIN 1000 MG tablet Commonly known as:  GLUCOPHAGE Take 1,000 mg by mouth 2 (two) times daily with a meal.   mirtazapine 15 MG tablet Commonly known as:  REMERON Take 7.5 mg by mouth at bedtime.   olopatadine 0.1 % ophthalmic solution Commonly known as:  PATANOL Place 1 drop into both eyes daily.   ondansetron 4 MG tablet Commonly known as:  ZOFRAN Take 4 mg by mouth every 6 (six) hours as needed for nausea or vomiting.   pantoprazole 40 MG tablet Commonly known as:  PROTONIX Take 40 mg by mouth  daily. For reflux   sertraline 100 MG tablet Commonly known as:  ZOLOFT Take 100 mg by mouth daily.   tamsulosin 0.4 MG Caps capsule Commonly known as:  FLOMAX Take 0.4 mg by mouth at bedtime.   TOUJEO SOLOSTAR 300 UNIT/ML Sopn Generic drug:  Insulin Glargine Inject 10 Units into the skin at bedtime.       No orders of the defined types were placed in this encounter.   Immunization History  Administered Date(s) Administered  . PPD Test 05/12/2009    Social History  Substance Use Topics  . Smoking status: Never Smoker  . Smokeless tobacco: Not on file  . Alcohol use Not on file    Family history is   Family History  Problem Relation Age of Onset  . Diabetes Mother       Review of Systems  DATA OBTAINED: from patient - pt never has c/o; nurse  - as per HPI GENERAL:  no fevers, fatigue, appetite changes SKIN: No itching, or rash EYES: No eye pain, redness, discharge EARS: No earache, tinnitus, change in hearing NOSE: No congestion, drainage or bleeding  MOUTH/THROAT: No mouth or tooth pain, No sore throat RESPIRATORY: No cough, wheezing, SOB CARDIAC: No chest pain, palpitations, lower extremity edema  GI: No abdominal pain, No N/V/D or constipation, No heartburn or reflux  GU: No dysuria, frequency or urgency, or incontinence  MUSCULOSKELETAL: No unrelieved bone/joint pain NEUROLOGIC: No headache, dizziness or focal weakness PSYCHIATRIC: No c/o anxiety or sadness   Vitals:   05/02/16 1459  BP: 106/71  Pulse: (!) 101  Resp: 18  Temp: 97.9 F (36.6 C)    SpO2 Readings from Last 1 Encounters:  No data found for SpO2   Body mass index is 16.36 kg/m.     Physical Exam  GENERAL APPEARANCE: Alert, min conversant,  No acute distress.  SKIN: No diaphoresis rash HEAD: Normocephalic, atraumatic  EYES: Conjunctiva/lids clear. Pupils round, reactive. EOMs intact.  EARS: External exam WNL, canals clear. Hearing grossly normal.  NOSE: No deformity or discharge.  MOUTH/THROAT: Lips w/o lesions  RESPIRATORY: Breathing is even, unlabored. Lung sounds are clear   CARDIOVASCULAR: Heart RRR no murmurs, rubs or gallops. No peripheral edema.   GASTROINTESTINAL: Abdomen is soft, non-tender, not distended w/ normal bowel sounds. GENITOURINARY: Bladder mild tender, mild distended  MUSCULOSKELETAL:very thin, very wasted NEUROLOGIC:  Cranial nerves 2-12 grossly intact. Moves upper  extremities  PSYCHIATRIC: Mood and affect appropriate to situation, no behavioral issues  Patient Active Problem List   Diagnosis Date Noted  . Severe sepsis ([place]) 05/01/2016  . AKI (acute kidney injury) ([place]) 05/01/2016  . CAD (coronary artery disease) 05/01/2016  . Acute osteomyelitis of toe of right foot ([place]) 03/25/2016  . Leukocytosis 01/02/2016    . Anxiety state 07/02/2015  . Prostate cancer ([place]) 10/30/2014  . Fever presenting with conditions classified elsewhere 10/04/2014  . Acute urinary retention 09/16/2014  . Taking medication for chronic disease 09/07/2014  . Leukocytosis, unspecified 03/03/2014  . Peripheral vascular disease ([place]) 02/25/2014  . Conjunctivitis 02/25/2014  . Itching 10/23/2013  . Depression 04/17/2013  . Pruritic disorder 03/11/2013  . Urinary tract infection 02/27/2013  . Pure hypercholesterolemia 11/29/2012  . 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 Hyperlipidemia LDL goal <70 11/11/2012  . Loss of weight 11/11/2012  . Essential hypertension, benign 11/11/2012  . Unspecified constipation 11/11/2012  .  Type 2 diabetes with complication ([place]) 99991111  . Benign paroxysmal positional vertigo 11/11/2012  . CVA (cerebral vascular accident) ([place]) 11/11/2012  . Insomnia 11/11/2012  . GERD (gastroesophageal reflux disease) 11/11/2012      Labs reviewed: Basic Metabolic Panel:    Component Value Date/Time   NA 139 04/27/2016   K 4.5 04/27/2016   BUN 10 04/27/2016   CREATININE 0.7 04/27/2016   AST 19 12/30/2015   ALT 11 12/30/2015   ALKPHOS 88 12/30/2015     Recent Labs  12/30/15 01/03/16 04/27/16  NA 140 136* 139  K 4.1 4.0 4.5  BUN 21 15 10   CREATININE 0.7 0.7 0.7   Liver Function Tests:  Recent Labs  12/30/15  AST 19  ALT 11  ALKPHOS 88   No results for input(s): LIPASE, AMYLASE in the last 8760 hours. No results for input(s): AMMONIA in the last 8760 hours. CBC:  Recent Labs  10/13/15 12/30/15 01/03/16  WBC 10.2 11.5 10.7  HGB 11.9* 12.7* 11.9*  HCT 36* 38* 36*  PLT 175 198 176   Lipid  Recent Labs  09/29/15 12/30/15  CHOL 163 138  HDL 35 47  LDLCALC 104 76  TRIG 122 77    Cardiac Enzymes: No results for input(s): CKTOTAL,  CKMB, CKMBINDEX, TROPONINI in the last 8760 hours. BNP: No results for input(s): BNP in the last 8760 hours. No results found for: [facility] Lab Results  Component Value Date   HGBA1C 5.3 12/30/2015   Lab Results  Component Value Date   TSH 1.67 12/30/2015   No results found for: VITAMINB12 No results found for: FOLATE No results found for: IRON, TIBC, FERRITIN  Imaging and Procedures obtained prior to SNF admission: Nm Bone Scan Whole Body  Result Date: 06/01/2015 CLINICAL DATA:  Prostate cancer EXAM: NUCLEAR MEDICINE WHOLE BODY BONE SCAN TECHNIQUE: Whole body anterior and posterior images were obtained approximately 3 hours after intravenous injection of radiopharmaceutical. RADIOPHARMACEUTICALS:  25.0 MCi Technetium-8m MDP IV COMPARISON:  10/08/2014 FINDINGS: Patient is status post left above the knee amputation. Increased activity in the cervical spine, shoulders and sternoclavicular joints compatible with degenerative changes. Multiple foci of increased activity noted within the ribs bilaterally. This was not definitively seen on prior study. Cannot exclude metastatic disease. IMPRESSION: Subtle scattered foci of increased activity within the ribs bilaterally. Cannot exclude bony metastatic disease. Degenerative uptake in the shoulders, sternoclavicular joints and cervical spine. Electronically Signed   By: Rolm Baptise M.D.   On: 06/01/2015 15:36     Not all labs, radiology exams or other studies done during hospitalization come through on my EPIC note; however they are reviewed by me.    Assessment and Plan  URINARY RETENTION/ PROSTATE CA - had to speak to several staff to get the full story;written for foley to SD and for Urology f/u as out pt for urinary retention   Time spent > 25 min Inocencio Homes, MD

## 2016-05-03 LAB — BASIC METABOLIC PANEL: CREATININE: 0.6 mg/dL (ref 0.6–1.3)

## 2016-05-08 LAB — BASIC METABOLIC PANEL
BUN: 14 mg/dL (ref 4–21)
Creatinine: 0.6 mg/dL (ref 0.6–1.3)
Glucose: 78 mg/dL
Potassium: 3.5 mmol/L (ref 3.4–5.3)
Sodium: 144 mmol/L (ref 137–147)

## 2016-05-08 LAB — CBC AND DIFFERENTIAL
HCT: 29 % — AB (ref 41–53)
HEMOGLOBIN: 9.4 g/dL — AB (ref 13.5–17.5)
Platelets: 224 10*3/uL (ref 150–399)
WBC: 8 10*3/mL

## 2016-05-14 ENCOUNTER — Encounter: Payer: Self-pay | Admitting: Internal Medicine

## 2016-05-16 LAB — BASIC METABOLIC PANEL
BUN: 17 mg/dL (ref 4–21)
Creatinine: 0.6 mg/dL (ref 0.6–1.3)
Creatinine: 0.7 mg/dL (ref 0.6–1.3)
GLUCOSE: 89 mg/dL
Potassium: 3.5 mmol/L (ref 3.4–5.3)
SODIUM: 141 mmol/L (ref 137–147)

## 2016-05-16 LAB — CBC AND DIFFERENTIAL
HEMATOCRIT: 26 % — AB (ref 41–53)
HEMOGLOBIN: 8.6 g/dL — AB (ref 13.5–17.5)
PLATELETS: 198 10*3/uL (ref 150–399)
WBC: 8.1 10^3/mL

## 2016-05-22 LAB — CBC AND DIFFERENTIAL
HEMATOCRIT: 26 % — AB (ref 41–53)
HEMOGLOBIN: 8.5 g/dL — AB (ref 13.5–17.5)
Platelets: 182 10*3/uL (ref 150–399)
WBC: 6.2 10^3/mL

## 2016-05-22 LAB — BASIC METABOLIC PANEL
BUN: 14 mg/dL (ref 4–21)
Creatinine: 0.7 mg/dL (ref 0.6–1.3)
GLUCOSE: 78 mg/dL
Potassium: 3.5 mmol/L (ref 3.4–5.3)
SODIUM: 142 mmol/L (ref 137–147)

## 2016-05-22 LAB — HEPATIC FUNCTION PANEL
ALK PHOS: 84 U/L (ref 25–125)
ALT: 6 U/L — AB (ref 10–40)
AST: 13 U/L — AB (ref 14–40)
BILIRUBIN, TOTAL: 0.3 mg/dL

## 2016-05-31 ENCOUNTER — Non-Acute Institutional Stay (SKILLED_NURSING_FACILITY): Payer: Medicare Other | Admitting: Internal Medicine

## 2016-05-31 ENCOUNTER — Encounter: Payer: Self-pay | Admitting: Internal Medicine

## 2016-05-31 DIAGNOSIS — D638 Anemia in other chronic diseases classified elsewhere: Secondary | ICD-10-CM | POA: Diagnosis not present

## 2016-05-31 DIAGNOSIS — I10 Essential (primary) hypertension: Secondary | ICD-10-CM | POA: Diagnosis not present

## 2016-05-31 DIAGNOSIS — K219 Gastro-esophageal reflux disease without esophagitis: Secondary | ICD-10-CM | POA: Diagnosis not present

## 2016-05-31 NOTE — Progress Notes (Signed)
Location:  [place] Room Number: [phone] Place of Service:  SNF [phone])  Jeremiah Baldwin. Sheppard Coil, MD  No care team member to display  Extended Emergency Contact Information Primary Emergency Contact: Kulesza,CAROLYN Address: [place],  [phone] Home Phone: [phone] Relation: None    Allergies: Codeine  Chief Complaint  Patient presents with  . Medical Management of Chronic Issues    Routine Visit    HPI: Patient is 70 y.o. male who is being seen fro routine issues of HTN, GERD and anemia of chronic disease.  Past Medical History:  Diagnosis Date  . Allergy   . Anorexia   . CAD (coronary artery disease)   . Colon polyp   . Diabetes mellitus without complication ([place])   . Diabetic retinopathy ([place])   . GERD (gastroesophageal reflux disease)   . Hyperlipidemia   . Hypertension   . Left hemiparesis ([place])   . PVD (peripheral vascular disease) ([place])   . Stroke [facility])     Past Surgical History:  Procedure Laterality Date  . enteroscopic laser photocoagulation Left 2001  . left bka        Medication List       Accurate as of 05/31/16 11:59 PM. Always use your most recent med list.          buPROPion 150 MG 12 hr tablet Commonly known as:  WELLBUTRIN SR Take 150 mg by mouth daily. For depression   clopidogrel 75 MG tablet Commonly known as:  PLAVIX Take 75 mg by mouth daily.   docusate sodium 100 MG capsule Commonly known as:  COLACE Take 200 mg by mouth 3 (three) times daily.   enalapril 5 MG tablet Commonly known as:  VASOTEC Take 5 mg by mouth daily.   feeding supplement (GLUCERNA SHAKE) Liqd Take 237 mLs by mouth 2 (two) times daily between meals. Between breakfast and lunch   feeding supplement (PRO-STAT SUGAR FREE 64) Liqd Take 30 mLs by mouth 2 (two) times daily. Wound healing   ipratropium-albuterol 0.5-2.5 (3) MG/3ML Soln Commonly known as:  DUONEB Take 3 mLs by nebulization every 6  (six) hours as needed. Sob, congestion/wheezing   meclizine 25 MG tablet Commonly known as:  ANTIVERT Take 25 mg by mouth 3 (three) times daily. For dizziness   metFORMIN 1000 MG tablet Commonly known as:  GLUCOPHAGE Take 1,000 mg by mouth 2 (two) times daily with a meal.   mirtazapine 15 MG tablet Commonly known as:  REMERON Take 7.5 mg by mouth at bedtime.   olopatadine 0.1 % ophthalmic solution Commonly known as:  PATANOL Place 1 drop into both eyes daily.   ondansetron 4 MG tablet Commonly known as:  ZOFRAN Take 4 mg by mouth every 6 (six) hours as needed for nausea or vomiting.   pantoprazole 40 MG tablet Commonly known as:  PROTONIX Take 40 mg by mouth daily. For reflux   sertraline 100 MG tablet Commonly known as:  ZOLOFT Take 100 mg by mouth daily.   tamsulosin 0.4 MG Caps capsule Commonly known as:  FLOMAX Take 0.4 mg by mouth at bedtime.   TOUJEO SOLOSTAR 300 UNIT/ML Sopn Generic drug:  Insulin Glargine Inject 10 Units into the skin at bedtime.       No orders of the defined types were placed in this encounter.   Immunization History  Administered Date(s) Administered  . PPD Test 05/12/2009  Social History  Substance Use Topics  . Smoking status: Never Smoker  . Smokeless tobacco: Not on file  . Alcohol use Not on file    Review of Systems  DATA OBTAINED: from patient- no c/o, doesn't need anything GENERAL:  no fevers, fatigue, appetite changes SKIN: No itching, rash HEENT: No complaint RESPIRATORY: No cough, wheezing, SOB CARDIAC: No chest pain, palpitations, lower extremity edema  GI: No abdominal pain, No N/V/D or constipation, No heartburn or reflux  GU: No dysuria, frequency or urgency, or incontinence  MUSCULOSKELETAL: No unrelieved bone/joint pain NEUROLOGIC: No headache, dizziness  PSYCHIATRIC: No overt anxiety or sadness  Vitals:   05/31/16 1305  BP: 106/71  Pulse: 69  Resp: 16  Temp: 97.9 F (36.6 C)   Body mass index  is 15.34 kg/m. Physical Exam  GENERAL APPEARANCE: Alert, mod conversant, No acute distress  SKIN: No diaphoresis rash HEENT: Unremarkable RESPIRATORY: Breathing is even, unlabored. Lung sounds are clear   CARDIOVASCULAR: Heart RRR no murmurs, rubs or gallops. No peripheral edema  GASTROINTESTINAL: Abdomen is soft, non-tender, not distended w/ normal bowel sounds.  GENITOURINARY: Bladder non tender, not distended  MUSCULOSKELETAL: L BKA NEUROLOGIC: Cranial nerves 2-12 grossly intact; L hemiparesis PSYCHIATRIC: Mood and affect appropriate to situation, no behavioral issues  Patient Active Problem List   Diagnosis Date Noted  . Anemia of chronic disease 06/04/2016  . Severe sepsis ([place]) 05/01/2016  . AKI (acute kidney injury) ([place]) 05/01/2016  . CAD (coronary artery disease) 05/01/2016  . Acute osteomyelitis of toe of right foot ([place]) 03/25/2016  . Leukocytosis 01/02/2016  . Anxiety state 07/02/2015  . Prostate cancer ([place]) 10/30/2014  . Fever presenting with conditions classified elsewhere 10/04/2014  . Acute urinary retention 09/16/2014  . Taking medication for chronic disease 09/07/2014  . Leukocytosis, unspecified 03/03/2014  . Peripheral vascular disease ([place]) 02/25/2014  . Conjunctivitis 02/25/2014  . Itching 10/23/2013  . Depression 04/17/2013  . Pruritic disorder 03/11/2013  . Urinary tract infection 02/27/2013  . Pure hypercholesterolemia 11/29/2012  . 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 Hyperlipidemia LDL goal <70 11/11/2012  . Loss of weight 11/11/2012  . Essential hypertension, benign 11/11/2012  . Unspecified constipation 11/11/2012  . Type 2 diabetes with complication ([place]) 99991111  . Benign paroxysmal positional vertigo 11/11/2012  . CVA (cerebral vascular accident) ([place]) 11/11/2012  . Insomnia 11/11/2012  . GERD  (gastroesophageal reflux disease) 11/11/2012    CMP     Component Value Date/Time   NA 142 05/22/2016   K 3.5 05/22/2016   BUN 14 05/22/2016   CREATININE 0.7 05/22/2016   AST 13 (A) 05/22/2016   ALT 6 (A) 05/22/2016   ALKPHOS 84 05/22/2016   05/22/16  Albumin 3.0  Recent Labs  04/27/16 05/08/16 05/22/16  NA 139 144 142  K 4.5 3.5 3.5  BUN 10 14 14   CREATININE 0.7 0.6 0.7    Recent Labs  12/30/15 05/22/16  AST 19 13*  ALT 11 6*  ALKPHOS 88 84    Recent Labs  01/03/16 05/08/16 05/22/16  WBC 10.7 8.0 6.2  HGB 11.9* 9.4* 8.5*  HCT 36* 29* 26*  PLT 176 224 182    Recent Labs  09/29/15 12/30/15  CHOL 163 138  LDLCALC 104 76  TRIG 122 77   No results found for: [facility] Lab Results  Component Value Date   TSH 1.67 12/30/2015  Lab Results  Component Value Date   HGBA1C 5.3 12/30/2015   Lab Results  Component Value Date   CHOL 138 12/30/2015   HDL 47 12/30/2015   LDLCALC 76 12/30/2015   TRIG 77 12/30/2015    Significant Diagnostic Results in last 30 days:  No results found.  Assessment and Plan  Essential hypertension, benign Well controlled;plan to cont norvasc 5 mg daily  GERD (gastroesophageal reflux disease) No reports of reflux or aspiration;plan to cont protonix 40 mg daily  Anemia of chronic disease Pt's HB has been dropping steadily dropping in past 6 months, pt has been slowly FTT in addition to  Prostate CA and current osteo of R foot; will cont to monitor, solution will probably be a transfusion    Webb Silversmith D. Sheppard Coil, MD

## 2016-06-04 ENCOUNTER — Encounter: Payer: Self-pay | Admitting: Internal Medicine

## 2016-06-04 DIAGNOSIS — D638 Anemia in other chronic diseases classified elsewhere: Secondary | ICD-10-CM | POA: Insufficient documentation

## 2016-06-04 NOTE — Assessment & Plan Note (Signed)
No reports of reflux or aspiration ;plan to cont protonix 40 mg daily 

## 2016-06-04 NOTE — Assessment & Plan Note (Signed)
Pt's HB has been dropping steadily dropping in past 6 months, pt has been slowly FTT in addition to  Prostate CA and current osteo of R foot; will cont to monitor, solution will probably be a transfusion

## 2016-06-04 NOTE — Assessment & Plan Note (Signed)
Well controlled;plan to cont norvasc 5 mg daily

## 2016-06-08 LAB — HM DIABETES FOOT EXAM

## 2016-06-11 ENCOUNTER — Inpatient Hospital Stay (HOSPITAL_COMMUNITY)
Admission: EM | Admit: 2016-06-11 | Discharge: 2016-06-17 | DRG: 871 | Disposition: A | Discharge status: Skilled Nursing Facility | Payer: Medicare Other | Attending: Internal Medicine | Admitting: Internal Medicine

## 2016-06-11 ENCOUNTER — Encounter (HOSPITAL_COMMUNITY): Payer: Self-pay

## 2016-06-11 ENCOUNTER — Emergency Department (HOSPITAL_COMMUNITY): Payer: Medicare Other

## 2016-06-11 DIAGNOSIS — J189 Pneumonia, unspecified organism: Secondary | ICD-10-CM | POA: Diagnosis present

## 2016-06-11 DIAGNOSIS — K219 Gastro-esophageal reflux disease without esophagitis: Secondary | ICD-10-CM | POA: Diagnosis present

## 2016-06-11 DIAGNOSIS — E1151 Type 2 diabetes mellitus with diabetic peripheral angiopathy without gangrene: Secondary | ICD-10-CM | POA: Diagnosis present

## 2016-06-11 DIAGNOSIS — Z681 Body mass index (BMI) 19 or less, adult: Secondary | ICD-10-CM | POA: Diagnosis not present

## 2016-06-11 DIAGNOSIS — R64 Cachexia: Secondary | ICD-10-CM | POA: Diagnosis present

## 2016-06-11 DIAGNOSIS — I348 Other nonrheumatic mitral valve disorders: Secondary | ICD-10-CM | POA: Diagnosis not present

## 2016-06-11 DIAGNOSIS — Z8546 Personal history of malignant neoplasm of prostate: Secondary | ICD-10-CM | POA: Diagnosis not present

## 2016-06-11 DIAGNOSIS — M86671 Other chronic osteomyelitis, right ankle and foot: Secondary | ICD-10-CM | POA: Diagnosis present

## 2016-06-11 DIAGNOSIS — D638 Anemia in other chronic diseases classified elsewhere: Secondary | ICD-10-CM | POA: Diagnosis present

## 2016-06-11 DIAGNOSIS — Y95 Nosocomial condition: Secondary | ICD-10-CM | POA: Diagnosis present

## 2016-06-11 DIAGNOSIS — I2510 Atherosclerotic heart disease of native coronary artery without angina pectoris: Secondary | ICD-10-CM | POA: Diagnosis present

## 2016-06-11 DIAGNOSIS — R6520 Severe sepsis without septic shock: Secondary | ICD-10-CM | POA: Diagnosis present

## 2016-06-11 DIAGNOSIS — I69959 Hemiplegia and hemiparesis following unspecified cerebrovascular disease affecting unspecified side: Secondary | ICD-10-CM | POA: Diagnosis not present

## 2016-06-11 DIAGNOSIS — E118 Type 2 diabetes mellitus with unspecified complications: Secondary | ICD-10-CM | POA: Diagnosis not present

## 2016-06-11 DIAGNOSIS — Z7902 Long term (current) use of antithrombotics/antiplatelets: Secondary | ICD-10-CM

## 2016-06-11 DIAGNOSIS — Z22322 Carrier or suspected carrier of Methicillin resistant Staphylococcus aureus: Secondary | ICD-10-CM

## 2016-06-11 DIAGNOSIS — Z89512 Acquired absence of left leg below knee: Secondary | ICD-10-CM

## 2016-06-11 DIAGNOSIS — R059 Cough, unspecified: Secondary | ICD-10-CM

## 2016-06-11 DIAGNOSIS — E11319 Type 2 diabetes mellitus with unspecified diabetic retinopathy without macular edema: Secondary | ICD-10-CM | POA: Diagnosis present

## 2016-06-11 DIAGNOSIS — E43 Unspecified severe protein-calorie malnutrition: Secondary | ICD-10-CM | POA: Diagnosis present

## 2016-06-11 DIAGNOSIS — J9601 Acute respiratory failure with hypoxia: Secondary | ICD-10-CM | POA: Diagnosis present

## 2016-06-11 DIAGNOSIS — Z66 Do not resuscitate: Secondary | ICD-10-CM | POA: Diagnosis present

## 2016-06-11 DIAGNOSIS — Z833 Family history of diabetes mellitus: Secondary | ICD-10-CM

## 2016-06-11 DIAGNOSIS — I639 Cerebral infarction, unspecified: Secondary | ICD-10-CM | POA: Diagnosis not present

## 2016-06-11 DIAGNOSIS — Z8673 Personal history of transient ischemic attack (TIA), and cerebral infarction without residual deficits: Secondary | ICD-10-CM | POA: Diagnosis not present

## 2016-06-11 DIAGNOSIS — E1169 Type 2 diabetes mellitus with other specified complication: Secondary | ICD-10-CM | POA: Diagnosis present

## 2016-06-11 DIAGNOSIS — Z7984 Long term (current) use of oral hypoglycemic drugs: Secondary | ICD-10-CM | POA: Diagnosis not present

## 2016-06-11 DIAGNOSIS — Z79899 Other long term (current) drug therapy: Secondary | ICD-10-CM

## 2016-06-11 DIAGNOSIS — I10 Essential (primary) hypertension: Secondary | ICD-10-CM | POA: Diagnosis present

## 2016-06-11 DIAGNOSIS — B957 Other staphylococcus as the cause of diseases classified elsewhere: Secondary | ICD-10-CM | POA: Diagnosis present

## 2016-06-11 DIAGNOSIS — A411 Sepsis due to other specified staphylococcus: Secondary | ICD-10-CM | POA: Diagnosis not present

## 2016-06-11 DIAGNOSIS — A419 Sepsis, unspecified organism: Principal | ICD-10-CM

## 2016-06-11 DIAGNOSIS — F329 Major depressive disorder, single episode, unspecified: Secondary | ICD-10-CM | POA: Diagnosis present

## 2016-06-11 DIAGNOSIS — Z885 Allergy status to narcotic agent status: Secondary | ICD-10-CM

## 2016-06-11 DIAGNOSIS — E785 Hyperlipidemia, unspecified: Secondary | ICD-10-CM | POA: Diagnosis present

## 2016-06-11 DIAGNOSIS — R7881 Bacteremia: Secondary | ICD-10-CM | POA: Diagnosis not present

## 2016-06-11 DIAGNOSIS — I69354 Hemiplegia and hemiparesis following cerebral infarction affecting left non-dominant side: Secondary | ICD-10-CM | POA: Diagnosis not present

## 2016-06-11 DIAGNOSIS — L89302 Pressure ulcer of unspecified buttock, stage 2: Secondary | ICD-10-CM | POA: Diagnosis not present

## 2016-06-11 DIAGNOSIS — I739 Peripheral vascular disease, unspecified: Secondary | ICD-10-CM | POA: Diagnosis present

## 2016-06-11 DIAGNOSIS — R05 Cough: Secondary | ICD-10-CM

## 2016-06-11 LAB — CBC AND DIFFERENTIAL
HEMATOCRIT: 35 % — AB (ref 41–53)
HEMATOCRIT: 36 % — AB (ref 41–53)
HEMOGLOBIN: 11.3 g/dL — AB (ref 13.5–17.5)
HEMOGLOBIN: 11.4 g/dL — AB (ref 13.5–17.5)
PLATELETS: 241 10*3/uL (ref 150–399)
PLATELETS: 292 10*3/uL (ref 150–399)
WBC: 19.7 10*3/mL
WBC: 22.9 10^3/mL

## 2016-06-11 LAB — I-STAT ARTERIAL BLOOD GAS, ED
Acid-base deficit: 2 mmol/L (ref 0.0–2.0)
Bicarbonate: 22.3 mmol/L (ref 20.0–28.0)
O2 SAT: 88 %
PCO2 ART: 34.9 mmHg (ref 32.0–48.0)
PH ART: 7.413 (ref 7.350–7.450)
PO2 ART: 53 mmHg — AB (ref 83.0–108.0)
TCO2: 23 mmol/L (ref 0–100)

## 2016-06-11 LAB — CBC WITH DIFFERENTIAL/PLATELET
BASOS PCT: 0 %
Basophils Absolute: 0 10*3/uL (ref 0.0–0.1)
Basophils Absolute: 0 10*3/uL (ref 0.0–0.1)
Basophils Relative: 0 %
EOS ABS: 0 10*3/uL (ref 0.0–0.7)
EOS PCT: 0 %
EOS PCT: 0 %
Eosinophils Absolute: 0 10*3/uL (ref 0.0–0.7)
HCT: 35.6 % — ABNORMAL LOW (ref 39.0–52.0)
HEMATOCRIT: 35.2 % — AB (ref 39.0–52.0)
HEMOGLOBIN: 11.4 g/dL — AB (ref 13.0–17.0)
Hemoglobin: 11.3 g/dL — ABNORMAL LOW (ref 13.0–17.0)
LYMPHS ABS: 1 10*3/uL (ref 0.7–4.0)
Lymphocytes Relative: 8 %
Lymphocytes Relative: 5 %
Lymphs Abs: 1.7 10*3/uL (ref 0.7–4.0)
MCH: 26.7 pg (ref 26.0–34.0)
MCH: 26.7 pg (ref 26.0–34.0)
MCHC: 31.7 g/dL (ref 30.0–36.0)
MCHC: 32.4 g/dL (ref 30.0–36.0)
MCV: 84.2 fL (ref 78.0–100.0)
MCV: 82.4 fL (ref 78.0–100.0)
MONO ABS: 1.7 10*3/uL — AB (ref 0.1–1.0)
MONOS PCT: 8 %
MONOS PCT: 4 %
Monocytes Absolute: 0.8 10*3/uL (ref 0.1–1.0)
NEUTROS ABS: 17.9 10*3/uL — AB (ref 1.7–7.7)
NEUTROS PCT: 84 %
Neutro Abs: 19.3 10*3/uL — ABNORMAL HIGH (ref 1.7–7.7)
Neutrophils Relative %: 91 %
PLATELETS: 292 10*3/uL (ref 150–400)
Platelets: 241 10*3/uL (ref 150–400)
RBC: 4.23 MIL/uL (ref 4.22–5.81)
RBC: 4.27 MIL/uL (ref 4.22–5.81)
RDW: 16.4 % — AB (ref 11.5–15.5)
RDW: 16.3 % — ABNORMAL HIGH (ref 11.5–15.5)
WBC Morphology: INCREASED
WBC: 22.9 10*3/uL — ABNORMAL HIGH (ref 4.0–10.5)
WBC: 19.7 10*3/uL — AB (ref 4.0–10.5)

## 2016-06-11 LAB — INFLUENZA PANEL BY PCR (TYPE A & B)
INFLBPCR: NEGATIVE
Influenza A By PCR: NEGATIVE

## 2016-06-11 LAB — COMPREHENSIVE METABOLIC PANEL
ALBUMIN: 3.1 g/dL — AB (ref 3.5–5.0)
ALBUMIN: 3.1 g/dL — AB (ref 3.5–5.0)
ALK PHOS: 97 U/L (ref 38–126)
ALT: 9 U/L — ABNORMAL LOW (ref 17–63)
ALT: 10 U/L — AB (ref 17–63)
ANION GAP: 13 (ref 5–15)
ANION GAP: 13 (ref 5–15)
AST: 18 U/L (ref 15–41)
AST: 18 U/L (ref 15–41)
Alkaline Phosphatase: 97 U/L (ref 38–126)
BILIRUBIN TOTAL: 0.7 mg/dL (ref 0.3–1.2)
BUN: 29 mg/dL — ABNORMAL HIGH (ref 6–20)
BUN: 32 mg/dL — AB (ref 6–20)
CALCIUM: 9.2 mg/dL (ref 8.9–10.3)
CO2: 23 mmol/L (ref 22–32)
CO2: 23 mmol/L (ref 22–32)
CREATININE: 0.99 mg/dL (ref 0.61–1.24)
Calcium: 9.2 mg/dL (ref 8.9–10.3)
Chloride: 104 mmol/L (ref 101–111)
Chloride: 101 mmol/L (ref 101–111)
Creatinine, Ser: 1.03 mg/dL (ref 0.61–1.24)
GFR calc Af Amer: >60 mL/min (ref >60)
GFR calc Af Amer: >60 mL/min (ref >60)
GFR calc non Af Amer: >60 mL/min (ref >60)
GFR calc non Af Amer: >60 mL/min (ref >60)
GLUCOSE: 290 mg/dL — AB (ref 65–99)
GLUCOSE: 279 mg/dL — AB (ref 65–99)
POTASSIUM: 4.2 mmol/L (ref 3.5–5.1)
Potassium: 4.3 mmol/L (ref 3.5–5.1)
SODIUM: 140 mmol/L (ref 135–145)
Sodium: 137 mmol/L (ref 135–145)
TOTAL PROTEIN: 7 g/dL (ref 6.5–8.1)
Total Bilirubin: 0.8 mg/dL (ref 0.3–1.2)
Total Protein: 6.7 g/dL (ref 6.5–8.1)

## 2016-06-11 LAB — I-STAT VENOUS BLOOD GAS, ED
Bicarbonate: 25.2 mmol/L (ref 20.0–28.0)
O2 SAT: 73 %
PCO2 VEN: 43 mmHg — AB (ref 44.0–60.0)
PH VEN: 7.376 (ref 7.250–7.430)
PO2 VEN: 40 mmHg (ref 32.0–45.0)
TCO2: 26 mmol/L (ref 0–100)

## 2016-06-11 LAB — BASIC METABOLIC PANEL
BUN: 29 mg/dL — AB (ref 4–21)
BUN: 32 mg/dL — AB (ref 4–21)
CREATININE: 1 mg/dL (ref 0.6–1.3)
Creatinine: 1 mg/dL (ref 0.6–1.3)
Glucose: 279 mg/dL
Glucose: 290 mg/dL
Potassium: 4.2 mmol/L (ref 3.4–5.3)
Potassium: 4.3 mmol/L (ref 3.4–5.3)
SODIUM: 140 mmol/L (ref 137–147)
Sodium: 137 mmol/L (ref 137–147)

## 2016-06-11 LAB — TROPONIN I
Troponin I: 0.1 ng/mL (ref <0.03)
Troponin I: <0.03 ng/mL (ref <0.03)

## 2016-06-11 LAB — LACTIC ACID, PLASMA
LACTIC ACID, VENOUS: 3.4 mmol/L — AB (ref 0.5–1.9)
Lactic Acid, Venous: 2.7 mmol/L (ref 0.5–1.9)

## 2016-06-11 LAB — HEPATIC FUNCTION PANEL
BILIRUBIN, TOTAL: 0.7 mg/dL
BILIRUBIN, TOTAL: 0.8 mg/dL

## 2016-06-11 LAB — APTT: aPTT: 37 seconds — ABNORMAL HIGH (ref 24–36)

## 2016-06-11 LAB — I-STAT CG4 LACTIC ACID, ED
LACTIC ACID, VENOUS: 3.56 mmol/L — AB (ref 0.5–1.9)
Lactic Acid, Venous: 2.58 mmol/L (ref 0.5–1.9)

## 2016-06-11 LAB — PROTIME-INR
INR: 1.14
Prothrombin Time: 14.7 seconds (ref 11.4–15.2)

## 2016-06-11 LAB — MRSA PCR SCREENING: MRSA BY PCR: POSITIVE — AB

## 2016-06-11 LAB — PROCALCITONIN: Procalcitonin: 1.22 ng/mL

## 2016-06-11 LAB — BRAIN NATRIURETIC PEPTIDE: B Natriuretic Peptide: 221.8 pg/mL — ABNORMAL HIGH (ref 0.0–100.0)

## 2016-06-11 MED ORDER — BUPROPION HCL ER (SR) 150 MG PO TB12
150.0000 mg | ORAL_TABLET | Freq: Every day | ORAL | Status: DC
  Administered 2016-06-11 – 2016-06-17 (×7): 150 mg via ORAL
  Filled 2016-06-11 (×7): qty 1

## 2016-06-11 MED ORDER — DEXTROSE 5 % IV SOLN
2.0000 g | INTRAVENOUS | Status: DC
  Administered 2016-06-12 – 2016-06-13 (×2): 2 g via INTRAVENOUS
  Filled 2016-06-11 (×3): qty 2

## 2016-06-11 MED ORDER — ALBUTEROL SULFATE (2.5 MG/3ML) 0.083% IN NEBU
5.0000 mg | INHALATION_SOLUTION | Freq: Once | RESPIRATORY_TRACT | Status: DC
  Filled 2016-06-11: qty 6

## 2016-06-11 MED ORDER — PRO-STAT SUGAR FREE PO LIQD
30.0000 mL | Freq: Two times a day (BID) | ORAL | Status: DC
  Administered 2016-06-11 – 2016-06-17 (×6): 30 mL via ORAL
  Filled 2016-06-11 (×11): qty 30

## 2016-06-11 MED ORDER — PANTOPRAZOLE SODIUM 40 MG PO TBEC
40.0000 mg | DELAYED_RELEASE_TABLET | Freq: Every day | ORAL | Status: DC
  Administered 2016-06-12 – 2016-06-17 (×5): 40 mg via ORAL
  Filled 2016-06-11 (×6): qty 1

## 2016-06-11 MED ORDER — CLOPIDOGREL BISULFATE 75 MG PO TABS
75.0000 mg | ORAL_TABLET | Freq: Every day | ORAL | Status: DC
  Administered 2016-06-11 – 2016-06-17 (×7): 75 mg via ORAL
  Filled 2016-06-11 (×7): qty 1

## 2016-06-11 MED ORDER — MUPIROCIN 2 % EX OINT
1.0000 "application " | TOPICAL_OINTMENT | Freq: Two times a day (BID) | CUTANEOUS | Status: AC
  Administered 2016-06-12 – 2016-06-16 (×10): 1 via NASAL
  Filled 2016-06-11 (×2): qty 22

## 2016-06-11 MED ORDER — ONDANSETRON HCL 4 MG PO TABS: 4.0000 mg | ORAL_TABLET | Freq: Four times a day (QID) | ORAL | Status: DC | PRN

## 2016-06-11 MED ORDER — SODIUM CHLORIDE 0.9 % IV BOLUS (SEPSIS)
500.0000 mL | Freq: Once | INTRAVENOUS | Status: AC
  Administered 2016-06-11: 500 mL via INTRAVENOUS

## 2016-06-11 MED ORDER — VANCOMYCIN HCL 500 MG IV SOLR
500.0000 mg | Freq: Two times a day (BID) | INTRAVENOUS | Status: DC
  Administered 2016-06-11 – 2016-06-17 (×12): 500 mg via INTRAVENOUS
  Filled 2016-06-11 (×16): qty 500

## 2016-06-11 MED ORDER — DEXTROSE 5 % IV SOLN
500.0000 mg | INTRAVENOUS | Status: DC
  Filled 2016-06-11: qty 500

## 2016-06-11 MED ORDER — TAMSULOSIN HCL 0.4 MG PO CAPS
0.4000 mg | ORAL_CAPSULE | Freq: Every day | ORAL | Status: DC
  Administered 2016-06-11 – 2016-06-17 (×7): 0.4 mg via ORAL
  Filled 2016-06-11 (×7): qty 1

## 2016-06-11 MED ORDER — OLOPATADINE HCL 0.1 % OP SOLN
1.0000 [drp] | Freq: Every day | OPHTHALMIC | Status: DC
  Administered 2016-06-11 – 2016-06-17 (×7): 1 [drp] via OPHTHALMIC
  Filled 2016-06-11 (×2): qty 5

## 2016-06-11 MED ORDER — GLUCERNA SHAKE PO LIQD
237.0000 mL | Freq: Two times a day (BID) | ORAL | Status: DC
  Administered 2016-06-12: 237 mL via ORAL

## 2016-06-11 MED ORDER — HYDROCODONE-ACETAMINOPHEN 5-325 MG PO TABS
1.0000 | ORAL_TABLET | ORAL | Status: DC | PRN
  Administered 2016-06-13: 1 via ORAL
  Filled 2016-06-11: qty 1

## 2016-06-11 MED ORDER — DOCUSATE SODIUM 100 MG PO CAPS
200.0000 mg | ORAL_CAPSULE | Freq: Three times a day (TID) | ORAL | Status: DC
  Administered 2016-06-12 – 2016-06-17 (×12): 200 mg via ORAL
  Filled 2016-06-11 (×14): qty 2

## 2016-06-11 MED ORDER — MECLIZINE HCL 25 MG PO TABS
25.0000 mg | ORAL_TABLET | Freq: Three times a day (TID) | ORAL | Status: DC
  Administered 2016-06-11 – 2016-06-17 (×14): 25 mg via ORAL
  Filled 2016-06-11 (×16): qty 1

## 2016-06-11 MED ORDER — METHYLPREDNISOLONE SODIUM SUCC 125 MG IJ SOLR
125.0000 mg | Freq: Once | INTRAMUSCULAR | Status: AC
  Administered 2016-06-11: 125 mg via INTRAVENOUS
  Filled 2016-06-11: qty 2

## 2016-06-11 MED ORDER — CHLORHEXIDINE GLUCONATE CLOTH 2 % EX PADS
6.0000 | MEDICATED_PAD | Freq: Every day | CUTANEOUS | Status: AC
  Administered 2016-06-12 – 2016-06-16 (×5): 6 via TOPICAL

## 2016-06-11 MED ORDER — DEXTROSE 5 % IV SOLN: 500.0000 mg | Freq: Once | INTRAVENOUS | Status: DC

## 2016-06-11 MED ORDER — IPRATROPIUM-ALBUTEROL 0.5-2.5 (3) MG/3ML IN SOLN: 3.0000 mL | Freq: Four times a day (QID) | RESPIRATORY_TRACT | Status: DC | PRN

## 2016-06-11 MED ORDER — MIRTAZAPINE 15 MG PO TABS
7.5000 mg | ORAL_TABLET | Freq: Every day | ORAL | Status: DC
  Administered 2016-06-11 – 2016-06-16 (×6): 7.5 mg via ORAL
  Filled 2016-06-11 (×6): qty 1

## 2016-06-11 MED ORDER — ENALAPRIL MALEATE 5 MG PO TABS
5.0000 mg | ORAL_TABLET | Freq: Every day | ORAL | Status: DC
  Administered 2016-06-11 – 2016-06-17 (×6): 5 mg via ORAL
  Filled 2016-06-11 (×7): qty 1

## 2016-06-11 MED ORDER — ENOXAPARIN SODIUM 40 MG/0.4ML ~~LOC~~ SOLN
40.0000 mg | SUBCUTANEOUS | Status: DC
  Administered 2016-06-11 – 2016-06-16 (×6): 40 mg via SUBCUTANEOUS
  Filled 2016-06-11 (×6): qty 0.4

## 2016-06-11 MED ORDER — ALBUTEROL SULFATE (2.5 MG/3ML) 0.083% IN NEBU
2.5000 mg | INHALATION_SOLUTION | Freq: Once | RESPIRATORY_TRACT | Status: AC
  Administered 2016-06-11: 2.5 mg via RESPIRATORY_TRACT
  Filled 2016-06-11: qty 3

## 2016-06-11 MED ORDER — SERTRALINE HCL 100 MG PO TABS
100.0000 mg | ORAL_TABLET | Freq: Every day | ORAL | Status: DC
  Administered 2016-06-11 – 2016-06-17 (×7): 100 mg via ORAL
  Filled 2016-06-11 (×7): qty 1

## 2016-06-11 NOTE — H&P (Signed)
History and Physical    Jeremiah Baldwin [id number] DOB: 1946/01/01 DOA: 06/11/2016   PCP: No primary care provider on file.   Patient coming from: Nursing Home   Chief Complaint: Shortness of Breath   HPI: Jeremiah Baldwin is a 70 y.o. male history of CAD, left BKA secondary to osteomyelitis, CVA with left-sided weakness brought to the ER for respiratory distress with hypoxia, all sats in the 70s, sinus tachycardia. The patient was placed on CPAP and IV fluids, with improvement in the ER. Family reports that he was already having trouble breathing over the last couple of days, worse on presentation. The patient is very frail, and the history is being obtained by family. He looks very malnourished. At the time of evaluation, he is not showing work of breathing. His blood pressure is now normal, and his heart rate is trending. Other information cannot be obtained, due to the patient's inability to engage in conversation.   ED Course:  BP 111/69   Pulse (!) 121   Temp 99.8 F (37.7 C) (Rectal)   Resp (!) 33   SpO2 96%    Bicarbonate on presentation 26  sodium 140 potassium 4.2  creatinine 1.03  BNP was 21.8 troponin 0.1  Lactic  acid 3.26 now 2.5.  White count 22.9. Hemoglobin 11.3, platelets 292. Chest x-ray showed left-sided consolidation versus aspiration.  Review of Systems: As per HPI otherwise 10 point review of systems negative.   Past Medical History:  Diagnosis Date  . Allergy   . Anorexia   . CAD (coronary artery disease)   . Colon polyp   . Diabetes mellitus without complication ([place])   . Diabetic retinopathy ([place])   . GERD (gastroesophageal reflux disease)   . Hyperlipidemia   . Hypertension   . Left hemiparesis ([place])   . PVD (peripheral vascular disease) ([place])   . Stroke [facility])     Past Surgical History:  Procedure Laterality Date  . enteroscopic laser photocoagulation Left 2001  . left bka      Social History Social History   Social History  . Marital  status: Married    Spouse name: N/A  . Number of children: N/A  . Years of education: N/A   Occupational History  . Not on file.   Social History Main Topics  . Smoking status: Never Smoker  . Smokeless tobacco: Never Used  . Alcohol use Not on file  . Drug use: Unknown  . Sexual activity: Not on file   Other Topics Concern  . Not on file   Social History Narrative  . No narrative on file     Allergies  Allergen Reactions  . Codeine Other (See Comments)    Listed on [facility] 06/11/16- unknown reaction    Family History  Problem Relation Age of Onset  . Diabetes Mother       Prior to Admission medications   Medication Sig Start Date End Date Taking? Authorizing Provider  buPROPion (WELLBUTRIN SR) 150 MG 12 hr tablet Take 150 mg by mouth daily. For depression   Yes Historical Provider, MD  clopidogrel (PLAVIX) 75 MG tablet Take 75 mg by mouth daily.   Yes Historical Provider, MD  enalapril (VASOTEC) 5 MG tablet Take 5 mg by mouth daily.   Yes Historical Provider, MD  Insulin Glargine (TOUJEO SOLOSTAR) 300 UNIT/ML SOPN Inject 10 Units into the skin at bedtime.    Yes Historical Provider, MD  ipratropium-albuterol (DUONEB) 0.5-2.5 (3) MG/3ML SOLN Take 3 mLs by  nebulization every 6 (six) hours as needed (shortness of breath/ congestion/ wheezing).    Yes Historical Provider, MD  mirtazapine (REMERON) 15 MG tablet Take 7.5 mg by mouth at bedtime.   Yes Historical Provider, MD  olopatadine (PATANOL) 0.1 % ophthalmic solution Place 1 drop into both eyes daily.    Yes Historical Provider, MD  ondansetron (ZOFRAN) 4 MG tablet Take 4 mg by mouth every 6 (six) hours as needed for nausea or vomiting.    Yes Historical Provider, MD  pantoprazole (PROTONIX) 40 MG tablet Take 40 mg by mouth daily at 6 (six) AM. For reflux   Yes Historical Provider, MD  sertraline (ZOLOFT) 100 MG tablet Take 100 mg by mouth daily.    Yes Historical Provider, MD  tamsulosin (FLOMAX) 0.4 MG CAPS capsule Take  0.4 mg by mouth daily.    Yes Historical Provider, MD  Amino Acids-Protein Hydrolys (FEEDING SUPPLEMENT, PRO-STAT SUGAR FREE 64,) LIQD Take 30 mLs by mouth 2 (two) times daily. Wound healing    Historical Provider, MD  docusate sodium (COLACE) 100 MG capsule Take 200 mg by mouth 3 (three) times daily.     Historical Provider, MD  feeding supplement, GLUCERNA SHAKE, (GLUCERNA SHAKE) LIQD Take 237 mLs by mouth 2 (two) times daily between meals. Between breakfast and lunch    Historical Provider, MD  meclizine (ANTIVERT) 25 MG tablet Take 25 mg by mouth 3 (three) times daily. For dizziness    Historical Provider, MD  metFORMIN (GLUCOPHAGE) 1000 MG tablet Take 1,000 mg by mouth 2 (two) times daily with a meal.    Historical Provider, MD    Physical Exam:    Vitals:   06/11/16 1515 06/11/16 1530 06/11/16 1545 06/11/16 1600  BP: 106/68 104/77 104/72 111/69  Pulse: (!) 121 118 118 (!) 121  Resp: (!) 29 (!) 35 (!) 33 (!) 33  Temp:      TempSrc:      SpO2: 98% 97% 97% 96%       Constitutional: NAD, calm,Appears comfortable, very frail   Vitals:   06/11/16 1515 06/11/16 1530 06/11/16 1545 06/11/16 1600  BP: 106/68 104/77 104/72 111/69  Pulse: (!) 121 118 118 (!) 121  Resp: (!) 29 (!) 35 (!) 33 (!) 33  Temp:      TempSrc:      SpO2: 98% 97% 97% 96%   Eyes: PERRL, lids and conjunctivae normal ENMT: Mucous membranes are dry. Posterior pharynx clear of any exudate or lesions.edentulous Neck: normal, supple, no masses, no thyromegaly Respiratory: coarse lung sounds , no wheezing, no crackles. Normal respiratory effort. No accessory muscle use.  Cardiovascular: Regular rate and rhythm, 2/6 murmurs / rubs / gallops. No extremity edema. 2+ pedal pulses. No carotid bruits.  Abdomen: no tenderness, no masses palpated. No hepatosplenomegaly. Bowel sounds positive.  Musculoskeletal: He is cachectic, with  severe muscle with severe muscle wasting  Skin: Scabs noted on his right lower extremity, he  is left BKA Neurologic: He is alert permanent contraction on LUE  Psychiatric: Normal judgment and insight. Alert and oriented x 3. Normal mood.     Labs on Admission: I have personally reviewed following labs and imaging studies  CBC:  Recent Labs Lab 06/11/16 1243  WBC 22.9*  NEUTROABS 19.3*  HGB 11.3*  HCT 35.6*  MCV 84.2  PLT 123456    Basic Metabolic Panel:  Recent Labs Lab 06/11/16 1243  NA 140  K 4.2  CL 104  CO2 23  GLUCOSE 290*  BUN 29*  CREATININE 1.03  CALCIUM 9.2    GFR: Estimated Creatinine Clearance: 49.8 mL/min (by C-G formula based on SCr of 1.03 mg/dL).  Liver Function Tests:  Recent Labs Lab 06/11/16 1243  AST 18  ALT 9*  ALKPHOS 97  BILITOT 0.8  PROT 6.7  ALBUMIN 3.1*   No results for input(s): LIPASE, AMYLASE in the last 168 hours. No results for input(s): AMMONIA in the last 168 hours.  Coagulation Profile: No results for input(s): INR, PROTIME in the last 168 hours.  Cardiac Enzymes:  Recent Labs Lab 06/11/16 1243  TROPONINI 0.10*    BNP (last 3 results) No results for input(s): PROBNP in the last 8760 hours.  HbA1C: No results for input(s): HGBA1C in the last 72 hours.  CBG: No results for input(s): GLUCAP in the last 168 hours.  Lipid Profile: No results for input(s): CHOL, HDL, LDLCALC, TRIG, CHOLHDL, LDLDIRECT in the last 72 hours.  Thyroid Function Tests: No results for input(s): TSH, T4TOTAL, FREET4, T3FREE, THYROIDAB in the last 72 hours.  Anemia Panel: No results for input(s): VITAMINB12, FOLATE, FERRITIN, TIBC, IRON, RETICCTPCT in the last 72 hours.  Urine analysis: No results found for: COLORURINE, APPEARANCEUR, LABSPEC, PHURINE, GLUCOSEU, HGBUR, BILIRUBINUR, KETONESUR, PROTEINUR, UROBILINOGEN, NITRITE, LEUKOCYTESUR  Sepsis Labs: @LABRCNTIP (procalcitonin:4,lacticidven:4) )No results found for this or any previous visit (from the past 240 hour(s)).   Radiological Exams on Admission: Dg Abdomen 1  View  Result Date: 06/11/2016 CLINICAL DATA:  70 year old male left-side-down lateral decubitus view for possible free air. Initial encounter. EXAM: ABDOMEN - 1 VIEW COMPARISON:  CT Abdomen and Pelvis 04/23/2016. FINDINGS: No pneumoperitoneum. Visualized bowel gas pattern is non obstructed. Negative visible right lung base. No acute osseous abnormality identified. IMPRESSION: Negative for free air.  Bowel gas pattern appears normal. Electronically Signed   By: Genevie Ann M.D.   On: 06/11/2016 16:32   Dg Chest Portable 1 View  Result Date: 06/11/2016 CLINICAL DATA:  Reason for exam: respiratory distress. Medical hx: diabetes, hypertension. Pt is paralyzed on left side; pt has ETT. EXAM: PORTABLE CHEST 1 VIEW COMPARISON:  04/24/2016 FINDINGS: Position degraded exam, with to patient rotated to the right and chin overlying the apex of the right upper lobe. Normal heart size. Moderate left hemidiaphragm elevation. No pleural effusion or pneumothorax. Improved left base aeration with mild volume loss remaining. No well-defined consolidation identified. Interval removal of right PICC line. IMPRESSION: Left hemidiaphragm elevation with improved left base aeration. No acute findings. Decreased sensitivity and specificity exam due to technique related factors, as described above. No endotracheal tube identified.  Correlate with clinical history. Electronically Signed   By: Abigail Miyamoto M.D.   On: 06/11/2016 12:49    EKG: Independently reviewed.  Assessment/Plan Active Problems:   Sepsis ([place])   Acute hypoxic respiratory failure, with suspected pneumonia versus aspiration pneumonia, rule out sepsis organism unknown WBC 22.9 , Lactic acid 2.58, tachycardic.   Antibiotics delivered in the ED. UA pending, afebrile.. Chest x-ray showed left-sided consolidation versus aspiration. Critical care medicine team this patient to be not a candidate for ICU, as he is DO NOT RESUSCITATE. Admit to SDU Sepsis order set  IV  antibiotics by pharmacy with cefepime, vancomycin and azithromycin for full coverage while awaiting cultures  Nebs  Follow lactic acid q 6 hrs Procalcitonin IV fluids      Type II Diabetes Current blood sugar level is 292 Lab Results  Component Value Date   HGBA1C 5.3 12/30/2015   Hold home  oral diabetic medications.  Lantus , SSI Heart healthy carb modified diet.  History of CVA with L sided residual weakness.  ON PLavix     Depression Continue home  Zoloft   Hypertension BP 111/69  Controlled Continue home anti-hypertensivetomorrow  Add Hydralazine Q6 hours as needed for BP 160/90    DVT prophylaxis: Lovenox   Code Status:  DNR Family Communication:  Discussed with patient Disposition Plan: Expect patient to be discharged to home after condition improves Consults called:    None Admission status: SDU    [facility] E, PA-C [facility]   06/11/2016, 4:57 PM

## 2016-06-11 NOTE — Progress Notes (Signed)
CRITICAL VALUE ALERT  Critical value received:  Lactic acid   Date of notification:  06/11/16  Time of notification: 2330  Critical value read back: Yes  Nurse who received alert:  Ashley Mariner   MD notified (1st page):  Schorr  Time of first page:  2350   MD notified (2nd page):  Time of second page:  Responding MD:  Schorr  Time MD responded:  0010

## 2016-06-11 NOTE — ED Notes (Signed)
Pt returned from x-ray. No complications. Pt breathing appears to be much improved.

## 2016-06-11 NOTE — Progress Notes (Signed)
CRITICAL VALUE ALERT  Critical value received:  Lactic Acid 2.7  Date of notification:  06/11/16  Time of notification:  1945  Critical value read back: yes  Nurse who received alert:  Thalia Party  MD notified (1st page):  Schorr  Time of first page:  1948  MD notified (2nd page):  Time of second page:  Responding MD:  Schorr  Time MD responded:  2005

## 2016-06-11 NOTE — ED Notes (Signed)
Assisted Brooke, RN with rectal temperature; cleaned patient's bottom for he had a small bowel movement; placed diaper back on patient; patient is now resting; family at bedside

## 2016-06-11 NOTE — Consult Note (Signed)
Name: Jeremiah Baldwin MRN: [id number] DOB: [birth year]    ADMISSION DATE:  06/11/2016 CONSULTATION DATE:  06/11/16  REFERRING MD :  EDP , Dr. Jeanell Sparrow   CHIEF COMPLAINT:  Hypoxia   BRIEF PATIENT DESCRIPTION: 70 yo NH male with hx of CAD, L. BKA secondary to osteomyelitis , CVA w/ left sided weakness brought to ER  For resp distress with hypoxia (O2 sats 70s)  and tachycardia. Placed on CPAP /IVF resusuciation  With improvement in ER.  PCCM consulted.   SIGNIFICANT EVENTS    STUDIES:   HISTORY OF PRESENT ILLNESS:   70 yo NH (since 2009 )  male with hx of CAD, L. BKA secondary to osteomyelitis , CVA w/ left sided weakness brought to ER  For resp distress with hypoxia (O2 sats 70s)  and tachycardia. Family says his breathing was getting worse for couple of days. he is very frail at [facility] and does not eat well. Looks very malnourished. He was hypoxic on arrival to ER with O2 sats in the 70s with tachycardia. He was placed on CPAP and IVF resusciatation . On arrival to ER he is doing well w/ adequate sats on nasal canula . WOB appears normal. Bp is normal . HR is trending . Family at bedside with updates. Discussion with wife , pt is DNR .    PAST MEDICAL HISTORY :   has a past medical history of Allergy; Anorexia; CAD (coronary artery disease); Colon polyp; Diabetes mellitus without complication ([place]); Diabetic retinopathy ([place]); GERD (gastroesophageal reflux disease); Hyperlipidemia; Hypertension; Left hemiparesis ([place]); PVD (peripheral vascular disease) ([place]); and Stroke ([place]).  has a past surgical history that includes left bka and enteroscopic laser photocoagulation (Left, 2001). Prior to Admission medications   Medication Sig Start Date End Date Taking? Authorizing Provider  Amino Acids-Protein Hydrolys (FEEDING SUPPLEMENT, PRO-STAT SUGAR FREE 64,) LIQD Take 30 mLs by mouth 2 (two) times daily. Wound healing    Historical Provider, MD  buPROPion (WELLBUTRIN SR) 150 MG 12 hr tablet Take 150 mg by  mouth daily. For depression    Historical Provider, MD  clopidogrel (PLAVIX) 75 MG tablet Take 75 mg by mouth daily.    Historical Provider, MD  docusate sodium (COLACE) 100 MG capsule Take 200 mg by mouth 3 (three) times daily.     Historical Provider, MD  enalapril (VASOTEC) 5 MG tablet Take 5 mg by mouth daily.    Historical Provider, MD  feeding supplement, GLUCERNA SHAKE, (GLUCERNA SHAKE) LIQD Take 237 mLs by mouth 2 (two) times daily between meals. Between breakfast and lunch    Historical Provider, MD  Insulin Glargine (TOUJEO SOLOSTAR) 300 UNIT/ML SOPN Inject 10 Units into the skin at bedtime.     Historical Provider, MD  ipratropium-albuterol (DUONEB) 0.5-2.5 (3) MG/3ML SOLN Take 3 mLs by nebulization every 6 (six) hours as needed. Sob, congestion/wheezing    Historical Provider, MD  meclizine (ANTIVERT) 25 MG tablet Take 25 mg by mouth 3 (three) times daily. For dizziness    Historical Provider, MD  metFORMIN (GLUCOPHAGE) 1000 MG tablet Take 1,000 mg by mouth 2 (two) times daily with a meal.    Historical Provider, MD  mirtazapine (REMERON) 15 MG tablet Take 7.5 mg by mouth at bedtime.    Historical Provider, MD  olopatadine (PATANOL) 0.1 % ophthalmic solution Place 1 drop into both eyes daily.     Historical Provider, MD  ondansetron (ZOFRAN) 4 MG tablet Take 4 mg by mouth every 6 (six) hours as needed  for nausea or vomiting.     Historical Provider, MD  pantoprazole (PROTONIX) 40 MG tablet Take 40 mg by mouth daily. For reflux    Historical Provider, MD  sertraline (ZOLOFT) 100 MG tablet Take 100 mg by mouth daily.     Historical Provider, MD  tamsulosin (FLOMAX) 0.4 MG CAPS capsule Take 0.4 mg by mouth at bedtime.     Historical Provider, MD   Allergies  Allergen Reactions  . Codeine     FAMILY HISTORY:  family history includes Diabetes in his mother. SOCIAL HISTORY:  reports that he has never smoked. He has never used smokeless tobacco.  REVIEW OF SYSTEMS:     Reviewed from  chart and family  Pt is non verbal     SUBJECTIVE:  Awake and alert and nods to questions but non verbal (chronic per wife)   VITAL SIGNS: Temp:  [99.8 F (37.7 C)] 99.8 F (37.7 C) (11/19 1332) Pulse Rate:  [117-144] 121 (11/19 1600) Resp:  [29-43] 33 (11/19 1600) BP: (90-113)/(61-77) 111/69 (11/19 1600) SpO2:  [90 %-100 %] 96 % (11/19 1600)  PHYSICAL EXAMINATION: General:  Very frail and malnourished male  Neuro:  Alert, nonverbal, nods appropriately , hand contracture on left HEENT: dry mucosa  Cardiovascular:  ST , 1/6 SM  Lungs:  Coarse BS  Abdomen: soft, hypoactive BS  Musculoskeletal:  Left BKA , RLE scaling /ulcerations  Skin:  Scaling /ulcerations of RLE    Recent Labs Lab 06/11/16 1243  NA 140  K 4.2  CL 104  CO2 23  BUN 29*  CREATININE 1.03  GLUCOSE 290*    Recent Labs Lab 06/11/16 1243  HGB 11.3*  HCT 35.6*  WBC 22.9*  PLT 292   Dg Chest Portable 1 View  Result Date: 06/11/2016 CLINICAL DATA:  Reason for exam: respiratory distress. Medical hx: diabetes, hypertension. Pt is paralyzed on left side; pt has ETT. EXAM: PORTABLE CHEST 1 VIEW COMPARISON:  04/24/2016 FINDINGS: Position degraded exam, with to patient rotated to the right and chin overlying the apex of the right upper lobe. Normal heart size. Moderate left hemidiaphragm elevation. No pleural effusion or pneumothorax. Improved left base aeration with mild volume loss remaining. No well-defined consolidation identified. Interval removal of right PICC line. IMPRESSION: Left hemidiaphragm elevation with improved left base aeration. No acute findings. Decreased sensitivity and specificity exam due to technique related factors, as described above. No endotracheal tube identified.  Correlate with clinical history. Electronically Signed   By: Abigail Miyamoto M.D.   On: 06/11/2016 12:49    ASSESSMENT / PLAN: 1. Acute Hypoxic Respiratory Failure w/ suspected HCAP /? Aspiration PNA  Improved with CPAP/O2    CXR shows left sided consolidation ?aspiration   Plan  Need KUB .  Would begin broad spectrum ABX  O2 to keep sats >90%.  Tr wbc /temp  Would pan culture     2. SIRS /Early Sepsis -suspect from HCAP  B/p stable  Suspect RLE is chronic -may need xray .   Plan  Tr lactate  Would begin broaden spectrum  IV abx  Would pan culture     Global : family updated at bedside , DNR .   Rexene Edison NP -C  Pulmonary and [place] Pager: [phone]  06/11/2016, 4:13 PM   STAFF NOTE: I, Merrie Roof, MD FACP have personally reviewed patient's available data, including medical history, events of note, physical examination and test results as part of my evaluation.  I have discussed with resident/NP and other care providers such as pharmacist, RN and RRT. In addition, I personally evaluated patient and elicited key findings of: cachectic severe, BKA left, chronic, foot drop changes, horribly malnurished, came in with acute resp failure, sepsis , tachy concerns, now responded well to fluids, now off NIMV, likley this was an asp event, there is air under diaphragm, consider KUB assessment, would administer empiric nosocomial abx, culture blood sputum, continued fluids, I perfromed sepsis repeat assessment, I updated wife, he has had failure to thrive and limited intake, I have had extensive discussions with family wife. We discussed patients current circumstances and organ failures. We also discussed patient's prior wishes under circumstances such as this. Family has decided to NOT perform resuscitation if arrest but to continue current medical support for now. NO pressors, no line, no heroics, no cpr, no intubation, medical management is acceptable, nimv is off, it may be futile and increase asp to re add this also, will sign off, admit to traid, edp to contact them   Lavon Paganini. Titus Mould, MD, [place] Pgr: [place] [facility] 06/11/2016  4:37 PM

## 2016-06-11 NOTE — ED Triage Notes (Signed)
[facility]- pt here from [facility] for resp distress. Pt initially 70% on room air. He improved to 90% on CPAP. HR 150, BP stable. 3 nitro administered.

## 2016-06-11 NOTE — ED Provider Notes (Addendum)
70 year old man from nursing home, history of coronary artery disease, diabetes, BKA secondary to osteomyelitis, CVA with left-sided weakness presents today with onset of severe dyspnea. Patient transported via EMS with report of her story distress. They report his sats were 70% on room air. They placed him on Cipro And his sats are 90%. He is tachycardic to 150 but reported normotension prior to hospital arrival. Patient was awake and alert on arrival. He was changed to nonrebreather. Oxygen saturations are in the upper 90s. HPI: Patient is 70 y.o. male who is being seen fro routine issues of HTN, GERD and anemia of chronic disease.       Past Medical History:  Diagnosis Date  . Allergy    . Anorexia    . CAD (coronary artery disease)    . Colon polyp    . Diabetes mellitus without complication ([place])    . Diabetic retinopathy ([place])    . GERD (gastroesophageal reflux disease)    . Hyperlipidemia    . Hypertension    . Left hemiparesis ([place])    . PVD (peripheral vascular disease) ([place])    . Stroke [facility])             Past Surgical     EKG Interpretation  Date/Time:  Sunday June 11 2016 12:12:26 EST Ventricular Rate:  145 PR Interval:    QRS Duration: 90 QT Interval:  292 QTC Calculation: [id number] Axis:   -50 Text Interpretation:  Sinus tachycardia Ventricular premature complex Left anterior fascicular block Abnormal R-wave progression, early transition Repolarization abnormality, prob rate related Confirmed by Keah Lamba MD, Andee Poles [id number]) on 06/11/2016 1:16:28 PM      Patient with severe dyspnea, tachycardia- iv fluids and abx ordered per sepsis protocol. HR decreased and bp 90-96 , dbp 61-71, continues on nrb with sats at 100%arterial blood gases 7.41/34/53. Patient does not wish intubation.  Discussed with patient , wife, and children and they agree with his wishes. Initial lactic elevated at 3.56.  No infiltrate seen on cxr. Given leukocytosis, elevated lactic likely infectious  etiology.  Patient with known osteo and very cachectic.  Will discuss with critical care.  1- respiratory distress 2- infection- respiratory vs osteo vs urine pending 3- elevated troponin- patient initially very tachycardiac and troponin leak may be secondary to increased rate 4-bs elevate at 290- doubt dka, likely c.w. Lactic acidosis.  CRITICAL CARE Performed by: Shaune Pollack Total critical care time: 60 minutes Critical care time was exclusive of separately billable procedures and treating other patients. Critical care was necessary to treat or prevent imminent or life-threatening deterioration. Critical care was time spent personally by me on the following activities: development of treatment plan with patient and/or surrogate as well as nursing, discussions with consultants, evaluation of patient's response to treatment, examination of patient, obtaining history from patient or surrogate, ordering and performing treatments and interventions, ordering and review of laboratory studies, ordering and review of radiographic studies, pulse oximetry and re-evaluation of patient's condition.  Discussed with Dr.Feinstein and he will see and evaluate.  Dr. Titus Mould saw and patient greatly improved  He is concerned for free air under left hemidiaphragm.  D.W Dr. Jasmine December who advises he thinks air is in bowel, however will get lateral decub to rule out free abdominal air.  Abdomen is soft and nttp.  Plan consult hospitalist for admission.  This patient meets SIRS Criteria and may be septic. SIRS = Systemic Inflammatory Response Syndrome  Best Practice Recommends:   Notify the  nurse immediately to increase monitoring of patient.   The recent clinical data is shown below. Vitals:   06/11/16 1515 06/11/16 1530 06/11/16 1545 06/11/16 1600  BP: 106/68 104/77 104/72 111/69  Pulse: (!) 121 118 118 (!) 121  Resp: (!) 29 (!) 35 (!) 33 (!) 33  Temp:      TempSrc:      SpO2: 98% 97% 97% 96%    Sepsis - Repeat Assessment  Performed at:    4:41 PM   Vitals     Blood pressure 111/69, pulse (!) 121, temperature 99.8 F (37.7 C), temperature source Rectal, resp. rate (!) 33, SpO2 96 %.  Heart:     Tachycardic  Lungs:    Rhonchi  Capillary Refill:   <2 sec  Peripheral Pulse:   Radial pulse palpable  Skin:     Pale     Pattricia Boss, MD 06/11/16 1448    Pattricia Boss, MD 06/11/16 1641

## 2016-06-11 NOTE — ED Provider Notes (Signed)
[facility] DEPT Provider Note   CSN: [id number] Arrival date & time: 06/11/16  1209     History   Chief Complaint Chief Complaint  Patient presents with  . Respiratory Distress    HPI Jeremiah Baldwin is a 70 y.o. male resident of [facility] with respiratory distress.  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  Pt alert and cooperative but not speaking during exam, on CPAP. Pt reports difficulty breathing, denies pain anywhere. Pt is DNR and does not want to be intubated.  HPI  Past Medical History:  Diagnosis Date  . Allergy   . Anorexia   . CAD (coronary artery disease)   . Colon polyp   . Diabetes mellitus without complication ([place])   . Diabetic retinopathy ([place])   . GERD (gastroesophageal reflux disease)   . Hyperlipidemia   . Hypertension   . Left hemiparesis ([place])   . PVD (peripheral vascular disease) ([place])   . Stroke [facility])     Patient Active Problem List   Diagnosis Date Noted  . Sepsis ([place]) 06/11/2016  . Anemia of chronic disease 06/04/2016  . Severe sepsis ([place]) 05/01/2016  . AKI (acute kidney injury) ([place]) 05/01/2016  . CAD (coronary artery disease) 05/01/2016  . Acute osteomyelitis of toe of right foot ([place]) 03/25/2016  . Leukocytosis 01/02/2016  . Anxiety state 07/02/2015  . Prostate cancer ([place]) 10/30/2014  . Fever presenting with conditions classified elsewhere 10/04/2014  . Acute urinary retention 09/16/2014  . Taking medication for chronic disease 09/07/2014  . Leukocytosis, unspecified 03/03/2014  . Peripheral vascular disease ([place]) 02/25/2014  . Conjunctivitis 02/25/2014  . Itching 10/23/2013  . Depression 04/17/2013  . Pruritic disorder 03/11/2013  . Urinary tract infection 02/27/2013  . Pure hypercholesterolemia 11/29/2012  . Type 1 diabetes mellitus with peripheral circulatory complications ([place]) [id number]  .  Hemiplegia or hemiparesis as late effect of cerebrovascular disease ([place]) 11/29/2012  . Hyperlipidemia LDL goal <70 11/11/2012  . Loss of weight 11/11/2012  . Essential hypertension, benign 11/11/2012  . Unspecified constipation 11/11/2012  . Type 2 diabetes with complication ([place]) [id number]  . Benign paroxysmal positional vertigo 11/11/2012  . CVA (cerebral vascular accident) ([place]) 11/11/2012  . Insomnia 11/11/2012  . GERD (gastroesophageal reflux disease) 11/11/2012    Past Surgical History:  Procedure Laterality Date  . enteroscopic laser photocoagulation Left 2001  . left bka         Home Medications    Prior to Admission medications   Medication Sig Start Date End Date Taking? Authorizing Provider  buPROPion (WELLBUTRIN SR) 150 MG 12 hr tablet Take 150 mg by mouth daily. For depression   Yes Historical Provider, MD  clopidogrel (PLAVIX) 75 MG tablet Take 75 mg by mouth daily.   Yes Historical Provider, MD  enalapril (VASOTEC) 5 MG tablet Take 5 mg by mouth daily.   Yes Historical Provider, MD  Insulin Glargine (TOUJEO SOLOSTAR) 300 UNIT/ML SOPN Inject 10 Units into the skin at bedtime.    Yes Historical Provider, MD  ipratropium-albuterol (DUONEB) 0.5-2.5 (3) MG/3ML SOLN Take 3 mLs by nebulization every 6 (six) hours as needed (shortness of breath/ congestion/ wheezing).    Yes Historical Provider, MD  mirtazapine (REMERON) 15 MG tablet Take 7.5 mg by mouth at bedtime.   Yes Historical Provider, MD  olopatadine (PATANOL) 0.1 % ophthalmic solution Place 1 drop into both eyes  daily.    Yes Historical Provider, MD  ondansetron (ZOFRAN) 4 MG tablet Take 4 mg by mouth every 6 (six) hours as needed for nausea or vomiting.    Yes Historical Provider, MD  pantoprazole (PROTONIX) 40 MG tablet Take 40 mg by mouth daily at 6 (six) AM. For reflux   Yes Historical Provider, MD  sertraline (ZOLOFT) 100 MG tablet Take 100 mg by mouth daily.    Yes Historical Provider, MD  tamsulosin  (FLOMAX) 0.4 MG CAPS capsule Take 0.4 mg by mouth daily.    Yes Historical Provider, MD  Amino Acids-Protein Hydrolys (FEEDING SUPPLEMENT, PRO-STAT SUGAR FREE 64,) LIQD Take 30 mLs by mouth 2 (two) times daily. Wound healing    Historical Provider, MD  docusate sodium (COLACE) 100 MG capsule Take 200 mg by mouth 3 (three) times daily.     Historical Provider, MD  feeding supplement, GLUCERNA SHAKE, (GLUCERNA SHAKE) LIQD Take 237 mLs by mouth 2 (two) times daily between meals. Between breakfast and lunch    Historical Provider, MD  meclizine (ANTIVERT) 25 MG tablet Take 25 mg by mouth 3 (three) times daily. For dizziness    Historical Provider, MD  metFORMIN (GLUCOPHAGE) 1000 MG tablet Take 1,000 mg by mouth 2 (two) times daily with a meal.    Historical Provider, MD    Family History Family History  Problem Relation Age of Onset  . Diabetes Mother     Social History Social History  Substance Use Topics  . Smoking status: Never Smoker  . Smokeless tobacco: Never Used  . Alcohol use Not on file     Allergies   Codeine   Review of Systems Review of Systems  Constitutional: Negative for fatigue and fever.  HENT: Negative for sore throat.   Eyes: Negative for visual disturbance.  Respiratory: Positive for chest tightness and shortness of breath.   Cardiovascular: Positive for chest pain.  Gastrointestinal: Negative for abdominal pain, diarrhea and vomiting.  Genitourinary: Positive for difficulty urinating.  Musculoskeletal: Negative for back pain.  Skin: Negative for rash.  Neurological: Negative for headaches.  Psychiatric/Behavioral: Negative.      Physical Exam Updated Vital Signs BP 111/69   Pulse (!) 121   Temp 99.8 F (37.7 C) (Rectal)   Resp (!) 33   SpO2 96%   Physical Exam  Constitutional: He is oriented to person, place, and time.  Cachectic appearance, [facility]S on CPAP.  Pt alert responding to questions with head nods and shakes.  Pt greets me with an eye  wink.    HENT:  Head: Normocephalic and atraumatic.  Right Ear: External ear normal.  Left Ear: External ear normal.  Nose: Nose normal.  Eyes: EOM are normal. Pupils are equal, round, and reactive to light.  Neck: Neck supple.  Cardiovascular: Normal rate, regular rhythm and intact distal pulses.   Pulmonary/Chest: He has no wheezes.  Increase breathing effort.  O2 sat 70% prior to arrival and prior to CPAP, per EMS.  Rhonchi on R anterior and middle lobe, decreased breath sounds at R lower lobe.  L lung clear to ascultation.    Abdominal: Soft. He exhibits no distension. There is no tenderness. There is no guarding.  Musculoskeletal:  Obvious L hemiparesis.  CN 2-10 intact bilaterally.  5/5 strength in R upper and lower extremities.    Lymphadenopathy:    He has no cervical adenopathy.  Neurological: He is alert and oriented to person, place, and time.  Skin: Skin is warm. Capillary  refill takes less than 2 seconds. He is not diaphoretic.  Dry scaly skin in lower R leg and over R foot.   Darkened   Psychiatric: He has a normal mood and affect.     ED Treatments / Results  Labs (all labs ordered are listed, but only abnormal results are displayed) Labs Reviewed  TROPONIN I - Abnormal; Notable for the following:       Result Value   Troponin I 0.10 (*)    All other components within normal limits  CBC WITH DIFFERENTIAL/PLATELET - Abnormal; Notable for the following:    WBC 22.9 (*)    Hemoglobin 11.3 (*)    HCT 35.6 (*)    RDW 16.4 (*)    Neutro Abs 19.3 (*)    Monocytes Absolute 1.7 (*)    All other components within normal limits  COMPREHENSIVE METABOLIC PANEL - Abnormal; Notable for the following:    Glucose, Bld 290 (*)    BUN 29 (*)    Albumin 3.1 (*)    ALT 9 (*)    All other components within normal limits  BRAIN NATRIURETIC PEPTIDE - Abnormal; Notable for the following:    B Natriuretic Peptide 221.8 (*)    All other components within normal limits  I-STAT  ARTERIAL BLOOD GAS, ED - Abnormal; Notable for the following:    pO2, Arterial 53.0 (*)    All other components within normal limits  I-STAT CG4 LACTIC ACID, ED - Abnormal; Notable for the following:    Lactic Acid, Venous 3.56 (*)    All other components within normal limits  I-STAT VENOUS BLOOD GAS, ED - Abnormal; Notable for the following:    pCO2, Ven 43.0 (*)    All other components within normal limits  I-STAT CG4 LACTIC ACID, ED - Abnormal; Notable for the following:    Lactic Acid, Venous 2.58 (*)    All other components within normal limits  CULTURE, BLOOD (ROUTINE X 2)  CULTURE, BLOOD (ROUTINE X 2)  URINE CULTURE  CULTURE, EXPECTORATED SPUTUM-ASSESSMENT  BLOOD GAS, VENOUS  URINALYSIS, ROUTINE W REFLEX MICROSCOPIC (NOT AT [facility])  TROPONIN I  CBC WITH DIFFERENTIAL/PLATELET  COMPREHENSIVE METABOLIC PANEL  LACTIC ACID, PLASMA  LACTIC ACID, PLASMA  PROCALCITONIN  PROTIME-INR  APTT  INFLUENZA PANEL BY PCR (TYPE A & B, H1N1)    EKG  EKG Interpretation  Date/Time:  Sunday June 11 2016 12:12:26 EST Ventricular Rate:  145 PR Interval:    QRS Duration: 90 QT Interval:  292 QTC Calculation: 454 R Axis:   -50 Text Interpretation:  Sinus tachycardia Ventricular premature complex Left anterior fascicular block Abnormal R-wave progression, early transition Repolarization abnormality, prob rate related Confirmed by RAY MD, Andee Poles [phone]) on 06/11/2016 1:16:28 PM       Radiology Dg Abdomen 1 View  Result Date: 06/11/2016 CLINICAL DATA:  70 year old male left-side-down lateral decubitus view for possible free air. Initial encounter. EXAM: ABDOMEN - 1 VIEW COMPARISON:  CT Abdomen and Pelvis 04/23/2016. FINDINGS: No pneumoperitoneum. Visualized bowel gas pattern is non obstructed. Negative visible right lung base. No acute osseous abnormality identified. IMPRESSION: Negative for free air.  Bowel gas pattern appears normal. Electronically Signed   By: Genevie Ann M.D.   On:  06/11/2016 16:32   Dg Chest Portable 1 View  Result Date: 06/11/2016 CLINICAL DATA:  Reason for exam: respiratory distress. Medical hx: diabetes, hypertension. Pt is paralyzed on left side; pt has ETT. EXAM: PORTABLE CHEST 1 VIEW COMPARISON:  04/24/2016  FINDINGS: Position degraded exam, with to patient rotated to the right and chin overlying the apex of the right upper lobe. Normal heart size. Moderate left hemidiaphragm elevation. No pleural effusion or pneumothorax. Improved left base aeration with mild volume loss remaining. No well-defined consolidation identified. Interval removal of right PICC line. IMPRESSION: Left hemidiaphragm elevation with improved left base aeration. No acute findings. Decreased sensitivity and specificity exam due to technique related factors, as described above. No endotracheal tube identified.  Correlate with clinical history. Electronically Signed   By: Abigail Miyamoto M.D.   On: 06/11/2016 12:49    Procedures Procedures (including critical care time)  Medications Ordered in ED Medications  albuterol (PROVENTIL) (2.5 MG/3ML) 0.083% nebulizer solution 5 mg (not administered)  feeding supplement (PRO-STAT SUGAR FREE 64) liquid 30 mL (not administered)  ipratropium-albuterol (DUONEB) 0.5-2.5 (3) MG/3ML nebulizer solution 3 mL (not administered)  feeding supplement (GLUCERNA SHAKE) (GLUCERNA SHAKE) liquid 237 mL (not administered)  mirtazapine (REMERON) tablet 7.5 mg (not administered)  ondansetron (ZOFRAN) tablet 4 mg (not administered)  buPROPion (WELLBUTRIN SR) 12 hr tablet 150 mg (not administered)  sertraline (ZOLOFT) tablet 100 mg (not administered)  tamsulosin (FLOMAX) capsule 0.4 mg (not administered)  olopatadine (PATANOL) 0.1 % ophthalmic solution 1 drop (not administered)  clopidogrel (PLAVIX) tablet 75 mg (not administered)  docusate sodium (COLACE) capsule 200 mg (not administered)  enalapril (VASOTEC) tablet 5 mg (not administered)  meclizine  (ANTIVERT) tablet 25 mg (not administered)  pantoprazole (PROTONIX) EC tablet 40 mg (not administered)  HYDROcodone-acetaminophen (NORCO/VICODIN) 5-325 MG per tablet 1-2 tablet (not administered)  enoxaparin (LOVENOX) injection 40 mg (not administered)  azithromycin (ZITHROMAX) 500 mg in dextrose 5 % 250 mL IVPB (not administered)  albuterol (PROVENTIL) (2.5 MG/3ML) 0.083% nebulizer solution 2.5 mg (2.5 mg Nebulization Given 06/11/16 1422)  methylPREDNISolone sodium succinate (SOLU-MEDROL) 125 mg/2 mL injection 125 mg (125 mg Intravenous Given 06/11/16 1507)     Initial Impression / Assessment and Plan / ED Course  I have reviewed the triage vital signs and the nursing notes.  Pertinent labs & imaging results that were available during my care of the patient were reviewed by me and considered in my medical decision making (see chart for details).  Clinical Course as of Jun 11 1657  Nancy Fetter Jun 11, 2016  1636 EKG 12-Lead [CG]  1637 EKG 12-Lead [CG]    Clinical Course User Index [CG] Kinnie Feil, PA-C   70 yo male with extensive pmh as noted above is BBEMS with respiratory distress. Initial O2 sat en route was 70% on RA prior to CPAP, per EMS.  On exam pt is awake, alert and oriented to place.  Responds to questions with head nods/shakes.  Pt denies pain currently.  On exam pt is afebrile, tachycardic in 120-140s range, tachypnic 33-40s with soft SBPs in the 90-110s.  RRR, intact right DP and radial pulses.  Rhonchi in R upper and middle lobes only other lobes clear.  Abdomen soft, non tender, no rebound.  Pt with foley, urine is dark without gross hematuria.  R foot with scaly, dry skin with areas of dark blackened thickened skin c/w chronic osteomyelitis of R foot. ED work up focused around possible infectious etiology causing SIRS/sepsis.  CBC remarkable for leukocytosis with WBC 22.9 and anemia with Hgb 11.6.  Initial lactic acid 3.56, repeat 2.58.  ABGs remarkable for arterial O2 53,  otherwise normal.  Portable chest x-ray remarkable for L hemidiaphragm elevation, no pleural effusion or consolidation identified.  Abdominal x-ray negative for free air. Bowel gas pattern appears normal.  CMP remarkable for hyperglycemia and elevated BUN, creatinine normal.  EKG negative for acute changes from previous with elevated troponin at 0.1 and BNP elevated at 221.8, pending repeat troponin.   Pt breathing improving in ED, now on nasal canula. Pt received albuterol x 2, methylpred x 1 in ED. Pt to be admitted to SDU for further sepsis work up, IVFs and likely start broad abx.  Final Clinical Impressions(s) / ED Diagnoses   Final diagnoses:  Sepsis [facility])    New Prescriptions New Prescriptions   No medications on file     Kinnie Feil, PA-C 06/11/16 1659    Pattricia Boss, MD 06/19/16 1840

## 2016-06-11 NOTE — ED Notes (Signed)
Patient placed on monitor, continuous pulse oximetry and blood pressure cuff; NRB oxygen (15L) applied by RT

## 2016-06-11 NOTE — Progress Notes (Signed)
Pharmacy Antibiotic Note  Jionni Sanders is a 70 y.o. male admitted on 06/11/2016 with pneumonia and sepsis.  Pharmacy has been consulted for Vancomycin, Cefepime, and Azithromycin dosing.  He has one dose of Azithromycin ordered but no other antibiotics that I see at this time.  His creatinine is elevated from his baseline of 0.7.  Plan: Vancomycin 500mg  IV q12h Cefepime 2g IV q24h Azithromycin 500mg  IV q24h Follow renal function Follow micro data     Temp (24hrs), Avg:99.8 F (37.7 C), Min:99.8 F (37.7 C), Max:99.8 F (37.7 C)   Recent Labs Lab 06/11/16 1243 06/11/16 1251 06/11/16 1642  WBC 22.9*  --   --   CREATININE 1.03  --   --   LATICACIDVEN  --  3.56* 2.58*    Estimated Creatinine Clearance: 49.8 mL/min (by C-G formula based on SCr of 1.03 mg/dL).    Allergies  Allergen Reactions  . Codeine Other (See Comments)    Listed on [facility] 06/11/16- unknown reaction    Thank you for allowing pharmacy to be a part of this patient's care.  Legrand Como, Pharm.D., BCPS, AAHIVP Clinical Pharmacist Phone: [phone] or [phone] 06/11/2016, 5:07 PM

## 2016-06-11 NOTE — ED Notes (Signed)
Pt placed back on NRB mask @ 15L/min due to blood gas results.

## 2016-06-12 ENCOUNTER — Inpatient Hospital Stay (HOSPITAL_COMMUNITY): Payer: Medicare Other

## 2016-06-12 DIAGNOSIS — E118 Type 2 diabetes mellitus with unspecified complications: Secondary | ICD-10-CM

## 2016-06-12 DIAGNOSIS — Z8673 Personal history of transient ischemic attack (TIA), and cerebral infarction without residual deficits: Secondary | ICD-10-CM

## 2016-06-12 DIAGNOSIS — L8990 Pressure ulcer of unspecified site, unspecified stage: Secondary | ICD-10-CM | POA: Insufficient documentation

## 2016-06-12 LAB — BLOOD CULTURE ID PANEL (REFLEXED)
Acinetobacter baumannii: NOT DETECTED
CANDIDA ALBICANS: NOT DETECTED
CANDIDA GLABRATA: NOT DETECTED
CANDIDA KRUSEI: NOT DETECTED
Candida parapsilosis: NOT DETECTED
Candida tropicalis: NOT DETECTED
ENTEROBACTER CLOACAE COMPLEX: NOT DETECTED
ENTEROCOCCUS SPECIES: NOT DETECTED
ESCHERICHIA COLI: NOT DETECTED
Enterobacteriaceae species: NOT DETECTED
Haemophilus influenzae: NOT DETECTED
Klebsiella oxytoca: NOT DETECTED
Klebsiella pneumoniae: NOT DETECTED
LISTERIA MONOCYTOGENES: NOT DETECTED
Methicillin resistance: DETECTED — AB
Neisseria meningitidis: NOT DETECTED
PROTEUS SPECIES: NOT DETECTED
PSEUDOMONAS AERUGINOSA: NOT DETECTED
STAPHYLOCOCCUS SPECIES: DETECTED — AB
STREPTOCOCCUS AGALACTIAE: NOT DETECTED
STREPTOCOCCUS PNEUMONIAE: NOT DETECTED
STREPTOCOCCUS PYOGENES: NOT DETECTED
Serratia marcescens: NOT DETECTED
Staphylococcus aureus (BCID): NOT DETECTED
Streptococcus species: NOT DETECTED

## 2016-06-12 LAB — GLUCOSE, CAPILLARY: GLUCOSE-CAPILLARY: 232 mg/dL — AB (ref 65–99)

## 2016-06-12 LAB — URINE MICROSCOPIC-ADD ON: SQUAMOUS EPITHELIAL / LPF: NONE SEEN

## 2016-06-12 LAB — URINALYSIS, ROUTINE W REFLEX MICROSCOPIC
Bilirubin Urine: NEGATIVE
Glucose, UA: 250 mg/dL — AB
Ketones, ur: NEGATIVE mg/dL
NITRITE: NEGATIVE
Protein, ur: NEGATIVE mg/dL
SPECIFIC GRAVITY, URINE: 1.018 (ref 1.005–1.030)
pH: 5.5 (ref 5.0–8.0)

## 2016-06-12 LAB — LACTIC ACID, PLASMA: Lactic Acid, Venous: 1.8 mmol/L (ref 0.5–1.9)

## 2016-06-12 MED ORDER — SODIUM CHLORIDE 0.9 % IV BOLUS (SEPSIS)
500.0000 mL | Freq: Once | INTRAVENOUS | Status: AC
  Administered 2016-06-12: 500 mL via INTRAVENOUS

## 2016-06-12 MED ORDER — DEXTROSE 5 % IV SOLN
500.0000 mg | INTRAVENOUS | Status: DC
  Administered 2016-06-12 – 2016-06-15 (×4): 500 mg via INTRAVENOUS
  Filled 2016-06-12 (×5): qty 500

## 2016-06-12 MED ORDER — RESOURCE THICKENUP CLEAR PO POWD
Freq: Once | ORAL | Status: AC
  Administered 2016-06-12: 13:00:00 via ORAL
  Filled 2016-06-12: qty 125

## 2016-06-12 MED ORDER — ORAL CARE MOUTH RINSE
15.0000 mL | Freq: Two times a day (BID) | OROMUCOSAL | Status: DC
  Administered 2016-06-12 – 2016-06-17 (×5): 15 mL via OROMUCOSAL

## 2016-06-12 MED ORDER — ACETAMINOPHEN 325 MG PO TABS: 650.0000 mg | ORAL_TABLET | Freq: Four times a day (QID) | ORAL | Status: DC | PRN

## 2016-06-12 NOTE — Evaluation (Signed)
Physical Therapy Evaluation and Discharge Patient Details Name: Jeremiah Baldwin MRN: [id number] DOB: 16-Nov-1945 Today's Date: 06/12/2016   History of Present Illness  pt rpesents with PNA and Sepsis.  pt with hx of CVA, L BKA, CAD, Anorexia, DM, Retinopathy, HTN, and PVD.    Clinical Impression  Pt at this time total care for all mobility and per pt the staff at the SNF utilize on [place] for any out of bed time.  Feel pt is appropriate for return to SNF once appropriate and has no further acute PT needs at this time.  Will sign off.      Follow Up Recommendations SNF    Equipment Recommendations  None recommended by PT    Recommendations for Other Services       Precautions / Restrictions Precautions Precautions: Fall Restrictions Weight Bearing Restrictions: No      Mobility  Bed Mobility Overal bed mobility: Needs Assistance;+2 for physical assistance Bed Mobility: Supine to Sit;Sit to Supine     Supine to sit: Total assist;+2 for physical assistance Sit to supine: Total assist;+2 for physical assistance   General bed mobility comments: pt does not A with bed mobility.  pt difficult to place in seated position due to truncal tone.  pt with questionable decreased hamstring length affecting positioning.    Transfers                    Ambulation/Gait                Stairs            Wheelchair Mobility    Modified Rankin (Stroke Patients Only)       Balance Overall balance assessment: Needs assistance Sitting-balance support: Feet unsupported;No upper extremity supported Sitting balance-Leahy Scale: Zero Sitting balance - Comments: Unable to get pt's R foot on the floor due to decreased ROM and possible contractures.                                       Pertinent Vitals/Pain Pain Assessment: No/denies pain    Home Living Family/patient expects to be discharged to:: Skilled nursing facility                       Prior Function Level of Independence: Needs assistance   Gait / Transfers Assistance Needed: pt uses [place] lift for transfers.  ADL's / Homemaking Assistance Needed: SNF staff perform all ADLs.          Hand Dominance        Extremity/Trunk Assessment   Upper Extremity Assessment: Generalized weakness;LUE deficits/detail       LUE Deficits / Details: L UE contracted from old CVA.  Maintains a flexed position.     Lower Extremity Assessment: Generalized weakness;RLE deficits/detail;LLE deficits/detail RLE Deficits / Details: pt with plantar flexion contracture with pt foot ~40 degrees from neutral and supinated.  Knee and hip with decreased ROM and at high risk for contractures.  Sensation seems intact.   LLE Deficits / Details: pt with old BKA.  No active movement noted due to old CVA.    Cervical / Trunk Assessment: Kyphotic  Communication   Communication:  (Soft spoken)  Cognition Arousal/Alertness: Awake/alert Behavior During Therapy: Flat affect Overall Cognitive Status: No family/caregiver present to determine baseline cognitive functioning  General Comments      Exercises     Assessment/Plan    PT Assessment Patent does not need any further PT services  PT Problem List            PT Treatment Interventions      PT Goals (Current goals can be found in the Care Plan section)  Acute Rehab PT Goals Patient Stated Goal: None stated PT Goal Formulation: All assessment and education complete, DC therapy    Frequency     Barriers to discharge        Co-evaluation               End of Session   Activity Tolerance: Patient tolerated treatment well Patient left: in bed;with call bell/phone within reach Nurse Communication: Mobility status;Need for lift equipment         Time: [phone] PT Time Calculation (min) (ACUTE ONLY): 15 min   Charges:   PT Evaluation $PT Eval Moderate Complexity: 1 Procedure      PT G CodesThornton Baldwin Jeremiah Baldwin, PT  [phone] 06/12/2016, 11:31 AM

## 2016-06-12 NOTE — Progress Notes (Signed)
PHARMACY - PHYSICIAN COMMUNICATION CRITICAL VALUE ALERT - BLOOD CULTURE IDENTIFICATION (BCID)  Results for orders placed or performed during the hospital encounter of 06/11/16  Blood Culture ID Panel (Reflexed) (Collected: 06/11/2016 12:15 PM)  Result Value Ref Range   Enterococcus species NOT DETECTED NOT DETECTED   Listeria monocytogenes NOT DETECTED NOT DETECTED   Staphylococcus species DETECTED (A) NOT DETECTED   Staphylococcus aureus NOT DETECTED NOT DETECTED   Methicillin resistance DETECTED (A) NOT DETECTED   Streptococcus species NOT DETECTED NOT DETECTED   Streptococcus agalactiae NOT DETECTED NOT DETECTED   Streptococcus pneumoniae NOT DETECTED NOT DETECTED   Streptococcus pyogenes NOT DETECTED NOT DETECTED   Acinetobacter baumannii NOT DETECTED NOT DETECTED   Enterobacteriaceae species NOT DETECTED NOT DETECTED   Enterobacter cloacae complex NOT DETECTED NOT DETECTED   Escherichia coli NOT DETECTED NOT DETECTED   Klebsiella oxytoca NOT DETECTED NOT DETECTED   Klebsiella pneumoniae NOT DETECTED NOT DETECTED   Proteus species NOT DETECTED NOT DETECTED   Serratia marcescens NOT DETECTED NOT DETECTED   Haemophilus influenzae NOT DETECTED NOT DETECTED   Neisseria meningitidis NOT DETECTED NOT DETECTED   Pseudomonas aeruginosa NOT DETECTED NOT DETECTED   Candida albicans NOT DETECTED NOT DETECTED   Candida glabrata NOT DETECTED NOT DETECTED   Candida krusei NOT DETECTED NOT DETECTED   Candida parapsilosis NOT DETECTED NOT DETECTED   Candida tropicalis NOT DETECTED NOT DETECTED    Name of physician (or Provider) Contacted: Dr. Ree Kida  Changes to prescribed antibiotics required: Patient is already on vancomycin. Continue at this time.   Belia Heman, PharmD PGY1 Pharmacy Resident [phone] (Pager) 06/12/2016 1:37 PM

## 2016-06-12 NOTE — Progress Notes (Signed)
Inpatient Diabetes Program Recommendations  AACE/ADA: New Consensus Statement on Inpatient Glycemic Control (2015)  Target Ranges:  Prepandial:   less than 140 mg/dL      Peak postprandial:   less than 180 mg/dL (1-2 hours)      Critically ill patients:  140 - 180 mg/dL   Lab Results  Component Value Date   HGBA1C 5.3 12/30/2015   Review of Glycemic Control  Diabetes history: DM 2 Outpatient Diabetes medications: Lantus 10, Metformin 1000 mg BID Current orders for Inpatient glycemic control: None  Inpatient Diabetes Program Recommendations:   Glucose mid to high 200's. Please consider ordering half of patient's home dose of basal insulin, Lantus 5 units and Novolog Sensitive TID + HS scale.  Thanks,  Tama Headings RN, MSN, [facility] Inpatient Diabetes Coordinator Team Pager [phone] (8a-5p)

## 2016-06-12 NOTE — Progress Notes (Signed)
Initial Nutrition Assessment  DOCUMENTATION CODES:   Severe malnutrition in context of chronic illness, Underweight  INTERVENTION:    D/C Glucerna Shakes, patient refuses to drink.   Add Mighty Shake II with meals, each supplement provides 480-500 kcals and 20-23 grams of protein   Add Magic cup with meals, each supplement provides 290 kcal and 9 grams of protein  NUTRITION DIAGNOSIS:   Malnutrition related to chronic illness as evidenced by severe depletion of muscle mass, severe depletion of body fat.  GOAL:   Patient will meet greater than or equal to 90% of their needs  MONITOR:   PO intake, Supplement acceptance, Labs, Weight trends, I & O's, Skin  REASON FOR ASSESSMENT:   Low Braden    ASSESSMENT:   70 y.o. male history of CAD, left BKA secondary to osteomyelitis, CVA with left-sided weakness brought to the ER for respiratory distress with hypoxia, all sats in the 70s, sinus tachycardia. The patient was placed on CPAP and IV fluids, with improvement in the ER.  Patient and his wife report that he's had a poor appetite and poor intake for several months. He does not like Ensure, Boost, Glucerna Shake or similar supplements. He has had Magic Cup in the past and agreed to receive with meals along with Mighty Shake II supplements. He has lost 14% of usual weight in the past month. Nutrition-Focused physical exam completed. Findings are severe fat depletion, severe muscle depletion, and no edema.  Patient with severe PCM.  Diet Order:  DIET DYS 3 Room service appropriate? Yes; Fluid consistency: Nectar Thick  Skin:  Wound (see comment) (DTI to sacrum)  Last BM:  11/19  Height:   Ht Readings from Last 1 Encounters:  06/11/16 6\' 2"  (1.88 m)    Weight:   Wt Readings from Last 1 Encounters:  06/11/16 107 lb 5.8 oz (48.7 kg)    Ideal Body Weight:  79.5 kg  BMI:  14.98 (using adjusted weight for AKA)  Estimated Nutritional Needs:   Kcal:   1600-1800  Protein:  75-85 gm  Fluid:  1.8 L  EDUCATION NEEDS:   No education needs identified at this time  Molli Barrows, [place], [place], [place] Pager [phone] After Hours Pager [phone]

## 2016-06-12 NOTE — Progress Notes (Signed)
PROGRESS NOTE    [name]  [id number] DOB: [birth year] DOA: 06/11/2016 PCP: No primary care provider on file.   Chief Complaint  Patient presents with  . Respiratory Distress     Brief Narrative:  HPI on 06/11/2016 by Ms. Sharene Butters, PA Jeremiah Baldwin is a 70 y.o. male history of CAD, left BKA secondary to osteomyelitis, CVA with left-sided weakness brought to the ER for respiratory distress with hypoxia, all sats in the 70s, sinus tachycardia. The patient was placed on CPAP and IV fluids, with improvement in the ER. Family reports that he was already having trouble breathing over the last couple of days, worse on presentation. The patient is very frail, and the history is being obtained by family. He looks very malnourished. At the time of evaluation, he is not showing work of breathing. His blood pressure is now normal, and his heart rate is trending. Other information cannot be obtained, due to the patient's inability to engage in conversation.  Assessment & Plan   Acute hypoxic respiratory failure -Upon admission, oxygen saturations are to be in the 70s -Possibly secondary to pneumonia -Continue supplemental oxygen to maintain saturations above 92% -Continue neb treatments  Sepsis secondary to pneumonia -Upon admission, patient was noted to be tachycardic, tachypneic, with leukocytosis and hypotension. Actiq acid is up 3.4 -Left lower lobe atelectasis or pneumonia -Continue vancomycin, cefepime, azithromycin -Influenza PCR negative -Blood cultures pending  Diabetes mellitus, type II -Metformin held, continue insulin sliding scale CBG monitoring  History of of CVA with left-sided residual weakness -Continue Plavix -?no statin  Depression -Continue Zoloft, Wellbutrin  GERD  -Continue PPI  ?dysphagia vs aspiration -Monitored patient while he was eating breakfast this morning, seems to hold onto food in his mouth. -Speech consulted and appreciated  DVT  Prophylaxis  Lovenox  Code Status: DO NOT RESUSCITATE  Family Communication: None bedside  Disposition Plan: Admitted. Continue to monitor and stepdown. Discharge to SNF when stable.  Consultants PCCM  Procedures  None  Antibiotics   Anti-infectives    Start     Dose/Rate Route Frequency Ordered Stop   06/12/16 1700  azithromycin (ZITHROMAX) 500 mg in dextrose 5 % 250 mL IVPB     500 mg 250 mL/hr over 60 Minutes Intravenous Every 24 hours 06/11/16 1709     06/11/16 1800  vancomycin (VANCOCIN) 500 mg in sodium chloride 0.9 % 100 mL IVPB     500 mg 100 mL/hr over 60 Minutes Intravenous Every 12 hours 06/11/16 1708     06/11/16 1730  ceFEPIme (MAXIPIME) 2 g in dextrose 5 % 50 mL IVPB     2 g 100 mL/hr over 30 Minutes Intravenous Every 24 hours 06/11/16 1708     06/11/16 1700  azithromycin (ZITHROMAX) 500 mg in dextrose 5 % 250 mL IVPB     500 mg 250 mL/hr over 60 Minutes Intravenous  Once 06/11/16 1656        Subjective:   Jeremiah Baldwin seen and examined today.  He denies any further shortness of breath. Denies any chest pain, abdominal pain, nausea or vomiting, diarrhea or constipation at this time. Has occasional cough.  Objective:   Vitals:   06/12/16 0243 06/12/16 0245 06/12/16 0305 06/12/16 0748  BP:  (!) 80/52 96/61 103/68  Pulse: (!) 105 (!) 107 (!) 105 (!) 102  Resp: (!) 31 (!) 33 (!) 30 (!) 26  Temp: 98.1 F (36.7 C)   98.8 F (37.1 C)  TempSrc: Oral   Oral  SpO2: 99% 98% 100% 93%  Weight:      Height:        Intake/Output Summary (Last 24 hours) at 06/12/16 1123 Last data filed at 06/12/16 0247  Gross per 24 hour  Intake              340 ml  Output              300 ml  Net               40 ml   Filed Weights   06/11/16 1831  Weight: 48.7 kg (107 lb 5.8 oz)    Exam  General: Well developed, elderly, ill appearing, no distress  HEENT: NCAT, mucous membranes dry  Cardiovascular: S1 S2 auscultated, tachycardic  Respiratory: Diminished breath  sounds, rhonchi  Abdomen: Soft, nontender, nondistended, + bowel sounds  Extremities: warm dry without cyanosis clubbing or edema of RLE. L AKA  Neuro: AAOx3, nonfoca  Psych: Approriate   Data Reviewed: I have personally reviewed following labs and imaging studies  CBC:  Recent Labs Lab 06/11/16 1243 06/11/16 1835  WBC 22.9* 19.7*  NEUTROABS 19.3* 17.9*  HGB 11.3* 11.4*  HCT 35.6* 35.2*  MCV 84.2 82.4  PLT 292 [id number]   Basic Metabolic Panel:  Recent Labs Lab 06/11/16 1243 06/11/16 1835  NA 140 137  K 4.2 4.3  CL 104 101  CO2 23 23  GLUCOSE 290* 279*  BUN 29* 32*  CREATININE 1.03 0.99  CALCIUM 9.2 9.2   GFR: Estimated Creatinine Clearance: 47.8 mL/min (by C-G formula based on SCr of 0.99 mg/dL). Liver Function Tests:  Recent Labs Lab 06/11/16 1243 06/11/16 1835  AST 18 18  ALT 9* 10*  ALKPHOS 97 97  BILITOT 0.8 0.7  PROT 6.7 7.0  ALBUMIN 3.1* 3.1*   No results for input(s): LIPASE, AMYLASE in the last 168 hours. No results for input(s): AMMONIA in the last 168 hours. Coagulation Profile:  Recent Labs Lab 06/11/16 1835  INR 1.14   Cardiac Enzymes:  Recent Labs Lab 06/11/16 1243 06/11/16 1835  TROPONINI 0.10* <0.03   BNP (last 3 results) No results for input(s): PROBNP in the last 8760 hours. HbA1C: No results for input(s): HGBA1C in the last 72 hours. CBG: No results for input(s): GLUCAP in the last 168 hours. Lipid Profile: No results for input(s): CHOL, HDL, LDLCALC, TRIG, CHOLHDL, LDLDIRECT in the last 72 hours. Thyroid Function Tests: No results for input(s): TSH, T4TOTAL, FREET4, T3FREE, THYROIDAB in the last 72 hours. Anemia Panel: No results for input(s): VITAMINB12, FOLATE, FERRITIN, TIBC, IRON, RETICCTPCT in the last 72 hours. Urine analysis: No results found for: COLORURINE, APPEARANCEUR, LABSPEC, PHURINE, GLUCOSEU, HGBUR, BILIRUBINUR, KETONESUR, PROTEINUR, UROBILINOGEN, NITRITE, LEUKOCYTESUR Sepsis  Labs: @LABRCNTIP (procalcitonin:4,lacticidven:4)  ) Recent Results (from the past 240 hour(s))  MRSA PCR Screening     Status: Abnormal   Collection Time: 06/11/16  6:30 PM  Result Value Ref Range Status   MRSA by PCR POSITIVE (A) NEGATIVE Final    Comment:        The GeneXpert MRSA Assay (FDA approved for NASAL specimens only), is one component of a comprehensive MRSA colonization surveillance program. It is not intended to diagnose MRSA infection nor to guide or monitor treatment for MRSA infections. RESULT CALLED TO, READ BACK BY AND VERIFIED WITH: BALDWIN,O RN 06/11/16 AT 2030 [facility]       Radiology Studies: Dg Abdomen 1 View  Result Date: 06/11/2016 CLINICAL DATA:  70 year old male left-side-down lateral  decubitus view for possible free air. Initial encounter. EXAM: ABDOMEN - 1 VIEW COMPARISON:  CT Abdomen and Pelvis 04/23/2016. FINDINGS: No pneumoperitoneum. Visualized bowel gas pattern is non obstructed. Negative visible right lung base. No acute osseous abnormality identified. IMPRESSION: Negative for free air.  Bowel gas pattern appears normal. Electronically Signed   By: Genevie Ann M.D.   On: 06/11/2016 16:32   Dg Chest Port 1 View  Result Date: 06/12/2016 CLINICAL DATA:  Sepsis, history of coronary artery disease, diabetes, previous CVA EXAM: PORTABLE CHEST 1 VIEW COMPARISON:  Portable chest x-ray of June 11, 2016 and April 24, 2016. FINDINGS: The lungs are less well inflated today. There is patchy density at the left lung base obscuring the hemidiaphragm. The interstitial markings in the right lung are mildly increased though stable. The heart and pulmonary vascularity are normal. The mediastinum is normal in width. The observed bony thorax is unremarkable. IMPRESSION: Left lower lobe atelectasis or pneumonia more conspicuous today. Mild stable interstitial prominence on the right may reflect interstitial edema or subsegmental atelectasis. Electronically Signed   By:  David  Martinique M.D.   On: 06/12/2016 07:49   Dg Chest Portable 1 View  Result Date: 06/11/2016 CLINICAL DATA:  Reason for exam: respiratory distress. Medical hx: diabetes, hypertension. Pt is paralyzed on left side; pt has ETT. EXAM: PORTABLE CHEST 1 VIEW COMPARISON:  04/24/2016 FINDINGS: Position degraded exam, with to patient rotated to the right and chin overlying the apex of the right upper lobe. Normal heart size. Moderate left hemidiaphragm elevation. No pleural effusion or pneumothorax. Improved left base aeration with mild volume loss remaining. No well-defined consolidation identified. Interval removal of right PICC line. IMPRESSION: Left hemidiaphragm elevation with improved left base aeration. No acute findings. Decreased sensitivity and specificity exam due to technique related factors, as described above. No endotracheal tube identified.  Correlate with clinical history. Electronically Signed   By: Abigail Miyamoto M.D.   On: 06/11/2016 12:49     Scheduled Meds: . albuterol  5 mg Nebulization Once  . azithromycin  500 mg Intravenous Once  . azithromycin  500 mg Intravenous Q24H  . buPROPion  150 mg Oral Daily  . ceFEPime (MAXIPIME) IV  2 g Intravenous Q24H  . Chlorhexidine Gluconate Cloth  6 each Topical Q0600  . clopidogrel  75 mg Oral Daily  . docusate sodium  200 mg Oral TID  . enalapril  5 mg Oral Daily  . enoxaparin (LOVENOX) injection  40 mg Subcutaneous Q24H  . feeding supplement (GLUCERNA SHAKE)  237 mL Oral BID BM  . feeding supplement (PRO-STAT SUGAR FREE 64)  30 mL Oral BID  . meclizine  25 mg Oral TID  . mirtazapine  7.5 mg Oral QHS  . mupirocin ointment  1 application Nasal BID  . olopatadine  1 drop Both Eyes Daily  . pantoprazole  40 mg Oral Q0600  . sertraline  100 mg Oral Daily  . tamsulosin  0.4 mg Oral Daily  . vancomycin  500 mg Intravenous Q12H   Continuous Infusions:   LOS: 1 day   Time Spent in minutes   30 minutes  Britney Newstrom D.O. on  06/12/2016 at 11:23 AM  Between 7am to 7pm - Pager - [phone]  After 7pm go to [email or web address] - password [email or web address]  And look for the night coverage person covering for me after hours  [facility] Office  [phone]

## 2016-06-12 NOTE — Care Management Note (Signed)
Case Management Note  Patient Details  Name: Jeremiah Baldwin MRN: [id number] Date of Birth: [birth year]  Subjective/Objective:    Presents with pna and sepsis, has hx of l bka,  He is from [facility], CSW aware and following for patient's return back to snf.                Action/Plan:   Expected Discharge Date:                  Expected Discharge Plan:  Skilled Nursing Facility  In-House Referral:  Clinical Social Work  Discharge planning Services  CM Consult  Post Acute Care Choice:    Choice offered to:     DME Arranged:    DME Agency:     HH Arranged:    [place] Agency:     Status of Service:  Completed, signed off  If discussed at [employer], dates discussed:    Additional Comments:  Zenon Mayo, RN 06/12/2016, 4:12 PM

## 2016-06-12 NOTE — Progress Notes (Signed)
Patient from [facility]. CSW following for disposition back to SNF when medically cleared and stable for discharge.          Emiliano Dyer, LCSW [facility] ED/68M Clinical Social Worker [phone]

## 2016-06-12 NOTE — Progress Notes (Signed)
Wife states that patient is diabetic and has his blood sugar taken multiple times a day at his facility.  There are currently no orders for CBGs or insulin.  TR[facility] notified. No new orders at this time.

## 2016-06-12 NOTE — Evaluation (Signed)
Clinical/Bedside Swallow Evaluation Patient Details  Name: Jeremiah Baldwin MRN: [id number] Date of Birth: [birth year]  Today's Date: 06/12/2016 Time: SLP Start Time (ACUTE ONLY): 1155 SLP Stop Time (ACUTE ONLY): 1220 SLP Time Calculation (min) (ACUTE ONLY): 25 min  Past Medical History:  Past Medical History:  Diagnosis Date  . Allergy   . Anorexia   . CAD (coronary artery disease)   . Colon polyp   . Diabetes mellitus without complication ([place])   . Diabetic retinopathy ([place])   . GERD (gastroesophageal reflux disease)   . Hyperlipidemia   . Hypertension   . Left hemiparesis ([place])   . PVD (peripheral vascular disease) ([place])   . Stroke [facility])    Past Surgical History:  Past Surgical History:  Procedure Laterality Date  . enteroscopic laser photocoagulation Left 2001  . left bka     HPI:  70 y.o. male from SNF admitted with PNA and Sepsis. Pt with hx of CVA with left hemiparesis, L BKA, CAD, Anorexia, DM, Retinopathy, HTN, and PVD.    Assessment / Plan / Recommendation Clinical Impression  Pt presents with a likely chronic, manageable dysphagia for which he is now decompensating.  Today, he presents with immediate and consistent coughing with all boluses of thin liquid, concerning for aspiration. There is oral spillage from right side of mouth with minimal awareness.  Pt with hypophonia, weight loss per wife, and coughing at most meals per Mrs. Nestor.  RR in mid-30s for most of session, raising concerns for adequate swallow/respiratory reciprocity.  Given number of risk factors for aspiration, recommend downgrading diet to dysphagia 3, nectar-thick liquids.  Pt may benefit from MBS to determine potential for strategies/postural adjustments for improved safety.  He and his wife agree with plan.  Will follow.     Aspiration Risk  Moderate aspiration risk    Diet Recommendation   dysphagia 3, nectar-thick liquids  Medication Administration: Whole meds with puree    Other   Recommendations Oral Care Recommendations: Oral care BID Other Recommendations: Order thickener from pharmacy   Follow up Recommendations Skilled Nursing facility      Frequency and Duration min 3x week  2 weeks       Prognosis Prognosis for Safe Diet Advancement: Fair      Swallow Study   General Date of Onset: 06/11/16 HPI: 70 y.o. male from SNF admitted with PNA and Sepsis. Pt with hx of CVA with left hemiparesis, L BKA, CAD, Anorexia, DM, Retinopathy, HTN, and PVD.  Type of Study: Bedside Swallow Evaluation Previous Swallow Assessment: MBS in 2009, but records are not accessible Diet Prior to this Study: Regular;Thin liquids Temperature Spikes Noted: No Respiratory Status: Nasal cannula History of Recent Intubation: No Behavior/Cognition: Alert;Cooperative Oral Cavity Assessment: Within Functional Limits Oral Care Completed by SLP: No Oral Cavity - Dentition: Missing dentition Vision: Functional for self-feeding Self-Feeding Abilities: Able to feed self;Needs assist Patient Positioning: Upright in bed Baseline Vocal Quality: Hoarse;Low vocal intensity Volitional Cough: Weak Volitional Swallow: Able to elicit    Oral/Motor/Sensory Function Overall Oral Motor/Sensory Function: Mild impairment Facial Symmetry: Abnormal symmetry left Lingual Symmetry: Abnormal symmetry left   Ice Chips Ice chips: Not tested   Thin Liquid Thin Liquid: Impaired Presentation: Cup;Self Fed;Straw Oral Phase Impairments: Reduced labial seal Oral Phase Functional Implications: Right anterior spillage Pharyngeal  Phase Impairments: Suspected delayed Swallow;Cough - Immediate    Nectar Thick Nectar Thick Liquid: Impaired Presentation: Cup;Self Fed Oral Phase Impairments: Reduced labial seal Oral phase functional implications: Right anterior  spillage   Honey Thick Honey Thick Liquid: Not tested   Puree Puree: Within functional limits   Solid   GO   Solid: Impaired Oral Phase Functional  Implications: Impaired mastication        Jeremiah Baldwin 06/12/2016,12:33 PM

## 2016-06-13 ENCOUNTER — Inpatient Hospital Stay (HOSPITAL_COMMUNITY): Payer: Medicare Other

## 2016-06-13 DIAGNOSIS — I639 Cerebral infarction, unspecified: Secondary | ICD-10-CM

## 2016-06-13 DIAGNOSIS — L89302 Pressure ulcer of unspecified buttock, stage 2: Secondary | ICD-10-CM

## 2016-06-13 DIAGNOSIS — J9601 Acute respiratory failure with hypoxia: Secondary | ICD-10-CM

## 2016-06-13 LAB — URINE CULTURE

## 2016-06-13 LAB — ECHOCARDIOGRAM COMPLETE
Height: 74 in
WEIGHTICAEL: 1717.82 [oz_av]

## 2016-06-13 LAB — BASIC METABOLIC PANEL
Anion gap: 9 (ref 5–15)
BUN: 28 mg/dL — AB (ref 4–21)
BUN: 28 mg/dL — AB (ref 6–20)
CALCIUM: 8.5 mg/dL — AB (ref 8.9–10.3)
CHLORIDE: 101 mmol/L (ref 101–111)
CO2: 23 mmol/L (ref 22–32)
CREATININE: 0.87 mg/dL (ref 0.61–1.24)
Creatinine: 0.9 mg/dL (ref 0.6–1.3)
GFR calc non Af Amer: >60 mL/min (ref >60)
GLUCOSE: 205 mg/dL
Glucose, Bld: 205 mg/dL — ABNORMAL HIGH (ref 65–99)
Potassium: 3.8 mmol/L (ref 3.4–5.3)
Potassium: 3.8 mmol/L (ref 3.5–5.1)
SODIUM: 133 mmol/L — AB (ref 135–145)
Sodium: 133 mmol/L — AB (ref 137–147)

## 2016-06-13 LAB — CBC AND DIFFERENTIAL
HEMATOCRIT: 30 % — AB (ref 41–53)
HEMOGLOBIN: 9.5 g/dL — AB (ref 13.5–17.5)
PLATELETS: 189 10*3/uL (ref 150–399)
WBC: 14.1 10*3/mL

## 2016-06-13 LAB — CBC
HCT: 29.5 % — ABNORMAL LOW (ref 39.0–52.0)
HEMOGLOBIN: 9.5 g/dL — AB (ref 13.0–17.0)
MCH: 26.3 pg (ref 26.0–34.0)
MCHC: 32.2 g/dL (ref 30.0–36.0)
MCV: 81.7 fL (ref 78.0–100.0)
Platelets: 189 10*3/uL (ref 150–400)
RBC: 3.61 MIL/uL — ABNORMAL LOW (ref 4.22–5.81)
RDW: 16.3 % — AB (ref 11.5–15.5)
WBC: 14.1 10*3/uL — ABNORMAL HIGH (ref 4.0–10.5)

## 2016-06-13 LAB — GLUCOSE, CAPILLARY
GLUCOSE-CAPILLARY: 119 mg/dL — AB (ref 65–99)
Glucose-Capillary: 176 mg/dL — ABNORMAL HIGH (ref 65–99)

## 2016-06-13 MED ORDER — PERFLUTREN LIPID MICROSPHERE
1.0000 mL | INTRAVENOUS | Status: AC | PRN
  Administered 2016-06-13: 4 mL via INTRAVENOUS
  Filled 2016-06-13: qty 10

## 2016-06-13 MED ORDER — INSULIN ASPART 100 UNIT/ML ~~LOC~~ SOLN
0.0000 [IU] | Freq: Three times a day (TID) | SUBCUTANEOUS | Status: DC
  Administered 2016-06-14: 1 [IU] via SUBCUTANEOUS
  Administered 2016-06-14 – 2016-06-15 (×2): 2 [IU] via SUBCUTANEOUS

## 2016-06-13 MED ORDER — SODIUM CHLORIDE 0.9% FLUSH: 10.0000 mL | INTRAVENOUS | Status: DC | PRN

## 2016-06-13 NOTE — Progress Notes (Signed)
Speech Language Pathology Treatment: Dysphagia  Patient Details Name: Jeremiah Baldwin MRN: [id number] DOB: 1946/03/18 Today's Date: 06/13/2016 Time: 3202-3343 SLP Time Calculation (min) (ACUTE ONLY): 15 min  Assessment / Plan / Recommendation Clinical Impression  Assisted pt with breakfast meal; his wife arrived, and we reviewed results of MBS.  Pt tolerated thin liquids with breakfast as long as he consumed controlled sips.  When drinking large, successive boluses, he immediately began coughing.  Despite prolonged mastication, his wife would like him to remain on a regular diet so that he has wider food choices and can self-feed with more success (e.g., finger foods).  Pt's swallowing function is going to fluctuate- educated Mrs. Neas re: general aspiration risks balanced with QOL/pleasure derived from POs.  For now, will advance diet back to regular solids, thin liquids with basic precautions and assistance.  No further SLP f/u is warranted - our services will sign off.    HPI HPI: 70 y.o. male from SNF admitted with PNA and Sepsis. Pt with hx of CVA with left hemiparesis, L BKA, CAD, Anorexia, DM, Retinopathy, HTN, and PVD.       SLP Plan  All goals met     Recommendations  Diet recommendations: Regular;Thin liquid Liquids provided via: Cup;Straw Medication Administration: Whole meds with puree Supervision: Staff to assist with self feeding Compensations: Slow rate;Small sips/bites;Monitor for anterior loss Postural Changes and/or Swallow Maneuvers: Seated upright 90 degrees                Oral Care Recommendations: Oral care BID Follow up Recommendations: Skilled Nursing facility Plan: All goals met       GO               Eryc Bodey L. Tivis Ringer, [place] CCC/SLP Pager [phone]  [name] 06/13/2016, 10:31 AM

## 2016-06-13 NOTE — Progress Notes (Addendum)
PROGRESS NOTE    Jeremiah Baldwin  [id number] DOB: 1946-04-24 DOA: 06/11/2016 PCP: No primary care provider on file.   Chief Complaint  Patient presents with  . Respiratory Distress     Brief Narrative:  70 year oldHistory of CAD, left BKA, CVA with left-sided weakness, presented with respiratory distress and hypoxia. Patient being treated for sepsis secondary to pneumonia and bacteremia.  Assessment & Plan   Acute hypoxic respiratory failure -Upon admission, oxygen saturations are to be in the 70s -Possibly secondary to pneumonia -Continue supplemental oxygen to maintain saturations above 92% -Continue neb treatments  Sepsis secondary to pneumonia/bacteremia -Upon admission, patient was noted to be tachycardic, tachypneic, with leukocytosis and hypotension. lactic acid was up to 3.4 -WBC improving, lactic acid 1.8 -Left lower lobe atelectasis or pneumonia -Continue vancomycin, cefepime, azithromycin -Influenza PCR negative -Blood cultures 06/11/2016: 1/2 MRSA -Echocardiogram ordered and pending -Repeat blood cultures  06/13/2016 show no growth to date  Diabetes mellitus, type II -Metformin held, continue insulin sliding scale CBG monitoring  History of of CVA with left-sided residual weakness -Continue Plavix -?no statin  Depression -Continue Zoloft, Wellbutrin  GERD  -Continue PPI  ?dysphagia vs aspiration -Monitored patient while he was eating breakfast this morning, seems to hold onto food in his mouth. -Speech consulted and appreciated -had modified barium swallow: dysphagia 3, thin liquid  Normocytic anemia -baseline hemoglobin 11, currently 9.5 (drop likely dilutional) -Continue to monitor CBC  DVT Prophylaxis  Lovenox  Code Status: DO NOT RESUSCITATE  Family Communication: None bedside  Disposition Plan: Admitted. Continue to monitor and stepdown. Discharge to SNF when stable.  Consultants PCCM  Procedures  None  Antibiotics     Anti-infectives    Start     Dose/Rate Route Frequency Ordered Stop   06/12/16 1700  azithromycin (ZITHROMAX) 500 mg in dextrose 5 % 250 mL IVPB  Status:  Discontinued     500 mg 250 mL/hr over 60 Minutes Intravenous Every 24 hours 06/11/16 1709 06/12/16 1132   06/12/16 1230  azithromycin (ZITHROMAX) 500 mg in dextrose 5 % 250 mL IVPB     500 mg 250 mL/hr over 60 Minutes Intravenous Every 24 hours 06/12/16 1132     06/11/16 1800  vancomycin (VANCOCIN) 500 mg in sodium chloride 0.9 % 100 mL IVPB     500 mg 100 mL/hr over 60 Minutes Intravenous Every 12 hours 06/11/16 1708     06/11/16 1730  ceFEPIme (MAXIPIME) 2 g in dextrose 5 % 50 mL IVPB     2 g 100 mL/hr over 30 Minutes Intravenous Every 24 hours 06/11/16 1708     06/11/16 1700  azithromycin (ZITHROMAX) 500 mg in dextrose 5 % 250 mL IVPB  Status:  Discontinued     500 mg 250 mL/hr over 60 Minutes Intravenous  Once 06/11/16 1656 06/12/16 1132      Subjective:   Jeremiah Baldwin seen and examined today.  He denies any further shortness of breath, although O2 sats 87-89% on room air. Denies any chest pain, abdominal pain, nausea or vomiting, diarrhea or constipation at this time.   Objective:   Vitals:   06/12/16 2248 06/12/16 2327 06/13/16 0327 06/13/16 0738  BP: 100/72  102/63 94/62  Pulse: (!) 107  98 100  Resp: (!) 35 (!) 36 (!) 30 (!) 33  Temp: 99.7 F (37.6 C)  99.4 F (37.4 C) 98.2 F (36.8 C)  TempSrc: Oral  Oral Axillary  SpO2: 95% 90% 94% 93%  Weight:  Height:        Intake/Output Summary (Last 24 hours) at 06/13/16 1034 Last data filed at 06/13/16 0329  Gross per 24 hour  Intake              440 ml  Output             1150 ml  Net             -710 ml   Filed Weights   06/11/16 1831  Weight: 48.7 kg (107 lb 5.8 oz)    Exam  General: Well developed, elderly, ill appearing, no distress  HEENT: NCAT, mucous membranes moist  Cardiovascular: S1 S2 auscultated, tachycardic  Respiratory: Diminished  breath sounds, no wheezing.   Abdomen: Soft, nontender, nondistended, + bowel sounds  Extremities: warm dry without cyanosis clubbing or edema of RLE. L AKA  Neuro: AAOx3, nonfocal (has residual left sided weakness)  Psych: Approriate   Data Reviewed: I have personally reviewed following labs and imaging studies  CBC:  Recent Labs Lab 06/11/16 1243 06/11/16 1835 06/13/16 0023  WBC 22.9* 19.7* 14.1*  NEUTROABS 19.3* 17.9*  --   HGB 11.3* 11.4* 9.5*  HCT 35.6* 35.2* 29.5*  MCV 84.2 82.4 81.7  PLT 292 241 [id number]   Basic Metabolic Panel:  Recent Labs Lab 06/11/16 1243 06/11/16 1835 06/13/16 0023  NA 140 137 133*  K 4.2 4.3 3.8  CL 104 101 101  CO2 23 23 23   GLUCOSE 290* 279* 205*  BUN 29* 32* 28*  CREATININE 1.03 0.99 0.87  CALCIUM 9.2 9.2 8.5*   GFR: Estimated Creatinine Clearance: 54.4 mL/min (by C-G formula based on SCr of 0.87 mg/dL). Liver Function Tests:  Recent Labs Lab 06/11/16 1243 06/11/16 1835  AST 18 18  ALT 9* 10*  ALKPHOS 97 97  BILITOT 0.8 0.7  PROT 6.7 7.0  ALBUMIN 3.1* 3.1*   No results for input(s): LIPASE, AMYLASE in the last 168 hours. No results for input(s): AMMONIA in the last 168 hours. Coagulation Profile:  Recent Labs Lab 06/11/16 1835  INR 1.14   Cardiac Enzymes:  Recent Labs Lab 06/11/16 1243 06/11/16 1835  TROPONINI 0.10* <0.03   BNP (last 3 results) No results for input(s): PROBNP in the last 8760 hours. HbA1C: No results for input(s): HGBA1C in the last 72 hours. CBG:  Recent Labs Lab 06/12/16 2035  GLUCAP 232*   Lipid Profile: No results for input(s): CHOL, HDL, LDLCALC, TRIG, CHOLHDL, LDLDIRECT in the last 72 hours. Thyroid Function Tests: No results for input(s): TSH, T4TOTAL, FREET4, T3FREE, THYROIDAB in the last 72 hours. Anemia Panel: No results for input(s): VITAMINB12, FOLATE, FERRITIN, TIBC, IRON, RETICCTPCT in the last 72 hours. Urine analysis:    Component Value Date/Time   COLORURINE  YELLOW 06/11/2016 1701   APPEARANCEUR CLOUDY (A) 06/11/2016 1701   LABSPEC 1.018 06/11/2016 1701   PHURINE 5.5 06/11/2016 1701   GLUCOSEU 250 (A) 06/11/2016 1701   HGBUR SMALL (A) 06/11/2016 1701   BILIRUBINUR NEGATIVE 06/11/2016 1701   KETONESUR NEGATIVE 06/11/2016 1701   PROTEINUR NEGATIVE 06/11/2016 1701   NITRITE NEGATIVE 06/11/2016 1701   LEUKOCYTESUR LARGE (A) 06/11/2016 1701   Sepsis Labs: @LABRCNTIP (procalcitonin:4,lacticidven:4)  ) Recent Results (from the past 240 hour(s))  Culture, blood (x 2)     Status: Abnormal (Preliminary result)   Collection Time: 06/11/16 12:15 PM  Result Value Ref Range Status   Specimen Description BLOOD RIGHT WRIST  Final   Special Requests BOTTLES DRAWN AEROBIC AND ANAEROBIC  5CC  Final   Culture  Setup Time   Final    GRAM POSITIVE COCCI IN CLUSTERS AEROBIC BOTTLE ONLY CRITICAL RESULT CALLED TO, READ BACK BY AND VERIFIED WITH: A. ANDERSON, PHARMD AT 1330 ON 06/12/16 BY C. JESSUP, MLT.    Culture (A)  Final    STAPHYLOCOCCUS SPECIES (COAGULASE NEGATIVE) THE SIGNIFICANCE OF ISOLATING THIS ORGANISM FROM A SINGLE VENIPUNCTURE CANNOT BE PREDICTED WITHOUT FURTHER CLINICAL AND CULTURE CORRELATION. SUSCEPTIBILITIES AVAILABLE ONLY ON REQUEST.    Report Status PENDING  Incomplete  Blood Culture ID Panel (Reflexed)     Status: Abnormal   Collection Time: 06/11/16 12:15 PM  Result Value Ref Range Status   Enterococcus species NOT DETECTED NOT DETECTED Final   Listeria monocytogenes NOT DETECTED NOT DETECTED Final   Staphylococcus species DETECTED (A) NOT DETECTED Final    Comment: CRITICAL RESULT CALLED TO, READ BACK BY AND VERIFIED WITH: A. ANDERSON, PHARMD AT 1330 ON 06/12/16 BY C. JESSUP, MLT.    Staphylococcus aureus NOT DETECTED NOT DETECTED Final   Methicillin resistance DETECTED (A) NOT DETECTED Final    Comment: CRITICAL RESULT CALLED TO, READ BACK BY AND VERIFIED WITH: A. ANDERSON, PHARMD AT 1330 ON 06/12/16 BY C. JESSUP, MLT.     Streptococcus species NOT DETECTED NOT DETECTED Final   Streptococcus agalactiae NOT DETECTED NOT DETECTED Final   Streptococcus pneumoniae NOT DETECTED NOT DETECTED Final   Streptococcus pyogenes NOT DETECTED NOT DETECTED Final   Acinetobacter baumannii NOT DETECTED NOT DETECTED Final   Enterobacteriaceae species NOT DETECTED NOT DETECTED Final   Enterobacter cloacae complex NOT DETECTED NOT DETECTED Final   Escherichia coli NOT DETECTED NOT DETECTED Final   Klebsiella oxytoca NOT DETECTED NOT DETECTED Final   Klebsiella pneumoniae NOT DETECTED NOT DETECTED Final   Proteus species NOT DETECTED NOT DETECTED Final   Serratia marcescens NOT DETECTED NOT DETECTED Final   Haemophilus influenzae NOT DETECTED NOT DETECTED Final   Neisseria meningitidis NOT DETECTED NOT DETECTED Final   Pseudomonas aeruginosa NOT DETECTED NOT DETECTED Final   Candida albicans NOT DETECTED NOT DETECTED Final   Candida glabrata NOT DETECTED NOT DETECTED Final   Candida krusei NOT DETECTED NOT DETECTED Final   Candida parapsilosis NOT DETECTED NOT DETECTED Final   Candida tropicalis NOT DETECTED NOT DETECTED Final  MRSA PCR Screening     Status: Abnormal   Collection Time: 06/11/16  6:30 PM  Result Value Ref Range Status   MRSA by PCR POSITIVE (A) NEGATIVE Final    Comment:        The GeneXpert MRSA Assay (FDA approved for NASAL specimens only), is one component of a comprehensive MRSA colonization surveillance program. It is not intended to diagnose MRSA infection nor to guide or monitor treatment for MRSA infections. RESULT CALLED TO, READ BACK BY AND VERIFIED WITH: BALDWIN,O RN 06/11/16 AT 2030 SKEEN,P   Culture, blood (x 2)     Status: None (Preliminary result)   Collection Time: 06/11/16  6:35 PM  Result Value Ref Range Status   Specimen Description BLOOD RIGHT ARM  Final   Special Requests BOTTLES DRAWN AEROBIC AND ANAEROBIC 5ML EACH  Final   Culture NO GROWTH 2 DAYS  Final   Report Status  PENDING  Incomplete  Culture, blood (routine x 2)     Status: None (Preliminary result)   Collection Time: 06/13/16 12:17 AM  Result Value Ref Range Status   Specimen Description BLOOD RIGHT ANTECUBITAL  Final   Special Requests IN PEDIATRIC BOTTLE  [place]  Final   Culture NO GROWTH < 12 HOURS  Final   Report Status PENDING  Incomplete  Culture, blood (routine x 2)     Status: None (Preliminary result)   Collection Time: 06/13/16 12:22 AM  Result Value Ref Range Status   Specimen Description BLOOD LEFT HAND  Final   Special Requests IN PEDIATRIC BOTTLE 1CC  Final   Culture NO GROWTH < 12 HOURS  Final   Report Status PENDING  Incomplete      Radiology Studies: Dg Abdomen 1 View  Result Date: 06/11/2016 CLINICAL DATA:  70 year old male left-side-down lateral decubitus view for possible free air. Initial encounter. EXAM: ABDOMEN - 1 VIEW COMPARISON:  CT Abdomen and Pelvis 04/23/2016. FINDINGS: No pneumoperitoneum. Visualized bowel gas pattern is non obstructed. Negative visible right lung base. No acute osseous abnormality identified. IMPRESSION: Negative for free air.  Bowel gas pattern appears normal. Electronically Signed   By: Genevie Ann M.D.   On: 06/11/2016 16:32   Dg Chest Port 1 View  Result Date: 06/12/2016 CLINICAL DATA:  Sepsis, history of coronary artery disease, diabetes, previous CVA EXAM: PORTABLE CHEST 1 VIEW COMPARISON:  Portable chest x-ray of June 11, 2016 and April 24, 2016. FINDINGS: The lungs are less well inflated today. There is patchy density at the left lung base obscuring the hemidiaphragm. The interstitial markings in the right lung are mildly increased though stable. The heart and pulmonary vascularity are normal. The mediastinum is normal in width. The observed bony thorax is unremarkable. IMPRESSION: Left lower lobe atelectasis or pneumonia more conspicuous today. Mild stable interstitial prominence on the right may reflect interstitial edema or subsegmental  atelectasis. Electronically Signed   By: David  Martinique M.D.   On: 06/12/2016 07:49   Dg Chest Portable 1 View  Result Date: 06/11/2016 CLINICAL DATA:  Reason for exam: respiratory distress. Medical hx: diabetes, hypertension. Pt is paralyzed on left side; pt has ETT. EXAM: PORTABLE CHEST 1 VIEW COMPARISON:  04/24/2016 FINDINGS: Position degraded exam, with to patient rotated to the right and chin overlying the apex of the right upper lobe. Normal heart size. Moderate left hemidiaphragm elevation. No pleural effusion or pneumothorax. Improved left base aeration with mild volume loss remaining. No well-defined consolidation identified. Interval removal of right PICC line. IMPRESSION: Left hemidiaphragm elevation with improved left base aeration. No acute findings. Decreased sensitivity and specificity exam due to technique related factors, as described above. No endotracheal tube identified.  Correlate with clinical history. Electronically Signed   By: Abigail Miyamoto M.D.   On: 06/11/2016 12:49   Dg Swallowing Func-speech Pathology  Result Date: 06/13/2016 Objective Swallowing Evaluation: Type of Study: MBS-Modified Barium Swallow Study Patient Details Name: Jeremiah Baldwin MRN: [id number] Date of Birth: 12-17-1945 Today's Date: 06/13/2016 Time: SLP Start Time (ACUTE ONLY): 0930-SLP Stop Time (ACUTE ONLY): 0950 SLP Time Calculation (min) (ACUTE ONLY): 20 min Past Medical History: Past Medical History: Diagnosis Date . Allergy  . Anorexia  . CAD (coronary artery disease)  . Colon polyp  . Diabetes mellitus without complication ([place])  . Diabetic retinopathy ([place])  . GERD (gastroesophageal reflux disease)  . Hyperlipidemia  . Hypertension  . Left hemiparesis ([place])  . PVD (peripheral vascular disease) ([place])  . Stroke [facility])  Past Surgical History: Past Surgical History: Procedure Laterality Date . enteroscopic laser photocoagulation Left 2001 . left bka   HPI: 70 y.o. male from SNF admitted with PNA and Sepsis. Pt  with hx of CVA with left hemiparesis, L  BKA, CAD, Anorexia, DM, Retinopathy, HTN, and PVD.  Subjective: alert, poor spontaneity, limited speech today Assessment / Plan / Recommendation CHL IP CLINICAL IMPRESSIONS 06/13/2016 Therapy Diagnosis Moderate oral phase dysphagia Clinical Impression Pt presented with a primarily oral dysphagia with excessive oral holding of POs, requiring moderate cueing to initiate mastication/oral movement.  The swallow was delayed to the pyriform sinuses for puree and thin liquids, but once triggered, pharyngeal clearance was normal and there was adequate airway protection.  (No penetration nor aspiration noted, despite consumption of large, successive thin liquid boluses.)  Pt's performance during MBS is improved since clinical assessment yesterday.  Recommend advancing liquids back to thins, maintain dysphagia 3 consistency.  SLP will follow to ensure functional toleration of this diet.  Impact on safety and function Moderate aspiration risk   CHL IP TREATMENT RECOMMENDATION 06/12/2016 Treatment Recommendations Therapy as outlined in treatment plan below   Prognosis 06/13/2016 Prognosis for Safe Diet Advancement Fair Barriers to Reach Goals Cognitive deficits Barriers/Prognosis Comment -- CHL IP DIET RECOMMENDATION 06/13/2016 SLP Diet Recommendations Dysphagia 3 (Mech soft) solids;Thin liquid Liquid Administration via Cup;Straw Medication Administration Whole meds with puree Compensations Slow rate;Small sips/bites;Monitor for anterior loss Postural Changes --   CHL IP OTHER RECOMMENDATIONS 06/13/2016 Recommended Consults -- Oral Care Recommendations Oral care BID Other Recommendations --   CHL IP FOLLOW UP RECOMMENDATIONS 06/13/2016 Follow up Recommendations Skilled Nursing facility   [facility] IP FREQUENCY AND DURATION 06/13/2016 Speech Therapy Frequency (ACUTE ONLY) min 3x week Treatment Duration 1 week      CHL IP ORAL PHASE 06/13/2016 Oral Phase Impaired Oral - Pudding Teaspoon -- Oral  - Pudding Cup -- Oral - Honey Teaspoon -- Oral - Honey Cup -- Oral - Nectar Teaspoon -- Oral - Nectar Cup -- Oral - Nectar Straw -- Oral - Thin Teaspoon -- Oral - Thin Cup -- Oral - Thin Straw Right anterior bolus loss;Weak lingual manipulation;Holding of bolus;Delayed oral transit;Decreased bolus cohesion Oral - Puree Weak lingual manipulation;Holding of bolus;Delayed oral transit;Decreased bolus cohesion Oral - Mech Soft -- Oral - Regular -- Oral - Multi-Consistency -- Oral - Pill -- Oral Phase - Comment --  CHL IP PHARYNGEAL PHASE 06/13/2016 Pharyngeal Phase Impaired Pharyngeal- Pudding Teaspoon -- Pharyngeal -- Pharyngeal- Pudding Cup -- Pharyngeal -- Pharyngeal- Honey Teaspoon -- Pharyngeal -- Pharyngeal- Honey Cup -- Pharyngeal -- Pharyngeal- Nectar Teaspoon -- Pharyngeal -- Pharyngeal- Nectar Cup -- Pharyngeal -- Pharyngeal- Nectar Straw -- Pharyngeal -- Pharyngeal- Thin Teaspoon -- Pharyngeal -- Pharyngeal- Thin Cup -- Pharyngeal -- Pharyngeal- Thin Straw Delayed swallow initiation-pyriform sinuses Pharyngeal -- Pharyngeal- Puree Delayed swallow initiation-pyriform sinuses Pharyngeal -- Pharyngeal- Mechanical Soft -- Pharyngeal -- Pharyngeal- Regular -- Pharyngeal -- Pharyngeal- Multi-consistency -- Pharyngeal -- Pharyngeal- Pill -- Pharyngeal -- Pharyngeal Comment --  No flowsheet data found. No flowsheet data found. [name] 06/13/2016, 10:08 AM                Scheduled Meds: . albuterol  5 mg Nebulization Once  . azithromycin  500 mg Intravenous Q24H  . buPROPion  150 mg Oral Daily  . ceFEPime (MAXIPIME) IV  2 g Intravenous Q24H  . Chlorhexidine Gluconate Cloth  6 each Topical Q0600  . clopidogrel  75 mg Oral Daily  . docusate sodium  200 mg Oral TID  . enalapril  5 mg Oral Daily  . enoxaparin (LOVENOX) injection  40 mg Subcutaneous Q24H  . feeding supplement (PRO-STAT SUGAR FREE 64)  30 mL Oral BID  . meclizine  25  mg Oral TID  . mouth rinse  15 mL Mouth Rinse BID  .  mirtazapine  7.5 mg Oral QHS  . mupirocin ointment  1 application Nasal BID  . olopatadine  1 drop Both Eyes Daily  . pantoprazole  40 mg Oral Q0600  . sertraline  100 mg Oral Daily  . tamsulosin  0.4 mg Oral Daily  . vancomycin  500 mg Intravenous Q12H   Continuous Infusions:   LOS: 2 days   Time Spent in minutes   30 minutes  Giovoni Bunch D.O. on 06/13/2016 at 10:34 AM  Between 7am to 7pm - Pager - [phone]  After 7pm go to [email or web address] - password [email or web address]  And look for the night coverage person covering for me after hours  [facility] Office  [phone]  Addendum Severe protein calorie malnutrition -in the context of chronic illness -nutrition consulted, continue feeding supplements

## 2016-06-13 NOTE — Progress Notes (Signed)
Inpatient Diabetes Program Recommendations  AACE/ADA: New Consensus Statement on Inpatient Glycemic Control (2015)  Target Ranges:  Prepandial:   less than 140 mg/dL      Peak postprandial:   less than 180 mg/dL (1-2 hours)      Critically ill patients:  140 - 180 mg/dL   Lab Results  Component Value Date   GLUCAP 232 (H) 06/12/2016   HGBA1C 5.3 12/30/2015   Diabetes history: DM 2 Outpatient Diabetes medications: Lantus 10, Metformin 1000 mg BID Current orders for Inpatient glycemic control: None  Inpatient Diabetes Program Recommendations:   Glucose mid to high 200's. Please consider ordering CBG's and half of patient's home dose of basal insulin, Lantus 5 units and Novolog Sensitive TID + HS scale.  Thanks,  Tama Headings RN, MSN, [facility] Inpatient Diabetes Coordinator Team Pager [phone] (8a-5p)

## 2016-06-13 NOTE — Clinical Social Work Note (Signed)
Clinical Social Work Assessment  Patient Details  Name: Jeremiah Baldwin MRN: [id number] Date of Birth: July 16, 1946  Date of referral:  06/13/16               Reason for consult:  Discharge Planning                Permission sought to share information with:  Facility Sport and exercise psychologist, Family Supports Permission granted to share information::  Yes, Verbal Permission Granted  Name::     Dyke Weible  Agency::  [facility]  Relationship::  Wife  Contact Information:  [phone]  Housing/Transportation Living arrangements for the past 2 months:  [place] of Information:  Medical Team, Spouse Patient Interpreter Needed:  None Criminal Activity/Legal Involvement Pertinent to Current Situation/Hospitalization:  No - Comment as needed Significant Relationships:  Spouse Lives with:  Facility Resident Do you feel safe going back to the place where you live?  Yes Need for family participation in patient care:  Yes (Comment)  Care giving concerns:  Patient is from [facility]. PT recommending SNF once medically stable.   Social Worker assessment / plan:  CSW met with patient. Patient's wife at bedside. Per RN, patient not fully oriented. CSW introduced role and explained that discharge planning would be discussed. Patient's wife confirmed that patient is from [facility] and the plan is for him to return once stable for discharge. Patient has been a resident at [facility] since 2009. No further concerns. CSW encouraged patient's wife to contact CSW as needed. CSW will continue to follow patient and his wife for support and facilitate discharge back to SNF once medically stable.  Employment status:  Retired Forensic scientist:  Medicare PT Recommendations:  [place] / Referral to community resources:  [place]  Patient/Family's Response to care:  Patient not fully oriented. Patient's wife agreeable to return to SNF.  Patient's wife supportive and involved in patient's care. Patient's wife appreciated social work intervention.  Patient/Family's Understanding of and Emotional Response to Diagnosis, Current Treatment, and Prognosis:  Patient not fully oriented. Patient's wife understands and is agreeable to discharge plan. Patient's wife appears happy with hospital care.  Emotional Assessment Appearance:  Appears stated age Attitude/Demeanor/Rapport:  Unable to Assess Affect (typically observed):  Unable to Assess Orientation:  Oriented to Self, Oriented to Place, Oriented to  Time, Oriented to Situation Alcohol / Substance use:  Never Used Psych involvement (Current and /or in the community):  No (Comment)  Discharge Needs  Concerns to be addressed:  Care Coordination Readmission within the last 30 days:  No Current discharge risk:  Dependent with Mobility Barriers to Discharge:  No Barriers Identified   Candie Chroman, LCSW 06/13/2016, 1:45 PM

## 2016-06-13 NOTE — Progress Notes (Signed)
Modified Barium Swallow Progress Note  Patient Details  Name: Jeremiah Baldwin MRN: [id number] Date of Birth: 28-Sep-1945  Today's Date: 06/13/2016  Modified Barium Swallow completed.  Full report located under Chart Review in the Imaging Section.  Brief recommendations include the following:  Clinical Impression  Pt presented with a primarily oral dysphagia with excessive oral holding of POs, requiring moderate cueing to initiate mastication/oral movement.  The swallow was delayed to the pyriform sinuses for puree and thin liquids, but once triggered, pharyngeal clearance was normal and there was adequate airway protection.  (No penetration nor aspiration noted, despite consumption of large, successive thin liquid boluses.)  Pt's performance during MBS is improved since clinical assessment yesterday.  Recommend advancing liquids back to thins, maintain dysphagia 3 consistency.  Suspect function will fluctuate.  SLP will follow to ensure functional toleration of this diet.    Swallow Evaluation Recommendations       SLP Diet Recommendations: Dysphagia 3 (Mech soft) solids;Thin liquid   Liquid Administration via: Cup;Straw   Medication Administration: Whole meds with puree   Supervision: Staff to assist with self feeding   Compensations: Slow rate;Small sips/bites;Monitor for anterior loss       Oral Care Recommendations: Oral care BID        Jeremiah Baldwin 06/13/2016,10:09 AM

## 2016-06-13 NOTE — NC FL2 (Signed)
[place] LEVEL OF CARE SCREENING TOOL     IDENTIFICATION  Patient Name: Jeremiah Baldwin Birthdate: [birth year] Sex: male Admission Date (Current Location): 06/11/2016  [facility] and [place] Number:  Herbalist and Address:  The . [facility], [place] [address], [place], [place] [address]      Provider Number: [id number]  Attending Physician Name and Address:  Cristal Ford, DO  Relative Name and Phone Number:       Current Level of Care: Hospital Recommended Level of Care: [place] Prior Approval Number:    Date Approved/Denied:   PASRR Number: [id number]  Discharge Plan: SNF    Current Diagnoses: Patient Active Problem List   Diagnosis Date Noted  . Pressure injury of skin 06/12/2016  . Sepsis ([place]) 06/11/2016  . Anemia of chronic disease 06/04/2016  . Severe sepsis ([place]) 05/01/2016  . AKI (acute kidney injury) ([place]) 05/01/2016  . CAD (coronary artery disease) 05/01/2016  . Acute osteomyelitis of toe of right foot ([place]) 03/25/2016  . Leukocytosis 01/02/2016  . Anxiety state 07/02/2015  . Prostate cancer ([place]) 10/30/2014  . Fever presenting with conditions classified elsewhere 10/04/2014  . Acute urinary retention 09/16/2014  . Taking medication for chronic disease 09/07/2014  . Leukocytosis, unspecified 03/03/2014  . Peripheral vascular disease ([place]) 02/25/2014  . Conjunctivitis 02/25/2014  . Itching 10/23/2013  . Depression 04/17/2013  . Pruritic disorder 03/11/2013  . Urinary tract infection 02/27/2013  . Pure hypercholesterolemia 11/29/2012  . 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 Hyperlipidemia LDL goal <70 11/11/2012  . Loss of weight 11/11/2012  . Essential hypertension, benign 11/11/2012  . Unspecified constipation 11/11/2012  . Type 2 diabetes with complication  ([place]) [id number]  . Benign paroxysmal positional vertigo 11/11/2012  . CVA (cerebral vascular accident) ([place]) 11/11/2012  . Insomnia 11/11/2012  . GERD (gastroesophageal reflux disease) 11/11/2012    Orientation RESPIRATION BLADDER Height & Weight     Self, Time, Situation, Place  O2 (Nasal Canula 2 L) Indwelling catheter Weight: 107 lb 5.8 oz (48.7 kg) Height:  6\' 2"  (188 cm)  BEHAVIORAL SYMPTOMS/MOOD NEUROLOGICAL BOWEL NUTRITION STATUS   (None)  (None) Incontinent Diet (Carb modified)  AMBULATORY STATUS COMMUNICATION OF NEEDS Skin   Extensive Assist Verbally (Soft/no speech) Other (Comment), PU Stage and Appropriate Care (Deep tissue pressure injury mid upper sacrum: Foam prn.)   PU Stage 2 Dressing:  (Upper penis)                   Personal Care Assistance Level of Assistance              Functional Limitations Info  Sight, Hearing, Speech Sight Info: Adequate Hearing Info: Adequate Speech Info: Adequate    SPECIAL CARE FACTORS FREQUENCY  PT (By licensed PT), Blood pressure, Diabetic urine testing, Speech therapy     PT Frequency: 5 x week       Speech Therapy Frequency: 5 x week      Contractures Contractures Info: Not present    Additional Factors Info  Code Status, Allergies, Psychotropic, Isolation Precautions Code Status Info: DNR Allergies Info: Codeine Psychotropic Info: Depression, anxiety   Isolation Precautions Info: Contact: MRSA     Current Medications (06/13/2016):  This is the current hospital active medication list Current Facility-Administered Medications  Medication Dose Route Frequency Provider Last Rate Last Dose  . acetaminophen (  TYLENOL) tablet 650 mg  650 mg Oral Q6H PRN Maryann Mikhail, DO      . albuterol (PROVENTIL) (2.5 MG/3ML) 0.083% nebulizer solution 5 mg  5 mg Nebulization Once Pattricia Boss, MD   Stopped at 06/11/16 1717  . azithromycin (ZITHROMAX) 500 mg in dextrose 5 % 250 mL IVPB  500 mg Intravenous Q24H Romona Curls,  RPH   500 mg at 06/13/16 1112  . buPROPion [facility] SR) 12 hr tablet 150 mg  150 mg Oral Daily Rondel Jumbo, PA-C   150 mg at 06/13/16 1104  . ceFEPIme (MAXIPIME) 2 g in dextrose 5 % 50 mL IVPB  2 g Intravenous Q24H Norva Riffle, RPH   2 g at 06/12/16 1638  . Chlorhexidine Gluconate Cloth 2 % PADS 6 each  6 each Topical Q0600 Truett Mainland, DO   6 each at 06/13/16 0546  . clopidogrel (PLAVIX) tablet 75 mg  75 mg Oral Daily Rondel Jumbo, PA-C   75 mg at 06/13/16 1104  . docusate sodium (COLACE) capsule 200 mg  200 mg Oral TID Rondel Jumbo, PA-C   200 mg at 06/13/16 1105  . enalapril (VASOTEC) tablet 5 mg  5 mg Oral Daily Rondel Jumbo, PA-C   5 mg at 06/13/16 1104  . enoxaparin (LOVENOX) injection 40 mg  40 mg Subcutaneous Q24H Rondel Jumbo, PA-C   40 mg at 06/12/16 1955  . feeding supplement (PRO-STAT SUGAR FREE 64) liquid 30 mL  30 mL Oral BID Rondel Jumbo, PA-C   30 mL at 06/12/16 2156  . HYDROcodone-acetaminophen (NORCO/VICODIN) 5-325 MG per tablet 1-2 tablet  1-2 tablet Oral Q4H PRN Rondel Jumbo, PA-C      . ipratropium-albuterol (DUONEB) 0.5-2.5 (3) MG/3ML nebulizer solution 3 mL  3 mL Nebulization Q6H PRN Rondel Jumbo, PA-C      . meclizine (ANTIVERT) tablet 25 mg  25 mg Oral TID Rondel Jumbo, PA-C   25 mg at 06/13/16 1103  . MEDLINE mouth rinse  15 mL Mouth Rinse BID Maryann Mikhail, DO   15 mL at 06/13/16 1000  . mirtazapine (REMERON) tablet 7.5 mg  7.5 mg Oral QHS Rondel Jumbo, PA-C   7.5 mg at 06/12/16 2157  . mupirocin ointment (BACTROBAN) 2 % 1 application  1 application Nasal BID Truett Mainland, DO   1 application at [id number]  . olopatadine (PATANOL) 0.1 % ophthalmic solution 1 drop  1 drop Both Eyes Daily Rondel Jumbo, PA-C   1 drop at 06/13/16 1106  . ondansetron (ZOFRAN) tablet 4 mg  4 mg Oral Q6H PRN Rondel Jumbo, PA-C      . pantoprazole (PROTONIX) EC tablet 40 mg  40 mg Oral Q0600 Rondel Jumbo, PA-C   40 mg at 06/13/16 0544  . sertraline  (ZOLOFT) tablet 100 mg  100 mg Oral Daily Rondel Jumbo, PA-C   100 mg at 06/13/16 1104  . tamsulosin (FLOMAX) capsule 0.4 mg  0.4 mg Oral Daily Rondel Jumbo, PA-C   0.4 mg at 06/13/16 1105  . vancomycin (VANCOCIN) 500 mg in sodium chloride 0.9 % 100 mL IVPB  500 mg Intravenous Q12H Norva Riffle, RPH   500 mg at 06/13/16 0544     Discharge Medications: Please see discharge summary for a list of discharge medications.  Relevant Imaging Results:  Relevant Lab Results:   Additional Information SS#: [id number]  Candie Chroman, LCSW

## 2016-06-14 ENCOUNTER — Inpatient Hospital Stay (HOSPITAL_COMMUNITY): Payer: Medicare Other

## 2016-06-14 DIAGNOSIS — Z833 Family history of diabetes mellitus: Secondary | ICD-10-CM

## 2016-06-14 DIAGNOSIS — Z885 Allergy status to narcotic agent status: Secondary | ICD-10-CM

## 2016-06-14 DIAGNOSIS — Z8739 Personal history of other diseases of the musculoskeletal system and connective tissue: Secondary | ICD-10-CM

## 2016-06-14 DIAGNOSIS — R1310 Dysphagia, unspecified: Secondary | ICD-10-CM

## 2016-06-14 DIAGNOSIS — Z960 Presence of urogenital implants: Secondary | ICD-10-CM

## 2016-06-14 DIAGNOSIS — I10 Essential (primary) hypertension: Secondary | ICD-10-CM

## 2016-06-14 DIAGNOSIS — I69959 Hemiplegia and hemiparesis following unspecified cerebrovascular disease affecting unspecified side: Secondary | ICD-10-CM

## 2016-06-14 DIAGNOSIS — Z8546 Personal history of malignant neoplasm of prostate: Secondary | ICD-10-CM

## 2016-06-14 LAB — CBC AND DIFFERENTIAL
HEMATOCRIT: 27 % — AB (ref 41–53)
Hemoglobin: 8.6 g/dL — AB (ref 13.5–17.5)
PLATELETS: 142 10*3/uL — AB (ref 150–399)
WBC: 6.3 10*3/mL

## 2016-06-14 LAB — BASIC METABOLIC PANEL
ANION GAP: 8 (ref 5–15)
BUN: 20 mg/dL (ref 4–21)
BUN: 20 mg/dL (ref 6–20)
CALCIUM: 8.3 mg/dL — AB (ref 8.9–10.3)
CO2: 26 mmol/L (ref 22–32)
CREATININE: 0.6 mg/dL (ref 0.6–1.3)
Chloride: 103 mmol/L (ref 101–111)
Creatinine, Ser: 0.59 mg/dL — ABNORMAL LOW (ref 0.61–1.24)
GFR calc Af Amer: >60 mL/min (ref >60)
GLUCOSE: 187 mg/dL
GLUCOSE: 187 mg/dL — AB (ref 65–99)
Potassium: 3.7 mmol/L (ref 3.4–5.3)
Potassium: 3.7 mmol/L (ref 3.5–5.1)
SODIUM: 137 mmol/L (ref 135–145)
Sodium: 137 mmol/L (ref 137–147)

## 2016-06-14 LAB — GLUCOSE, CAPILLARY
GLUCOSE-CAPILLARY: 136 mg/dL — AB (ref 65–99)
Glucose-Capillary: 165 mg/dL — ABNORMAL HIGH (ref 65–99)
Glucose-Capillary: 120 mg/dL — ABNORMAL HIGH (ref 65–99)
Glucose-Capillary: 183 mg/dL — ABNORMAL HIGH (ref 65–99)

## 2016-06-14 LAB — CBC
HCT: 26.8 % — ABNORMAL LOW (ref 39.0–52.0)
Hemoglobin: 8.6 g/dL — ABNORMAL LOW (ref 13.0–17.0)
MCH: 26.7 pg (ref 26.0–34.0)
MCHC: 32.1 g/dL (ref 30.0–36.0)
MCV: 83.2 fL (ref 78.0–100.0)
PLATELETS: 142 10*3/uL — AB (ref 150–400)
RBC: 3.22 MIL/uL — AB (ref 4.22–5.81)
RDW: 16.4 % — AB (ref 11.5–15.5)
WBC: 6.3 10*3/uL (ref 4.0–10.5)

## 2016-06-14 LAB — STREP PNEUMONIAE URINARY ANTIGEN: Strep Pneumo Urinary Antigen: NEGATIVE

## 2016-06-14 LAB — VANCOMYCIN, TROUGH: VANCOMYCIN TR: 15 ug/mL (ref 15–20)

## 2016-06-14 MED ORDER — DEXTROSE 5 % IV SOLN
2.0000 g | Freq: Two times a day (BID) | INTRAVENOUS | Status: AC
  Administered 2016-06-14 – 2016-06-16 (×5): 2 g via INTRAVENOUS
  Filled 2016-06-14 (×7): qty 2

## 2016-06-14 NOTE — Progress Notes (Signed)
Pharmacy Antibiotic Note  Jeremiah Baldwin is a 70 y.o. male admitted on 06/11/2016 with pneumonia and sepsis.  Pharmacy has been consulted for Vancomycin, Cefepime, and Azithromycin dosing - Day #4 vanc, Day #3 azithro/cefepime. LA 3.58 >> 1.8, PCT 1.22, WBC down 14.1, afebrile. Found to have MRSE in 1/2 BCx, repeat BCx also 1/2 for CONS. ID consulted for recommendations. For 7 days of coverage for HCAP and 14 days for bacteremia.  -SCr improving to 0.87, CrCl~60. -vancomycin trough= 15 at ~ 7pm (last dose was given at ~ 5pm)  Plan: No vancomycin changes needed Cefepime to 2g IV q12h Will follow patient progress    Height: 6\' 2"  (188 cm) Weight: 107 lb 5.8 oz (48.7 kg) IBW/kg (Calculated) : 82.2  Temp (24hrs), Avg:98.2 F (36.8 C), Min:97.3 F (36.3 C), Max:98.6 F (37 C)   Recent Labs Lab 06/11/16 1243 06/11/16 1251 06/11/16 1642 06/11/16 1835 06/11/16 2103 06/12/16 0133 06/13/16 0023 06/14/16 1422 06/14/16 1854  WBC 22.9*  --   --  19.7*  --   --  14.1* 6.3  --   CREATININE 1.03  --   --  0.99  --   --  0.87 0.59*  --   LATICACIDVEN  --  3.56* 2.58* 2.7* 3.4* 1.8  --   --   --   VANCOTROUGH  --   --   --   --   --   --   --   --  15    Estimated Creatinine Clearance: 59.2 mL/min (by C-G formula based on SCr of 0.59 mg/dL (L)).    Allergies  Allergen Reactions  . Codeine Other (See Comments)    Listed on MAR 06/11/16- unknown reaction    Elicia Lamp, PharmD, BCPS Clinical Pharmacist 06/14/2016 8:15 PM

## 2016-06-14 NOTE — Care Management Important Message (Signed)
Important Message  Patient Details  Name: Jeremiah Baldwin MRN: [id number] Date of Birth: [birth year]   Medicare Important Message Given:  Yes    Zenon Mayo, RN 06/14/2016, 10:43 AMImportant Message  Patient Details  Name: Jeremiah Baldwin MRN: [id number] Date of Birth: [birth year]   Medicare Important Message Given:  Yes    Zenon Mayo, RN 06/14/2016, 10:43 AM

## 2016-06-14 NOTE — Progress Notes (Signed)
Pharmacy Antibiotic Note  Jeremiah Baldwin is a 70 y.o. male admitted on 06/11/2016 with pneumonia and sepsis.  Pharmacy has been consulted for Vancomycin, Cefepime, and Azithromycin dosing - Day #4 vanc, Day #3 azithro/cefepime. LA 3.58 >> 1.8, PCT 1.22, WBC down 14.1, afebrile. Found to have MRSE in 1/2 BCx, repeat BCx also 1/2 for CONS. ID consulted for recommendations.  SCr improving to 0.87, CrCl~54.  Plan: Vancomycin 500mg  IV q12h Cefepime to 2g IV q12h for improved renal function Azithromycin 500mg  IV q24h - change to PO vs. Enter stop date x 5 days? Watch renal fx Follow micro data, LOT, VT soon if continuing   Height: 6\' 2"  (188 cm) Weight: 107 lb 5.8 oz (48.7 kg) IBW/kg (Calculated) : 82.2  Temp (24hrs), Avg:98.5 F (36.9 C), Min:98.2 F (36.8 C), Max:99.1 F (37.3 C)   Recent Labs Lab 06/11/16 1243 06/11/16 1251 06/11/16 1642 06/11/16 1835 06/11/16 2103 06/12/16 0133 06/13/16 0023  WBC 22.9*  --   --  19.7*  --   --  14.1*  CREATININE 1.03  --   --  0.99  --   --  0.87  LATICACIDVEN  --  3.56* 2.58* 2.7* 3.4* 1.8  --     Estimated Creatinine Clearance: 54.4 mL/min (by C-G formula based on SCr of 0.87 mg/dL).    Allergies  Allergen Reactions  . Codeine Other (See Comments)    Listed on MAR 06/11/16- unknown reaction    Elicia Lamp, PharmD, BCPS Clinical Pharmacist 06/14/2016 1:18 PM

## 2016-06-14 NOTE — Progress Notes (Signed)
PROGRESS NOTE  Jeremiah Baldwin [id number] DOB: 1946-04-03 DOA: 06/11/2016 PCP: No primary care provider on file.   LOS: 3 days   Brief Narrative: 19 year oldHistory of CAD, left BKA, CVA with left-sided weakness, presented with respiratory distress and hypoxia. Patient being treated for sepsis secondary to pneumonia and bacteremia.  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 lactic acid was up to 3.4 - WBC improving, lactic acid 1.8 - Left lower lobe atelectasis or pneumonia - Continue vancomycin, cefepime, azithromycin - Influenza PCR negative - Blood cultures 06/11/2016: 1/2 Coag negative staph. Staph aureus negative. Repeat cultures on 11/21 with GPC so concern that this may be real. Discussed with ID, appreciate consult.  Acute hypoxic respiratory failure - Upon admission, oxygen saturations are to be in the 70s - Possibly secondary to pneumonia - Continue supplemental oxygen to maintain saturations above 92% - Continue neb treatments, antibiotics  Diabetes mellitus, type II - Metformin held, continue insulin sliding scale  History of of CVA with left-sided residual weakness - Continue Plavix  Depression - Continue Zoloft, Wellbutrin  GERD  - Continue PPI  ?dysphagia vs aspiration - Speech consulted and appreciated - had modified barium swallow: dysphagia 3, thin liquid  Normocytic anemia - baseline hemoglobin 11, currently 9.5 (drop likely dilutional) - Continue to monitor CBC   DVT prophylaxis: lovenox Code Status: DNR Family Communication: no family  bedside Disposition Plan: TBD  Consultants:   ID  Procedures:   None   Antimicrobials:  vanc / Cefepime / zithromax 11/21 >>   Subjective: - non verbal, alert and following commands.   Objective: Vitals:   06/14/16 0302 06/14/16 0700 06/14/16 0800 06/14/16 1009  BP: (!) 97/58 99/63 102/62 (!) 152/80  Pulse: 81 77 79   Resp: 19 17 18    Temp: 98.6 F (37 C) 98.2 F (36.8 C)    TempSrc: Axillary Oral    SpO2: 96% 92% 96%   Weight:      Height:        Intake/Output Summary (Last 24 hours) at 06/14/16 1044 Last data filed at 06/14/16 0800  Gross per 24 hour  Intake              660 ml  Output             1225 ml  Net             -565 ml   Filed Weights   06/11/16 1831  Weight: 48.7 kg (107 lb 5.8 oz)    Examination: Constitutional: cachectic  Vitals:   06/14/16 0302 06/14/16 0700 06/14/16 0800 06/14/16 1009  BP: (!) 97/58 99/63 102/62 (!) 152/80  Pulse: 81 77 79   Resp: 19 17 18    Temp: 98.6 F (37 C) 98.2 F (36.8 C)    TempSrc: Axillary Oral    SpO2: 96% 92% 96%   Weight:      Height:       ENMT: Mucous membranes are moist.  Respiratory: clear to auscultation bilaterally, no wheezing, no crackles. Normal respiratory effort.  Cardiovascular: Regular rate and rhythm, no murmurs / rubs / gallops. No  LE edema.   Abdomen: no tenderness. Bowel sounds positive.  Musculoskeletal: no clubbing / cyanosis. Left AKA Skin: sacral pressure injury without skin breakdown Neurologic: non focal  Psychiatric: flat   Data Reviewed: I have personally reviewed following labs and imaging studies  CBC:  Recent Labs Lab 06/11/16 1243 06/11/16 1835 06/13/16 0023  WBC 22.9* 19.7* 14.1*  NEUTROABS 19.3* 17.9*  --   HGB 11.3* 11.4* 9.5*  HCT 35.6* 35.2* 29.5*  MCV 84.2 82.4 81.7  PLT 292 241 [id number]   Basic Metabolic Panel:  Recent Labs Lab 06/11/16 1243 06/11/16 1835 06/13/16 0023  NA 140 137 133*  K 4.2 4.3 3.8  CL 104 101 101  CO2 23 23 23   GLUCOSE 290*  279* 205*  BUN 29* 32* 28*  CREATININE 1.03 0.99 0.87  CALCIUM 9.2 9.2 8.5*   GFR: Estimated Creatinine Clearance: 54.4 mL/min (by C-G formula based on SCr of 0.87 mg/dL). Liver Function Tests:  Recent Labs Lab 06/11/16 1243 06/11/16 1835  AST 18 18  ALT 9* 10*  ALKPHOS 97 97  BILITOT 0.8 0.7  PROT 6.7 7.0  ALBUMIN 3.1* 3.1*   No results for input(s): LIPASE, AMYLASE in the last 168 hours. No results for input(s): AMMONIA in the last 168 hours. Coagulation Profile:  Recent Labs Lab 06/11/16 1835  INR 1.14   Cardiac Enzymes:  Recent Labs Lab 06/11/16 1243 06/11/16 1835  TROPONINI 0.10* <0.03   BNP (last 3 results) No results for input(s): PROBNP in the last 8760 hours. HbA1C: No results for input(s): HGBA1C in the last 72 hours. CBG:  Recent Labs Lab 06/12/16 2035 06/13/16 1648 06/13/16 2118 06/14/16 0724  GLUCAP 232* 119* 176* 136*   Lipid Profile: No results for input(s): CHOL, HDL, LDLCALC, TRIG, CHOLHDL, LDLDIRECT in the last 72 hours. Thyroid Function Tests: No results for input(s): TSH, T4TOTAL, FREET4, T3FREE, THYROIDAB in the last 72 hours. Anemia Panel: No results for input(s): VITAMINB12, FOLATE, FERRITIN, TIBC, IRON, RETICCTPCT in the last 72 hours. Urine analysis:    Component Value Date/Time   COLORURINE YELLOW 06/11/2016 1701   APPEARANCEUR CLOUDY (A) 06/11/2016 1701   LABSPEC 1.018 06/11/2016 1701   PHURINE 5.5 06/11/2016 1701   GLUCOSEU 250 (A) 06/11/2016 1701   HGBUR SMALL (A) 06/11/2016 1701   BILIRUBINUR NEGATIVE 06/11/2016 1701   KETONESUR NEGATIVE 06/11/2016 1701   PROTEINUR NEGATIVE 06/11/2016 1701   NITRITE NEGATIVE 06/11/2016 1701   LEUKOCYTESUR LARGE (A) 06/11/2016 1701   Sepsis Labs: Invalid input(s): PROCALCITONIN, LACTICIDVEN  Recent Results (from the past 240 hour(s))  Culture, blood (x 2)     Status: Abnormal (Preliminary result)   Collection Time: 06/11/16 12:15 PM  Result Value Ref Range Status   Specimen  Description BLOOD RIGHT WRIST  Final   Special Requests BOTTLES DRAWN AEROBIC AND ANAEROBIC 5CC  Final   Culture  Setup Time   Final    GRAM POSITIVE COCCI IN CLUSTERS AEROBIC BOTTLE ONLY CRITICAL RESULT CALLED TO, READ BACK BY AND VERIFIED WITH: A. ANDERSON, PHARMD AT 1330 ON 06/12/16 BY C. JESSUP, MLT.    Culture (A)  Final    STAPHYLOCOCCUS SPECIES (COAGULASE NEGATIVE) THE SIGNIFICANCE OF ISOLATING THIS ORGANISM FROM A SINGLE VENIPUNCTURE CANNOT BE PREDICTED WITHOUT FURTHER CLINICAL AND CULTURE CORRELATION. SUSCEPTIBILITIES AVAILABLE ONLY ON REQUEST.    Report Status PENDING  Incomplete  Blood Culture ID Panel (Reflexed)     Status: Abnormal   Collection Time: 06/11/16 12:15 PM  Result Value Ref Range Status  Enterococcus species NOT DETECTED NOT DETECTED Final   Listeria monocytogenes NOT DETECTED NOT DETECTED Final   Staphylococcus species DETECTED (A) NOT DETECTED Final    Comment: CRITICAL RESULT CALLED TO, READ BACK BY AND VERIFIED WITH: A. ANDERSON, PHARMD AT 1330 ON 06/12/16 BY C. JESSUP, MLT.    Staphylococcus aureus NOT DETECTED NOT DETECTED Final   Methicillin resistance DETECTED (A) NOT DETECTED Final    Comment: CRITICAL RESULT CALLED TO, READ BACK BY AND VERIFIED WITH: A. ANDERSON, PHARMD AT 1330 ON 06/12/16 BY C. JESSUP, MLT.    Streptococcus species NOT DETECTED NOT DETECTED Final   Streptococcus agalactiae NOT DETECTED NOT DETECTED Final   Streptococcus pneumoniae NOT DETECTED NOT DETECTED Final   Streptococcus pyogenes NOT DETECTED NOT DETECTED Final   Acinetobacter baumannii NOT DETECTED NOT DETECTED Final   Enterobacteriaceae species NOT DETECTED NOT DETECTED Final   Enterobacter cloacae complex NOT DETECTED NOT DETECTED Final   Escherichia coli NOT DETECTED NOT DETECTED Final   Klebsiella oxytoca NOT DETECTED NOT DETECTED Final   Klebsiella pneumoniae NOT DETECTED NOT DETECTED Final   Proteus species NOT DETECTED NOT DETECTED Final   Serratia marcescens  NOT DETECTED NOT DETECTED Final   Haemophilus influenzae NOT DETECTED NOT DETECTED Final   Neisseria meningitidis NOT DETECTED NOT DETECTED Final   Pseudomonas aeruginosa NOT DETECTED NOT DETECTED Final   Candida albicans NOT DETECTED NOT DETECTED Final   Candida glabrata NOT DETECTED NOT DETECTED Final   Candida krusei NOT DETECTED NOT DETECTED Final   Candida parapsilosis NOT DETECTED NOT DETECTED Final   Candida tropicalis NOT DETECTED NOT DETECTED Final  Urine culture     Status: Abnormal   Collection Time: 06/11/16  5:02 PM  Result Value Ref Range Status   Specimen Description URINE, CATHETERIZED  Final   Special Requests NONE  Final   Culture >=100,000 COLONIES/mL YEAST (A)  Final   Report Status 06/13/2016 FINAL  Final  MRSA PCR Screening     Status: Abnormal   Collection Time: 06/11/16  6:30 PM  Result Value Ref Range Status   MRSA by PCR POSITIVE (A) NEGATIVE Final    Comment:        The GeneXpert MRSA Assay (FDA approved for NASAL specimens only), is one component of a comprehensive MRSA colonization surveillance program. It is not intended to diagnose MRSA infection nor to guide or monitor treatment for MRSA infections. RESULT CALLED TO, READ BACK BY AND VERIFIED WITH: BALDWIN,O RN 06/11/16 AT 2030 SKEEN,P   Culture, blood (x 2)     Status: None (Preliminary result)   Collection Time: 06/11/16  6:35 PM  Result Value Ref Range Status   Specimen Description BLOOD RIGHT ARM  Final   Special Requests BOTTLES DRAWN AEROBIC AND ANAEROBIC 5ML EACH  Final   Culture NO GROWTH 2 DAYS  Final   Report Status PENDING  Incomplete  Culture, blood (routine x 2)     Status: None (Preliminary result)   Collection Time: 06/13/16 12:17 AM  Result Value Ref Range Status   Specimen Description BLOOD RIGHT ANTECUBITAL  Final   Special Requests IN PEDIATRIC BOTTLE 1CC  Final   Culture NO GROWTH < 12 HOURS  Final   Report Status PENDING  Incomplete  Culture, blood (routine x 2)      Status: None (Preliminary result)   Collection Time: 06/13/16 12:22 AM  Result Value Ref Range Status   Specimen Description BLOOD LEFT HAND  Final   Special Requests IN PEDIATRIC  BOTTLE 1CC  Final   Culture  Setup Time   Final    GRAM POSITIVE COCCI IN CLUSTERS AEROBIC BOTTLE ONLY CRITICAL VALUE NOTED.  VALUE IS CONSISTENT WITH PREVIOUSLY REPORTED AND CALLED VALUE.    Culture [facility] POSITIVE COCCI  Final   Report Status PENDING  Incomplete      Radiology Studies: Dg Swallowing Func-speech Pathology  Result Date: 06/13/2016 Objective Swallowing Evaluation: Type of Study: MBS-Modified Barium Swallow Study Patient Details Name: Jeremiah Baldwin MRN: [id number] Date of Birth: 1946-07-23 Today's Date: 06/13/2016 Time: SLP Start Time (ACUTE ONLY): 0930-SLP Stop Time (ACUTE ONLY): 0950 SLP Time Calculation (min) (ACUTE ONLY): 20 min Past Medical History: Past Medical History: Diagnosis Date . Allergy  . Anorexia  . CAD (coronary artery disease)  . Colon polyp  . Diabetes mellitus without complication ([place])  . Diabetic retinopathy ([place])  . GERD (gastroesophageal reflux disease)  . Hyperlipidemia  . Hypertension  . Left hemiparesis ([place])  . PVD (peripheral vascular disease) ([place])  . Stroke [facility])  Past Surgical History: Past Surgical History: Procedure Laterality Date . enteroscopic laser photocoagulation Left 2001 . left bka   HPI: 70 y.o. male from SNF admitted with PNA and Sepsis. Pt with hx of CVA with left hemiparesis, L BKA, CAD, Anorexia, DM, Retinopathy, HTN, and PVD.  Subjective: alert, poor spontaneity, limited speech today Assessment / Plan / Recommendation CHL IP CLINICAL IMPRESSIONS 06/13/2016 Therapy Diagnosis Moderate oral phase dysphagia Clinical Impression Pt presented with a primarily oral dysphagia with excessive oral holding of POs, requiring moderate cueing to initiate mastication/oral movement.  The swallow was delayed to the pyriform sinuses for puree and thin liquids, but once triggered,  pharyngeal clearance was normal and there was adequate airway protection.  (No penetration nor aspiration noted, despite consumption of large, successive thin liquid boluses.)  Pt's performance during MBS is improved since clinical assessment yesterday.  Recommend advancing liquids back to thins, maintain dysphagia 3 consistency.  SLP will follow to ensure functional toleration of this diet.  Impact on safety and function Moderate aspiration risk   CHL IP TREATMENT RECOMMENDATION 06/12/2016 Treatment Recommendations Therapy as outlined in treatment plan below   Prognosis 06/13/2016 Prognosis for Safe Diet Advancement Fair Barriers to Reach Goals Cognitive deficits Barriers/Prognosis Comment -- CHL IP DIET RECOMMENDATION 06/13/2016 SLP Diet Recommendations Dysphagia 3 (Mech soft) solids;Thin liquid Liquid Administration via Cup;Straw Medication Administration Whole meds with puree Compensations Slow rate;Small sips/bites;Monitor for anterior loss Postural Changes --   CHL IP OTHER RECOMMENDATIONS 06/13/2016 Recommended Consults -- Oral Care Recommendations Oral care BID Other Recommendations --   CHL IP FOLLOW UP RECOMMENDATIONS 06/13/2016 Follow up Recommendations Skilled Nursing facility   [facility] IP FREQUENCY AND DURATION 06/13/2016 Speech Therapy Frequency (ACUTE ONLY) min 3x week Treatment Duration 1 week      CHL IP ORAL PHASE 06/13/2016 Oral Phase Impaired Oral - Pudding Teaspoon -- Oral - Pudding Cup -- Oral - Honey Teaspoon -- Oral - Honey Cup -- Oral - Nectar Teaspoon -- Oral - Nectar Cup -- Oral - Nectar Straw -- Oral - Thin Teaspoon -- Oral - Thin Cup -- Oral - Thin Straw Right anterior bolus loss;Weak lingual manipulation;Holding of bolus;Delayed oral transit;Decreased bolus cohesion Oral - Puree Weak lingual manipulation;Holding of bolus;Delayed oral transit;Decreased bolus cohesion Oral - Mech Soft -- Oral - Regular -- Oral - Multi-Consistency -- Oral - Pill -- Oral Phase - Comment --  CHL IP PHARYNGEAL  PHASE 06/13/2016 Pharyngeal Phase Impaired Pharyngeal- Pudding Teaspoon -- Pharyngeal --  Pharyngeal- Pudding Cup -- Pharyngeal -- Pharyngeal- Honey Teaspoon -- Pharyngeal -- Pharyngeal- Honey Cup -- Pharyngeal -- Pharyngeal- Nectar Teaspoon -- Pharyngeal -- Pharyngeal- Nectar Cup -- Pharyngeal -- Pharyngeal- Nectar Straw -- Pharyngeal -- Pharyngeal- Thin Teaspoon -- Pharyngeal -- Pharyngeal- Thin Cup -- Pharyngeal -- Pharyngeal- Thin Straw Delayed swallow initiation-pyriform sinuses Pharyngeal -- Pharyngeal- Puree Delayed swallow initiation-pyriform sinuses Pharyngeal -- Pharyngeal- Mechanical Soft -- Pharyngeal -- Pharyngeal- Regular -- Pharyngeal -- Pharyngeal- Multi-consistency -- Pharyngeal -- Pharyngeal- Pill -- Pharyngeal -- Pharyngeal Comment --  No flowsheet data found. No flowsheet data found. [name] 06/13/2016, 10:08 AM                Scheduled Meds: . albuterol  5 mg Nebulization Once  . azithromycin  500 mg Intravenous Q24H  . buPROPion  150 mg Oral Daily  . ceFEPime (MAXIPIME) IV  2 g Intravenous Q24H  . Chlorhexidine Gluconate Cloth  6 each Topical Q0600  . clopidogrel  75 mg Oral Daily  . docusate sodium  200 mg Oral TID  . enalapril  5 mg Oral Daily  . enoxaparin (LOVENOX) injection  40 mg Subcutaneous Q24H  . feeding supplement (PRO-STAT SUGAR FREE 64)  30 mL Oral BID  . insulin aspart  0-9 Units Subcutaneous TID WC  . meclizine  25 mg Oral TID  . mouth rinse  15 mL Mouth Rinse BID  . mirtazapine  7.5 mg Oral QHS  . mupirocin ointment  1 application Nasal BID  . olopatadine  1 drop Both Eyes Daily  . pantoprazole  40 mg Oral Q0600  . sertraline  100 mg Oral Daily  . tamsulosin  0.4 mg Oral Daily  . vancomycin  500 mg Intravenous Q12H   Continuous Infusions:   Marzetta Board, MD, PhD [facility] Pager [phone]  If 7PM-7AM, please contact night-coverage [email or web address] Password [facility] 06/14/2016, 10:44 AM

## 2016-06-14 NOTE — Evaluation (Signed)
Clinical/Bedside Swallow Evaluation Patient Details  Name: Jeremiah Baldwin MRN: [id number] Date of Birth: [birth year]  Today's Date: 06/14/2016 Time: SLP Start Time (ACUTE ONLY): 1 SLP Stop Time (ACUTE ONLY): 1106 SLP Time Calculation (min) (ACUTE ONLY): 26 min  Past Medical History:  Past Medical History:  Diagnosis Date  . Allergy   . Anorexia   . CAD (coronary artery disease)   . Colon polyp   . Diabetes mellitus without complication ([place])   . Diabetic retinopathy ([place])   . GERD (gastroesophageal reflux disease)   . Hyperlipidemia   . Hypertension   . Left hemiparesis ([place])   . PVD (peripheral vascular disease) ([place])   . Stroke [facility])    Past Surgical History:  Past Surgical History:  Procedure Laterality Date  . enteroscopic laser photocoagulation Left 2001  . left bka     HPI:  70 y.o. male from SNF admitted with PNA and Sepsis. Pt with hx of CVA with left hemiparesis, L BKA, CAD, Anorexia, DM, Retinopathy, HTN, and PVD. Pt had MBS 11/21 wtih recomendations for regular diet and thin liquids; however SLP was re-ordered the next day due to frequent coughing with thin liquids observed by RN.   Assessment / Plan / Recommendation Clinical Impression  Pt was recently d/c from SLP services due to tolerating a regular diet and thin liquids. MBS from previous date showed no events of aspiration during thin liquid trials, but RN and wife report immediate coughing on thin liquids. Wife present at time of the evaluation, aided in feeding him breakfast. Prolonged mastication was observed with minimal trials of regular textures. Immediate coughing seen following thin liquid trials to aid in oral clearance of solid food. Additional boluses of thin liquid were offered and coughing persisted. Nectar thick trials showed no s/s of aspiration, but required Min cues for oral holding. SLP provided education for diet modifications for a softer diet and nectar thick liquids. Wife would like to  continue with regular textures, but agreed to downgrade to nectar thick liquids for hospital stay. Will continue to follow for diet tolerance and education.    Aspiration Risk  Moderate aspiration risk    Diet Recommendation Regular;Nectar-thick liquid   Liquid Administration via: Straw;Cup Medication Administration: Whole meds with puree Supervision: Full supervision/cueing for compensatory strategies;Staff to assist with self feeding Compensations: Minimize environmental distractions;Slow rate;Small sips/bites Postural Changes: Remain upright for at least 30 minutes after po intake;Seated upright at 90 degrees    Other  Recommendations Oral Care Recommendations: Oral care BID Other Recommendations: Order thickener from pharmacy;Prohibited food (jello, ice cream, thin soups);Remove water pitcher   Follow up Recommendations Skilled Nursing facility      Frequency and Duration min 2x/week  2 weeks       Prognosis Prognosis for Safe Diet Advancement: Good Barriers to Reach Goals: Cognitive deficits      Swallow Study   General HPI: 70 y.o. male from SNF admitted with PNA and Sepsis. Pt with hx of CVA with left hemiparesis, L BKA, CAD, Anorexia, DM, Retinopathy, HTN, and PVD. Pt had MBS 11/21 wtih recomendations for regular diet and thin liquids; however SLP was re-ordered the next day due to frequent coughing with thin liquids observed by RN. Type of Study: Bedside Swallow Evaluation Previous Swallow Assessment: BSE on 11/20 (Dys 3, nectar), MBS 11/21 recommending regular diet and thin liquids Diet Prior to this Study: Regular;Thin liquids Temperature Spikes Noted: No Respiratory Status: Nasal cannula History of Recent Intubation: No Behavior/Cognition: Alert;Cooperative Oral Care  Completed by SLP: No Oral Cavity - Dentition: Missing dentition Self-Feeding Abilities: Needs assist;Able to feed self;Needs set up Patient Positioning: Upright in bed Baseline Vocal Quality:  Hoarse;Low vocal intensity Volitional Cough: Weak    Oral/Motor/Sensory Function Overall Oral Motor/Sensory Function: Mild impairment Facial Symmetry: Abnormal symmetry left   Ice Chips Ice chips: Not tested   Thin Liquid Thin Liquid: Impaired Presentation: Straw Oral Phase Functional Implications: Oral holding Pharyngeal  Phase Impairments: Cough - Immediate    Nectar Thick Nectar Thick Liquid: Impaired Presentation: Straw Oral phase functional implications: Oral holding   Honey Thick Honey Thick Liquid: Not tested   Puree Puree: Not tested   Solid   GO   Solid: Impaired Oral Phase Impairments: Impaired mastication       Brealynn Contino Jiles Prows, Student SLP  Shela Leff 06/14/2016,12:33 PM

## 2016-06-14 NOTE — Consult Note (Signed)
[facility] for Infectious Disease  Total days of antibiotics 4        Day 4 azithromycin        Day 3 cefepime        Day 4 vancomycin       Reason for Consult: CoNS bacteremia, pneumonia   Referring Physician: gherghe  Active Problems:   Sepsis ([place])   Pressure injury of skin    HPI: Jeremiah Baldwin is a 70 y.o. 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 with sinus tachycardia with worsening cough.  WBC of 22.9K, with bandemia, LA 3.56. He was started on vanco, cefepime for hcap coverage and azithromycin atypical coverage. cxr seen with right infiltrate with possible Right sided effusion. Infectious work showed blood cx + CoNS/MRSE on admit and on repeat cx on 11/21, though only 1 of 2 sets on each day, concerning for persistent bacteremia, possibly endocarditis. He also has remote hx of osteomyelitis of right foot for which he declined getting bka in the past. His wife reports he has poor intake, lack of appetite to explain his poor nutritional status. Patient is recently foley dependent since his last hosp admission a few months ago for uti related sepsis  Past Medical History:  Diagnosis Date  . Allergy   . Anorexia   . CAD (coronary artery disease)   . Colon polyp   . Diabetes mellitus without complication ([place])   . Diabetic retinopathy ([place])   . GERD (gastroesophageal reflux disease)   . Hyperlipidemia   . Hypertension   . Left hemiparesis ([place])   . PVD (peripheral vascular disease) ([place])   . Stroke [facility])     Allergies:  Allergies  Allergen Reactions  . Codeine Other (See Comments)    Listed on [facility] 06/11/16- unknown reaction    MEDICATIONS: . albuterol  5 mg Nebulization Once  . azithromycin  500 mg Intravenous Q24H  .  buPROPion  150 mg Oral Daily  . ceFEPime (MAXIPIME) IV  2 g Intravenous Q24H  . Chlorhexidine Gluconate Cloth  6 each Topical Q0600  . clopidogrel  75 mg Oral Daily  . docusate sodium  200 mg Oral TID  . enalapril  5 mg Oral Daily  . enoxaparin (LOVENOX) injection  40 mg Subcutaneous Q24H  . feeding supplement (PRO-STAT SUGAR FREE 64)  30 mL Oral BID  . insulin aspart  0-9 Units Subcutaneous TID WC  . meclizine  25 mg Oral TID  . mouth rinse  15 mL Mouth Rinse BID  . mirtazapine  7.5 mg Oral QHS  . mupirocin ointment  1 application Nasal BID  . olopatadine  1 drop Both Eyes Daily  . pantoprazole  40 mg Oral Q0600  . sertraline  100 mg Oral Daily  . tamsulosin  0.4 mg Oral Daily  . vancomycin  500 mg Intravenous Q12H    Social History  Substance Use Topics  . Smoking status: Never Smoker  . Smokeless tobacco: Never Used  . Alcohol use Not on file    Family History  Problem Relation Age of Onset  . Diabetes Mother     Review of Systems - limited due to not verbalizing/aphasia from stroke  Constitutional: Negative for fever, chills, diaphoresis, activity change,  appetite change, fatigue and unexpected weight change.  HENT: Negative for congestion, sore throat, rhinorrhea, sneezing, trouble swallowing and sinus pressure.  Eyes: Negative for photophobia and visual disturbance.  Respiratory: positive for cough, chest tightness, shortness of breath, wheezing and stridor.  Cardiovascular: Negative for chest pain, palpitations and leg swelling.  Gastrointestinal: Negative for nausea, vomiting, abdominal pain, diarrhea, constipation, blood in stool, abdominal distention and anal bleeding.  Genitourinary: Negative for dysuria, hematuria, flank pain and difficulty urinating.  Musculoskeletal: Negative for myalgias, back pain, joint swelling, arthralgias and gait problem.  Skin: Negative for color change, pallor, rash and wound.  Neurological: Negative for dizziness, tremors, weakness  and light-headedness.  Hematological: Negative for adenopathy. Does not bruise/bleed easily.  Psychiatric/Behavioral: Negative for behavioral problems, confusion, sleep disturbance, dysphoric mood, decreased concentration and agitation.     OBJECTIVE: Temp:  [97.6 F (36.4 C)-99.1 F (37.3 C)] 98.2 F (36.8 C) (11/22 0700) Pulse Rate:  [61-103] 79 (11/22 0800) Resp:  [14-30] 18 (11/22 0800) BP: (92-152)/(58-80) 152/80 (11/22 1009) SpO2:  [91 %-98 %] 96 % (11/22 0800) Physical Exam  Constitutional: He is oriented to person. He appears chronically ill, malnourished, older than stated age. No distress.  HENT: dry mucosa, poor dentition Mouth/Throat: Oropharynx is clear and moist. No oropharyngeal exudate.  Cardiovascular: Normal rate, regular rhythm and normal heart sounds. Exam reveals no gallop and no friction rub. No murmur heard.  Pulmonary/Chest: Effort normal and breath sounds normal. No respiratory distress. Mild exp rhonchi. Prominent rib markings Abdominal: Soft. Scaphoid,Bowel sounds are normal. He exhibits no distension. There is no tenderness.  Lymphadenopathy:  He has no cervical adenopathy.  Neurological: He is alert and oriented to person. Left side contracture and weakness from CVA. Right arm 5/5 strength, CN2-12 intact Skin: Skin is very dry and scaly on right foot, signs of onychomycosis. No open lesion Psychiatric:flat affect    LABS: Results for orders placed or performed during the hospital encounter of 06/11/16 (from the past 48 hour(s))  Glucose, capillary     Status: Abnormal   Collection Time: 06/12/16  8:35 PM  Result Value Ref Range   Glucose-Capillary 232 (H) 65 - 99 mg/dL  Culture, blood (routine x 2)     Status: None (Preliminary result)   Collection Time: 06/13/16 12:17 AM  Result Value Ref Range   Specimen Description BLOOD RIGHT ANTECUBITAL    Special Requests IN PEDIATRIC BOTTLE 1CC    Culture NO GROWTH < 12 HOURS    Report Status PENDING     Culture, blood (routine x 2)     Status: None (Preliminary result)   Collection Time: 06/13/16 12:22 AM  Result Value Ref Range   Specimen Description BLOOD LEFT HAND    Special Requests IN PEDIATRIC BOTTLE 1CC    Culture  Setup Time      GRAM POSITIVE COCCI IN CLUSTERS AEROBIC BOTTLE ONLY CRITICAL VALUE NOTED.  VALUE IS CONSISTENT WITH PREVIOUSLY REPORTED AND CALLED VALUE.    Culture GRAM POSITIVE COCCI    Report Status PENDING   CBC     Status: Abnormal   Collection Time: 06/13/16 12:23 AM  Result Value Ref Range   WBC 14.1 (H) 4.0 - 10.5 K/uL   RBC 3.61 (L) 4.22 - 5.81 MIL/uL   Hemoglobin 9.5 (L) 13.0 - 17.0 g/dL   HCT 29.5 (L) 39.0 - 52.0 %   MCV 81.7 78.0 - 100.0 fL   MCH 26.3 26.0 - 34.0 pg   MCHC 32.2 30.0 - 36.0  g/dL   RDW 16.3 (H) 11.5 - 15.5 %   Platelets 189 150 - 400 K/uL  Basic metabolic panel     Status: Abnormal   Collection Time: 06/13/16 12:23 AM  Result Value Ref Range   Sodium 133 (L) 135 - 145 mmol/L   Potassium 3.8 3.5 - 5.1 mmol/L   Chloride 101 101 - 111 mmol/L   CO2 23 22 - 32 mmol/L   Glucose, Bld 205 (H) 65 - 99 mg/dL   BUN 28 (H) 6 - 20 mg/dL   Creatinine, Ser 0.87 0.61 - 1.24 mg/dL   Calcium 8.5 (L) 8.9 - 10.3 mg/dL   GFR calc non Af Amer >60 >60 mL/min   GFR calc Af Amer >60 >60 mL/min    Comment: (NOTE) The eGFR has been calculated using the CKD EPI equation. This calculation has not been validated in all clinical situations. eGFR's persistently <60 mL/min signify possible Chronic Kidney Disease.    Anion gap 9 5 - 15  Glucose, capillary     Status: Abnormal   Collection Time: 06/13/16  4:48 PM  Result Value Ref Range   Glucose-Capillary 119 (H) 65 - 99 mg/dL  Glucose, capillary     Status: Abnormal   Collection Time: 06/13/16  9:18 PM  Result Value Ref Range   Glucose-Capillary 176 (H) 65 - 99 mg/dL  Glucose, capillary     Status: Abnormal   Collection Time: 06/14/16  7:24 AM  Result Value Ref Range   Glucose-Capillary 136 (H) 65  - 99 mg/dL    MICRO: 11/21 blood x 1 of 4 bottles + GPc 11/19 blood cx 1 36f4 bottles NGTD  IMAGING: Dg Swallowing Func-speech Pathology  Result Date: 06/13/2016 Objective Swallowing Evaluation: Type of Study: MBS-Modified Barium Swallow Study Patient Details Name: HGaspare NetzelMRN: [id number] of Birth: [id number]'s Date: 06/13/2016 Time: SLP Start Time (ACUTE ONLY): 0930-SLP Stop Time (ACUTE ONLY): 0950 SLP Time Calculation (min) (ACUTE ONLY): 20 min Past Medical History: Past Medical History: Diagnosis Date . Allergy  . Anorexia  . CAD (coronary artery disease)  . Colon polyp  . Diabetes mellitus without complication ([place]  . Diabetic retinopathy ([place]  . GERD (gastroesophageal reflux disease)  . Hyperlipidemia  . Hypertension  . Left hemiparesis ([place]  . PVD (peripheral vascular disease) ([place]  . Stroke ([facility]  Past Surgical History: Past Surgical History: Procedure Laterality Date . enteroscopic laser photocoagulation Left 2001 . left bka   HPI: 70y.o. male from SNF admitted with PNA and Sepsis. Pt with hx of CVA with left hemiparesis, L BKA, CAD, Anorexia, DM, Retinopathy, HTN, and PVD.  Subjective: alert, poor spontaneity, limited speech today Assessment / Plan / Recommendation [facility] IP CLINICAL IMPRESSIONS 06/13/2016 Therapy Diagnosis Moderate oral phase dysphagia Clinical Impression Pt presented with a primarily oral dysphagia with excessive oral holding of POs, requiring moderate cueing to initiate mastication/oral movement.  The swallow was delayed to the pyriform sinuses for puree and thin liquids, but once triggered, pharyngeal clearance was normal and there was adequate airway protection.  (No penetration nor aspiration noted, despite consumption of large, successive thin liquid boluses.)  Pt's performance during MBS is improved since clinical assessment yesterday.  Recommend advancing liquids back to thins, maintain dysphagia 3 consistency.  SLP will follow to ensure functional  toleration of this diet.  Impact on safety and function Moderate aspiration risk   [facility] IP TREATMENT RECOMMENDATION 06/12/2016 Treatment Recommendations Therapy as outlined in treatment plan below  Prognosis 06/13/2016 Prognosis for Safe Diet Advancement Fair Barriers to Reach Goals Cognitive deficits Barriers/Prognosis Comment -- [facility] IP DIET RECOMMENDATION 06/13/2016 SLP Diet Recommendations Dysphagia 3 (Mech soft) solids;Thin liquid Liquid Administration via Cup;Straw Medication Administration Whole meds with puree Compensations Slow rate;Small sips/bites;Monitor for anterior loss Postural Changes --   [facility] IP OTHER RECOMMENDATIONS 06/13/2016 Recommended Consults -- Oral Care Recommendations Oral care BID Other Recommendations --   [facility] IP FOLLOW UP RECOMMENDATIONS 06/13/2016 Follow up Recommendations Skilled Nursing facility   [facility] IP FREQUENCY AND DURATION 06/13/2016 Speech Therapy Frequency (ACUTE ONLY) min 3x week Treatment Duration 1 week      [facility] IP ORAL PHASE 06/13/2016 Oral Phase Impaired Oral - Pudding Teaspoon -- Oral - Pudding Cup -- Oral - Honey Teaspoon -- Oral - Honey Cup -- Oral - Nectar Teaspoon -- Oral - Nectar Cup -- Oral - Nectar Straw -- Oral - Thin Teaspoon -- Oral - Thin Cup -- Oral - Thin Straw Right anterior bolus loss;Weak lingual manipulation;Holding of bolus;Delayed oral transit;Decreased bolus cohesion Oral - Puree Weak lingual manipulation;Holding of bolus;Delayed oral transit;Decreased bolus cohesion Oral - Mech Soft -- Oral - Regular -- Oral - Multi-Consistency -- Oral - Pill -- Oral Phase - Comment --  CHL IP PHARYNGEAL PHASE 06/13/2016 Pharyngeal Phase Impaired Pharyngeal- Pudding Teaspoon -- Pharyngeal -- Pharyngeal- Pudding Cup -- Pharyngeal -- Pharyngeal- Honey Teaspoon -- Pharyngeal -- Pharyngeal- Honey Cup -- Pharyngeal -- Pharyngeal- Nectar Teaspoon -- Pharyngeal -- Pharyngeal- Nectar Cup -- Pharyngeal -- Pharyngeal- Nectar Straw -- Pharyngeal -- Pharyngeal- Thin Teaspoon --  Pharyngeal -- Pharyngeal- Thin Cup -- Pharyngeal -- Pharyngeal- Thin Straw Delayed swallow initiation-pyriform sinuses Pharyngeal -- Pharyngeal- Puree Delayed swallow initiation-pyriform sinuses Pharyngeal -- Pharyngeal- Mechanical Soft -- Pharyngeal -- Pharyngeal- Regular -- Pharyngeal -- Pharyngeal- Multi-consistency -- Pharyngeal -- Pharyngeal- Pill -- Pharyngeal -- Pharyngeal Comment --  No flowsheet data found. No flowsheet data found. [name] 06/13/2016, 10:08 AM              Assessment/Plan:  70yo M with hx of CVA with left sided weakness, left aka, PVD, admitted for sepsis thought to be HCAP, initial cxr was clear though subsequent had infiltrate. He was found to have bacteremia in only 1 of 4 bottles, still potentially could be related. He has marked severe protein-caloric malnutrition with BMI 14 and ongoing dysphagia  hcap = would treat 7 day with cefepime nad azithromycin. Will order urine test to see if can d/c azithromycin  Bacteremia = treat for a minimum of 14 days of vancomycin. TTE had poor visualization due to cachexia. Recommend TEE to rule out endocarditis. This will help with length of abtx therapy since we would want to not give him excess abtx  Hx of chronic osteo = would get plan films of right foot to see if evidence of osteo, would get sed rate and crp.  Health maintenance = will check hep c  DR [name] dAm to see over the long weekend. Will see back on monday

## 2016-06-14 NOTE — Progress Notes (Addendum)
Nutrition Follow-up  DOCUMENTATION CODES:   Severe malnutrition in context of chronic illness, Underweight  INTERVENTION:    Mighty Shake II TID with meals, each supplement provides 480-500 kcals and 20-23 grams of protein AND / OR  Magic cup TID with meals, each supplement provides 290 kcal and 9 grams of protein  NUTRITION DIAGNOSIS:   Malnutrition related to chronic illness as evidenced by severe depletion of muscle mass, severe depletion of body fat.  Ongoing  GOAL:   Patient will meet greater than or equal to 90% of their needs  Unmet  MONITOR:   PO intake, Supplement acceptance, Labs, Weight trends, I & O's, Skin  ASSESSMENT:   70 y.o. male history of CAD, left BKA secondary to osteomyelitis, CVA with left-sided weakness brought to the ER for respiratory distress with hypoxia, all sats in the 70s, sinus tachycardia. The patient was placed on CPAP and IV fluids, with improvement in the ER.  Received consult from RN for poor PO intake. Patient continues with poor appetite and poor intake of meals. Has not been receiving Magic Cup and [employer] with meals, RD to re-order. Patient refuses Ensure, Glucerna, and Boost supplements.  S/P swallow evaluation with SLP today, rec Regular consistency foods with nectar thick liquids.  Diet Order:  Diet Carb Modified Fluid consistency: Nectar Thick; Room service appropriate? Yes  Skin:  Wound (see comment) (DTI to sacrum; stage II to penis)  Last BM:  11/19  Height:   Ht Readings from Last 1 Encounters:  06/11/16 6\' 2"  (1.88 m)    Weight:   Wt Readings from Last 1 Encounters:  06/11/16 107 lb 5.8 oz (48.7 kg)    Ideal Body Weight:  79.5 kg  BMI:  Body mass index is 13.78 kg/m.  Estimated Nutritional Needs:   Kcal:  1600-1800  Protein:  75-85 gm  Fluid:  1.8 L  EDUCATION NEEDS:   No education needs identified at this time  Molli Barrows, [place], [place], [place] Pager [phone] After Hours Pager [phone]

## 2016-06-15 ENCOUNTER — Inpatient Hospital Stay (HOSPITAL_COMMUNITY): Payer: Medicare Other

## 2016-06-15 ENCOUNTER — Encounter (HOSPITAL_COMMUNITY): Payer: Self-pay | Admitting: Radiology

## 2016-06-15 DIAGNOSIS — R6520 Severe sepsis without septic shock: Secondary | ICD-10-CM

## 2016-06-15 DIAGNOSIS — B9689 Other specified bacterial agents as the cause of diseases classified elsewhere: Secondary | ICD-10-CM

## 2016-06-15 DIAGNOSIS — B957 Other staphylococcus as the cause of diseases classified elsewhere: Secondary | ICD-10-CM

## 2016-06-15 DIAGNOSIS — R7881 Bacteremia: Secondary | ICD-10-CM

## 2016-06-15 DIAGNOSIS — Z89612 Acquired absence of left leg above knee: Secondary | ICD-10-CM

## 2016-06-15 DIAGNOSIS — Z681 Body mass index (BMI) 19 or less, adult: Secondary | ICD-10-CM

## 2016-06-15 DIAGNOSIS — R64 Cachexia: Secondary | ICD-10-CM

## 2016-06-15 DIAGNOSIS — E43 Unspecified severe protein-calorie malnutrition: Secondary | ICD-10-CM

## 2016-06-15 DIAGNOSIS — A411 Sepsis due to other specified staphylococcus: Secondary | ICD-10-CM

## 2016-06-15 DIAGNOSIS — J189 Pneumonia, unspecified organism: Secondary | ICD-10-CM

## 2016-06-15 DIAGNOSIS — M868X7 Other osteomyelitis, ankle and foot: Secondary | ICD-10-CM

## 2016-06-15 DIAGNOSIS — Y95 Nosocomial condition: Secondary | ICD-10-CM

## 2016-06-15 DIAGNOSIS — I739 Peripheral vascular disease, unspecified: Secondary | ICD-10-CM

## 2016-06-15 DIAGNOSIS — E785 Hyperlipidemia, unspecified: Secondary | ICD-10-CM

## 2016-06-15 DIAGNOSIS — I69354 Hemiplegia and hemiparesis following cerebral infarction affecting left non-dominant side: Secondary | ICD-10-CM

## 2016-06-15 LAB — CULTURE, BLOOD (ROUTINE X 2)

## 2016-06-15 LAB — LEGIONELLA PNEUMOPHILA SEROGP 1 UR AG: L. pneumophila Serogp 1 Ur Ag: NEGATIVE

## 2016-06-15 LAB — GLUCOSE, CAPILLARY
GLUCOSE-CAPILLARY: 187 mg/dL — AB (ref 65–99)
Glucose-Capillary: 139 mg/dL — ABNORMAL HIGH (ref 65–99)
Glucose-Capillary: 157 mg/dL — ABNORMAL HIGH (ref 65–99)
Glucose-Capillary: 185 mg/dL — ABNORMAL HIGH (ref 65–99)
Glucose-Capillary: 189 mg/dL — ABNORMAL HIGH (ref 65–99)

## 2016-06-15 MED ORDER — IOPAMIDOL (ISOVUE-300) INJECTION 61%
INTRAVENOUS | Status: AC
  Administered 2016-06-15: 75 mL
  Filled 2016-06-15: qty 75

## 2016-06-15 MED ORDER — INSULIN ASPART 100 UNIT/ML ~~LOC~~ SOLN
0.0000 [IU] | Freq: Three times a day (TID) | SUBCUTANEOUS | Status: DC
  Administered 2016-06-15 – 2016-06-17 (×5): 2 [IU] via SUBCUTANEOUS

## 2016-06-15 NOTE — Progress Notes (Signed)
Subjective:  The patient is not communicating much at all   Antibiotics:  Anti-infectives    Start     Dose/Rate Route Frequency Ordered Stop   06/14/16 2200  ceFEPIme (MAXIPIME) 2 g in dextrose 5 % 50 mL IVPB     2 g 100 mL/hr over 30 Minutes Intravenous Every 12 hours 06/14/16 1318     06/12/16 1700  azithromycin (ZITHROMAX) 500 mg in dextrose 5 % 250 mL IVPB  Status:  Discontinued     500 mg 250 mL/hr over 60 Minutes Intravenous Every 24 hours 06/11/16 1709 06/12/16 1132   06/12/16 1230  azithromycin (ZITHROMAX) 500 mg in dextrose 5 % 250 mL IVPB     500 mg 250 mL/hr over 60 Minutes Intravenous Every 24 hours 06/12/16 1132     06/11/16 1800  vancomycin (VANCOCIN) 500 mg in sodium chloride 0.9 % 100 mL IVPB     500 mg 100 mL/hr over 60 Minutes Intravenous Every 12 hours 06/11/16 1708     06/11/16 1730  ceFEPIme (MAXIPIME) 2 g in dextrose 5 % 50 mL IVPB  Status:  Discontinued     2 g 100 mL/hr over 30 Minutes Intravenous Every 24 hours 06/11/16 1708 06/14/16 1319   06/11/16 1700  azithromycin (ZITHROMAX) 500 mg in dextrose 5 % 250 mL IVPB  Status:  Discontinued     500 mg 250 mL/hr over 60 Minutes Intravenous  Once 06/11/16 1656 06/12/16 1132      Medications: Scheduled Meds: . albuterol  5 mg Nebulization Once  . azithromycin  500 mg Intravenous Q24H  . buPROPion  150 mg Oral Daily  . ceFEPime (MAXIPIME) IV  2 g Intravenous Q12H  . Chlorhexidine Gluconate Cloth  6 each Topical Q0600  . clopidogrel  75 mg Oral Daily  . docusate sodium  200 mg Oral TID  . enalapril  5 mg Oral Daily  . enoxaparin (LOVENOX) injection  40 mg Subcutaneous Q24H  . feeding supplement (PRO-STAT SUGAR FREE 64)  30 mL Oral BID  . insulin aspart  0-9 Units Subcutaneous TID WC  . meclizine  25 mg Oral TID  . mouth rinse  15 mL Mouth Rinse BID  . mirtazapine  7.5 mg Oral QHS  . mupirocin ointment  1 application Nasal BID  . olopatadine  1 drop Both Eyes Daily  . pantoprazole  40 mg  Oral Q0600  . sertraline  100 mg Oral Daily  . tamsulosin  0.4 mg Oral Daily  . vancomycin  500 mg Intravenous Q12H   Continuous Infusions: PRN Meds:.acetaminophen, HYDROcodone-acetaminophen, ipratropium-albuterol, ondansetron, sodium chloride flush    Objective: Weight change:   Intake/Output Summary (Last 24 hours) at 06/15/16 1455 Last data filed at 06/15/16 0900  Gross per 24 hour  Intake              430 ml  Output              960 ml  Net             -530 ml   Blood pressure (!) 109/54, pulse 84, temperature 97.5 F (36.4 C), temperature source Oral, resp. rate 20, height 6\' 2"  (1.88 m), weight 107 lb 5.8 oz (48.7 kg), SpO2 100 %. Temp:  [97.3 F (36.3 C)-98.4 F (36.9 C)] 97.5 F (36.4 C) (11/23 1406) Pulse Rate:  [84-96] 84 (11/23 1406) Resp:  [17-27] 20 (11/23 1406) BP: (90-118)/(54-75) 109/54 (11/23 1406) SpO2:  [  93 %-100 %] 100 % (11/23 1406)  Physical Exam: General: Alert and awake Not communicative, is very cachectic HEENT: anicteric sclera, pupils reactive to light and accommodation, EOMI CVS regular rate, normal r,   Chest:  no wheezing,  Abdomen: soft nontender, nondistended,   Extremities:/skin:  06/15/16:      Neuro: hemiiplegic  CBC:  CBC Latest Ref Rng & Units 06/14/2016 06/13/2016 06/11/2016  WBC 4.0 - 10.5 K/uL 6.3 14.1(H) 19.7(H)  Hemoglobin 13.0 - 17.0 g/dL 8.6(L) 9.5(L) 11.4(L)  Hematocrit 39.0 - 52.0 % 26.8(L) 29.5(L) 35.2(L)  Platelets 150 - 400 K/uL 142(L) 189 241      BMET  Recent Labs  06/13/16 0023 06/14/16 1422  NA 133* 137  K 3.8 3.7  CL 101 103  CO2 23 26  GLUCOSE 205* 187*  BUN 28* 20  CREATININE 0.87 0.59*  CALCIUM 8.5* 8.3*     Liver Panel  No results for input(s): PROT, ALBUMIN, AST, ALT, ALKPHOS, BILITOT, BILIDIR, IBILI in the last 72 hours.     Sedimentation Rate No results for input(s): ESRSEDRATE in the last 72 hours. C-Reactive Protein No results for input(s): CRP in the last 72  hours.  Micro Results: Recent Results (from the past 720 hour(s))  Culture, blood (x 2)     Status: Abnormal   Collection Time: 06/11/16 12:15 PM  Result Value Ref Range Status   Specimen Description BLOOD RIGHT WRIST  Final   Special Requests BOTTLES DRAWN AEROBIC AND ANAEROBIC 5CC  Final   Culture  Setup Time   Final    GRAM POSITIVE COCCI IN CLUSTERS AEROBIC BOTTLE ONLY CRITICAL RESULT CALLED TO, READ BACK BY AND VERIFIED WITH: A. ANDERSON, PHARMD AT 1330 ON 06/12/16 BY C. JESSUP, MLT.    Culture (A)  Final    STAPHYLOCOCCUS SPECIES (COAGULASE NEGATIVE) SUSCEPTIBILITIES PERFORMED ON PREVIOUS CULTURE WITHIN THE LAST 5 DAYS.    Report Status 06/15/2016 FINAL  Final  Blood Culture ID Panel (Reflexed)     Status: Abnormal   Collection Time: 06/11/16 12:15 PM  Result Value Ref Range Status   Enterococcus species NOT DETECTED NOT DETECTED Final   Listeria monocytogenes NOT DETECTED NOT DETECTED Final   Staphylococcus species DETECTED (A) NOT DETECTED Final    Comment: CRITICAL RESULT CALLED TO, READ BACK BY AND VERIFIED WITH: A. ANDERSON, PHARMD AT 1330 ON 06/12/16 BY C. JESSUP, MLT.    Staphylococcus aureus NOT DETECTED NOT DETECTED Final   Methicillin resistance DETECTED (A) NOT DETECTED Final    Comment: CRITICAL RESULT CALLED TO, READ BACK BY AND VERIFIED WITH: A. ANDERSON, PHARMD AT 1330 ON 06/12/16 BY C. JESSUP, MLT.    Streptococcus species NOT DETECTED NOT DETECTED Final   Streptococcus agalactiae NOT DETECTED NOT DETECTED Final   Streptococcus pneumoniae NOT DETECTED NOT DETECTED Final   Streptococcus pyogenes NOT DETECTED NOT DETECTED Final   Acinetobacter baumannii NOT DETECTED NOT DETECTED Final   Enterobacteriaceae species NOT DETECTED NOT DETECTED Final   Enterobacter cloacae complex NOT DETECTED NOT DETECTED Final   Escherichia coli NOT DETECTED NOT DETECTED Final   Klebsiella oxytoca NOT DETECTED NOT DETECTED Final   Klebsiella pneumoniae NOT DETECTED NOT  DETECTED Final   Proteus species NOT DETECTED NOT DETECTED Final   Serratia marcescens NOT DETECTED NOT DETECTED Final   Haemophilus influenzae NOT DETECTED NOT DETECTED Final   Neisseria meningitidis NOT DETECTED NOT DETECTED Final   Pseudomonas aeruginosa NOT DETECTED NOT DETECTED Final   Candida albicans NOT DETECTED NOT DETECTED  Final   Candida glabrata NOT DETECTED NOT DETECTED Final   Candida krusei NOT DETECTED NOT DETECTED Final   Candida parapsilosis NOT DETECTED NOT DETECTED Final   Candida tropicalis NOT DETECTED NOT DETECTED Final  Urine culture     Status: Abnormal   Collection Time: 06/11/16  5:02 PM  Result Value Ref Range Status   Specimen Description URINE, CATHETERIZED  Final   Special Requests NONE  Final   Culture >=100,000 COLONIES/mL YEAST (A)  Final   Report Status 06/13/2016 FINAL  Final  MRSA PCR Screening     Status: Abnormal   Collection Time: 06/11/16  6:30 PM  Result Value Ref Range Status   MRSA by PCR POSITIVE (A) NEGATIVE Final    Comment:        The GeneXpert MRSA Assay (FDA approved for NASAL specimens only), is one component of a comprehensive MRSA colonization surveillance program. It is not intended to diagnose MRSA infection nor to guide or monitor treatment for MRSA infections. RESULT CALLED TO, READ BACK BY AND VERIFIED WITH: BALDWIN,O RN 06/11/16 AT 2030 SKEEN,P   Culture, blood (x 2)     Status: None (Preliminary result)   Collection Time: 06/11/16  6:35 PM  Result Value Ref Range Status   Specimen Description BLOOD RIGHT ARM  Final   Special Requests BOTTLES DRAWN AEROBIC AND ANAEROBIC 5ML EACH  Final   Culture NO GROWTH 4 DAYS  Final   Report Status PENDING  Incomplete  Culture, blood (routine x 2)     Status: None (Preliminary result)   Collection Time: 06/13/16 12:17 AM  Result Value Ref Range Status   Specimen Description BLOOD RIGHT ANTECUBITAL  Final   Special Requests IN PEDIATRIC BOTTLE 1CC  Final   Culture NO GROWTH 2  DAYS  Final   Report Status PENDING  Incomplete  Culture, blood (routine x 2)     Status: Abnormal   Collection Time: 06/13/16 12:22 AM  Result Value Ref Range Status   Specimen Description BLOOD LEFT HAND  Final   Special Requests IN PEDIATRIC BOTTLE 1CC  Final   Culture  Setup Time   Final    GRAM POSITIVE COCCI IN CLUSTERS AEROBIC BOTTLE ONLY CRITICAL VALUE NOTED.  VALUE IS CONSISTENT WITH PREVIOUSLY REPORTED AND CALLED VALUE.    Culture STAPHYLOCOCCUS SPECIES (COAGULASE NEGATIVE) (A)  Final   Report Status 06/15/2016 FINAL  Final   Organism ID, Bacteria STAPHYLOCOCCUS SPECIES (COAGULASE NEGATIVE)  Final      Susceptibility   Staphylococcus species (coagulase negative) - MIC*    CIPROFLOXACIN 4 RESISTANT Resistant     ERYTHROMYCIN >=8 RESISTANT Resistant     GENTAMICIN <=0.5 SENSITIVE Sensitive     OXACILLIN >=4 RESISTANT Resistant     TETRACYCLINE >=16 RESISTANT Resistant     VANCOMYCIN <=0.5 SENSITIVE Sensitive     TRIMETH/SULFA 40 SENSITIVE Sensitive     CLINDAMYCIN <=0.25 RESISTANT Resistant     RIFAMPIN <=0.5 SENSITIVE Sensitive     Inducible Clindamycin POSITIVE Resistant     * STAPHYLOCOCCUS SPECIES (COAGULASE NEGATIVE)    Studies/Results: Dg Chest Port 1 View  Result Date: 06/14/2016 CLINICAL DATA:  New cough, possible aspiration EXAM: PORTABLE CHEST 1 VIEW COMPARISON:  06/12/2016 FINDINGS: Cardiomediastinal silhouette is stable. There is streaky right basilar atelectasis or infiltrate. Aspiration pneumonia cannot be excluded. No pulmonary edema. Again noted mottled appearance with some cortical sclerosis left humeral shaft. Metastatic disease cannot be excluded. Clinical correlation is necessary there is probable old fracture  deformity left humeral neck. IMPRESSION: There is streaky right basilar atelectasis or infiltrate. Aspiration pneumonia cannot be excluded. No pulmonary edema. Again noted mottled appearance with some cortical sclerosis left humeral shaft.  Metastatic disease cannot be excluded. Clinical correlation is necessary there is probable old fracture deformity left humeral neck. Electronically Signed   By: Lahoma Crocker M.D.   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 male with   hx of CVA with left sided weakness, left aka, PVD, admitted for sepsis thought to be HCAP, initial cxr was clear though subsequent had infiltrate. He was found to have bacteremia in only 1 of 4 bottles, still potentially could be related. He has marked severe protein-caloric malnutrition with BMI 14 and ongoing dysphagia  #1 coagulase negative staph in one of 4 blood cultures: I had thought that he actually had 2 out of 2 cultures positive I'm highly skeptical of the significance of this organism.  #2 osteomyelitis will get CT with contrast of his right foot.  #3 pneumonia: He is covered with this with vancomycin cefepime and azithromycin I do not see a Legionella antigen back yet.  #4 goals of care would strongly encourage positive care consultation.    LOS: 4 days   Alcide Evener 06/15/2016, 2:55 PM

## 2016-06-15 NOTE — Progress Notes (Signed)
Pt transferred to 5w30 per MD order. All discharge instructions reviewed and all questions answered

## 2016-06-15 NOTE — Progress Notes (Addendum)
PROGRESS NOTE  Jeremiah Baldwin [id number] DOB: 11-24-1945 DOA: 06/11/2016 PCP: No primary care provider on file.   LOS: 4 days   Brief Narrative: 79 year oldHistory of CAD, left BKA, CVA with left-sided weakness, presented with respiratory distress and hypoxia. Patient being treated for sepsis secondary to pneumonia and bacteremia.  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 lactic acid was up to 3.4 - WBC improving, lactic acid improving - Left lower lobe atelectasis or pneumonia on CXR - Continue vancomycin, cefepime, azithromycin - Influenza PCR negative - Blood cultures 06/11/2016: 1/2 Coag negative staph. Staph aureus negative. Repeat cultures on 11/21 with coag negative staph so concern that this may be real. Discussed with ID, appreciate consult. Plan for TEE, messaged cardiology to have this done as soon as feasible  Acute hypoxic respiratory failure - Upon admission, oxygen saturations are to be in the 70s - Possibly secondary to pneumonia - Continue supplemental oxygen to maintain saturations above 92% - Continue neb treatments, antibiotics - improving, stable this morning, transfer to telemetry floor  Diabetes mellitus, type II - Metformin held, continue insulin sliding scale  History of of CVA with left-sided residual weakness - Continue Plavix  Depression - Continue Zoloft, Wellbutrin  GERD  - Continue PPI  ?dysphagia vs aspiration - Speech consulted and appreciated - had modified barium swallow: dysphagia 3, thin liquid  Normocytic  anemia - baseline hemoglobin 11, currently 9.5 >> 8.6 (drop likely dilutional) - Continue to monitor CBC   DVT prophylaxis: lovenox Code Status: DNR Family Communication: d/w wife bedside 11/22 pm Disposition Plan: TBD  Consultants:   ID  Procedures:   None   Antimicrobials:  vanc / Cefepime / zithromax 11/21 >>   Subjective: - non verbal, alert and following commands. No complaints and shakes head no to ROS  Objective: Vitals:   06/14/16 2045 06/14/16 2335 06/15/16 0526 06/15/16 0700  BP: 95/60 90/60 115/70 118/75  Pulse: 84 96 90 88  Resp: (!) 23 (!) 23 (!) 24 (!) 27  Temp: 97.6 F (36.4 C) 98.4 F (36.9 C) 98 F (36.7 C) 98.1 F (36.7 C)  TempSrc: Oral Oral Oral Oral  SpO2: 96% 93% 95% 96%  Weight:      Height:        Intake/Output Summary (Last 24 hours) at 06/15/16 1014 Last data filed at 06/15/16 0900  Gross per 24 hour  Intake              680 ml  Output             1010 ml  Net             -330 ml   Filed Weights   06/11/16 1831  Weight: 48.7 kg (107 lb 5.8 oz)    Examination: Constitutional: cachectic  Vitals:   06/14/16 2045 06/14/16 2335 06/15/16 0526 06/15/16 0700  BP: 95/60 90/60 115/70 118/75  Pulse: 84 96 90 88  Resp: (!) 23 (!) 23 (!) 24 (!) 27  Temp: 97.6 F (36.4 C) 98.4 F (36.9 C) 98 F (36.7 C) 98.1 F (36.7 C)  TempSrc: Oral Oral Oral Oral  SpO2: 96% 93% 95% 96%  Weight:      Height:       ENMT: Mucous membranes are moist.  Respiratory: clear to auscultation bilaterally, no wheezing, no crackles. Normal respiratory effort.  Cardiovascular: Regular rate and rhythm, no murmurs / rubs / gallops. No LE edema.   Abdomen: no tenderness. Bowel sounds positive.  Musculoskeletal: no clubbing / cyanosis. Left AKA Skin: sacral pressure injury without skin breakdown Neurologic: left sided weakness / hand contracture, chronic. Psychiatric: flat   Data Reviewed: I have personally reviewed following labs and imaging  studies  CBC:  Recent Labs Lab 06/11/16 1243 06/11/16 1835 06/13/16 0023 06/14/16 1422  WBC 22.9* 19.7* 14.1* 6.3  NEUTROABS 19.3* 17.9*  --   --   HGB 11.3* 11.4* 9.5* 8.6*  HCT 35.6* 35.2* 29.5* 26.8*  MCV 84.2 82.4 81.7 83.2  PLT 292 241 189 [id number]*   Basic Metabolic Panel:  Recent Labs Lab 06/11/16 1243 06/11/16 1835 06/13/16 0023 06/14/16 1422  NA 140 137 133* 137  K 4.2 4.3 3.8 3.7  CL 104 101 101 103  CO2 23 23 23 26   GLUCOSE 290* 279* 205* 187*  BUN 29* 32* 28* 20  CREATININE 1.03 0.99 0.87 0.59*  CALCIUM 9.2 9.2 8.5* 8.3*   GFR: Estimated Creatinine Clearance: 59.2 mL/min (by C-G formula based on SCr of 0.59 mg/dL (L)). Liver Function Tests:  Recent Labs Lab 06/11/16 1243 06/11/16 1835  AST 18 18  ALT 9* 10*  ALKPHOS 97 97  BILITOT 0.8 0.7  PROT 6.7 7.0  ALBUMIN 3.1* 3.1*   No results for input(s): LIPASE, AMYLASE in the last 168 hours. No results for input(s): AMMONIA in the last 168 hours. Coagulation Profile:  Recent Labs Lab 06/11/16 1835  INR 1.14   Cardiac Enzymes:  Recent Labs Lab 06/11/16 1243 06/11/16 1835  TROPONINI 0.10* <0.03   BNP (last 3 results) No results for input(s): PROBNP in the last 8760 hours. HbA1C: No results for input(s): HGBA1C in the last 72 hours. CBG:  Recent Labs Lab 06/14/16 0724 06/14/16 1308 06/14/16 1630 06/14/16 2045 06/15/16 0840  GLUCAP 136* 165* 120* 183* 187*   Lipid Profile: No results for input(s): CHOL, HDL, LDLCALC, TRIG, CHOLHDL, LDLDIRECT in the last 72 hours. Thyroid Function Tests: No results for input(s): TSH, T4TOTAL, FREET4, T3FREE, THYROIDAB in the last 72 hours. Anemia Panel: No results for input(s): VITAMINB12, FOLATE, FERRITIN, TIBC, IRON, RETICCTPCT in the last 72 hours. Urine analysis:    Component Value Date/Time   COLORURINE YELLOW 06/11/2016 1701   APPEARANCEUR CLOUDY (A) 06/11/2016 1701   LABSPEC 1.018 06/11/2016 1701   PHURINE 5.5 06/11/2016 1701   GLUCOSEU  250 (A) 06/11/2016 1701   HGBUR SMALL (A) 06/11/2016 1701   BILIRUBINUR NEGATIVE 06/11/2016 1701   KETONESUR NEGATIVE 06/11/2016 1701   PROTEINUR NEGATIVE 06/11/2016 1701   NITRITE NEGATIVE 06/11/2016 1701   LEUKOCYTESUR LARGE (A) 06/11/2016 1701   Sepsis Labs: Invalid input(s): PROCALCITONIN, LACTICIDVEN  Recent Results (from the past 240 hour(s))  Culture, blood (x 2)     Status: Abnormal   Collection Time: 06/11/16 12:15 PM  Result Value Ref Range Status   Specimen Description BLOOD RIGHT WRIST  Final   Special Requests BOTTLES DRAWN AEROBIC AND ANAEROBIC 5CC  Final   Culture  Setup Time   Final    GRAM POSITIVE COCCI IN CLUSTERS AEROBIC BOTTLE ONLY CRITICAL RESULT CALLED TO, READ BACK BY AND VERIFIED WITH: A.  ANDERSON, PHARMD AT 1330 ON 06/12/16 BY C. JESSUP, MLT.    Culture (A)  Final    STAPHYLOCOCCUS SPECIES (COAGULASE NEGATIVE) SUSCEPTIBILITIES PERFORMED ON PREVIOUS CULTURE WITHIN THE LAST 5 DAYS.    Report Status 06/15/2016 FINAL  Final  Blood Culture ID Panel (Reflexed)     Status: Abnormal   Collection Time: 06/11/16 12:15 PM  Result Value Ref Range Status   Enterococcus species NOT DETECTED NOT DETECTED Final   Listeria monocytogenes NOT DETECTED NOT DETECTED Final   Staphylococcus species DETECTED (A) NOT DETECTED Final    Comment: CRITICAL RESULT CALLED TO, READ BACK BY AND VERIFIED WITH: A. ANDERSON, PHARMD AT 1330 ON 06/12/16 BY C. JESSUP, MLT.    Staphylococcus aureus NOT DETECTED NOT DETECTED Final   Methicillin resistance DETECTED (A) NOT DETECTED Final    Comment: CRITICAL RESULT CALLED TO, READ BACK BY AND VERIFIED WITH: A. ANDERSON, PHARMD AT 1330 ON 06/12/16 BY C. JESSUP, MLT.    Streptococcus species NOT DETECTED NOT DETECTED Final   Streptococcus agalactiae NOT DETECTED NOT DETECTED Final   Streptococcus pneumoniae NOT DETECTED NOT DETECTED Final   Streptococcus pyogenes NOT DETECTED NOT DETECTED Final   Acinetobacter baumannii NOT DETECTED NOT  DETECTED Final   Enterobacteriaceae species NOT DETECTED NOT DETECTED Final   Enterobacter cloacae complex NOT DETECTED NOT DETECTED Final   Escherichia coli NOT DETECTED NOT DETECTED Final   Klebsiella oxytoca NOT DETECTED NOT DETECTED Final   Klebsiella pneumoniae NOT DETECTED NOT DETECTED Final   Proteus species NOT DETECTED NOT DETECTED Final   Serratia marcescens NOT DETECTED NOT DETECTED Final   Haemophilus influenzae NOT DETECTED NOT DETECTED Final   Neisseria meningitidis NOT DETECTED NOT DETECTED Final   Pseudomonas aeruginosa NOT DETECTED NOT DETECTED Final   Candida albicans NOT DETECTED NOT DETECTED Final   Candida glabrata NOT DETECTED NOT DETECTED Final   Candida krusei NOT DETECTED NOT DETECTED Final   Candida parapsilosis NOT DETECTED NOT DETECTED Final   Candida tropicalis NOT DETECTED NOT DETECTED Final  Urine culture     Status: Abnormal   Collection Time: 06/11/16  5:02 PM  Result Value Ref Range Status   Specimen Description URINE, CATHETERIZED  Final   Special Requests NONE  Final   Culture >=100,000 COLONIES/mL YEAST (A)  Final   Report Status 06/13/2016 FINAL  Final  MRSA PCR Screening     Status: Abnormal   Collection Time: 06/11/16  6:30 PM  Result Value Ref Range Status   MRSA by PCR POSITIVE (A) NEGATIVE Final    Comment:        The GeneXpert MRSA Assay (FDA approved for NASAL specimens only), is one component of a comprehensive MRSA colonization surveillance program. It is not intended to diagnose MRSA infection nor to guide or monitor treatment for MRSA infections. RESULT CALLED TO, READ BACK BY AND VERIFIED WITH: BALDWIN,O RN 06/11/16 AT 2030 SKEEN,P   Culture, blood (x 2)     Status: None (Preliminary result)   Collection Time: 06/11/16  6:35 PM  Result Value Ref Range Status   Specimen Description BLOOD RIGHT ARM  Final   Special Requests BOTTLES DRAWN AEROBIC AND ANAEROBIC 5ML EACH  Final   Culture NO GROWTH 3 DAYS  Final   Report Status  PENDING  Incomplete  Culture, blood (routine x 2)     Status: None (Preliminary result)   Collection Time: 06/13/16 12:17 AM  Result Value Ref Range Status   Specimen Description BLOOD RIGHT ANTECUBITAL  Final   Special Requests IN PEDIATRIC BOTTLE 1CC  Final   Culture NO GROWTH 1 DAY  Final   Report Status PENDING  Incomplete  Culture, blood (routine x 2)     Status: Abnormal   Collection Time: 06/13/16 12:22 AM  Result Value Ref Range Status   Specimen Description BLOOD LEFT HAND  Final   Special Requests IN PEDIATRIC BOTTLE 1CC  Final   Culture  Setup Time   Final    GRAM POSITIVE COCCI IN CLUSTERS AEROBIC BOTTLE ONLY CRITICAL VALUE NOTED.  VALUE IS CONSISTENT WITH PREVIOUSLY REPORTED AND CALLED VALUE.    Culture STAPHYLOCOCCUS SPECIES (COAGULASE NEGATIVE) (A)  Final   Report Status 06/15/2016 FINAL  Final   Organism ID, Bacteria STAPHYLOCOCCUS SPECIES (COAGULASE NEGATIVE)  Final      Susceptibility   Staphylococcus species (coagulase negative) - MIC*    CIPROFLOXACIN 4 RESISTANT Resistant     ERYTHROMYCIN >=8 RESISTANT Resistant     GENTAMICIN <=0.5 SENSITIVE Sensitive     OXACILLIN >=4 RESISTANT Resistant     TETRACYCLINE >=16 RESISTANT Resistant     VANCOMYCIN <=0.5 SENSITIVE Sensitive     TRIMETH/SULFA 40 SENSITIVE Sensitive     CLINDAMYCIN <=0.25 RESISTANT Resistant     RIFAMPIN <=0.5 SENSITIVE Sensitive     Inducible Clindamycin POSITIVE Resistant     * STAPHYLOCOCCUS SPECIES (COAGULASE NEGATIVE)      Radiology Studies: Dg Chest Port 1 View  Result Date: 06/14/2016 CLINICAL DATA:  New cough, possible aspiration EXAM: PORTABLE CHEST 1 VIEW COMPARISON:  06/12/2016 FINDINGS: Cardiomediastinal silhouette is stable. There is streaky right basilar atelectasis or infiltrate. Aspiration pneumonia cannot be excluded. No pulmonary edema. Again noted mottled appearance with some cortical sclerosis left humeral shaft. Metastatic disease cannot be excluded. Clinical correlation  is necessary there is probable old fracture deformity left humeral neck. IMPRESSION: There is streaky right basilar atelectasis or infiltrate. Aspiration pneumonia cannot be excluded. No pulmonary edema. Again noted mottled appearance with some cortical sclerosis left humeral shaft. Metastatic disease cannot be excluded. Clinical correlation is necessary there is probable old fracture deformity left humeral neck. Electronically Signed   By: Lahoma Crocker M.D.   On: 06/14/2016 12:33     Scheduled Meds: . albuterol  5 mg Nebulization Once  . azithromycin  500 mg Intravenous Q24H  . buPROPion  150 mg Oral Daily  . ceFEPime (MAXIPIME) IV  2 g Intravenous Q12H  . Chlorhexidine Gluconate Cloth  6 each Topical Q0600  . clopidogrel  75 mg Oral Daily  . docusate sodium  200 mg Oral TID  . enalapril  5 mg Oral Daily  . enoxaparin (LOVENOX) injection  40 mg Subcutaneous Q24H  . feeding supplement (PRO-STAT SUGAR FREE 64)  30 mL Oral BID  . insulin aspart  0-9 Units Subcutaneous TID WC  . meclizine  25 mg Oral TID  . mouth rinse  15 mL Mouth Rinse BID  . mirtazapine  7.5 mg Oral QHS  . mupirocin ointment  1 application Nasal BID  . olopatadine  1 drop Both Eyes Daily  . pantoprazole  40 mg Oral Q0600  . sertraline  100 mg Oral Daily  . tamsulosin  0.4 mg Oral Daily  . vancomycin  500 mg Intravenous Q12H   Continuous Infusions:   Marzetta Board, MD, PhD [facility] Pager [phone]  If 7PM-7AM, please contact night-coverage [email or web address] Password [email or web address] 06/15/2016, 10:14 AM

## 2016-06-16 ENCOUNTER — Encounter (HOSPITAL_COMMUNITY): Payer: Self-pay | Admitting: *Deleted

## 2016-06-16 ENCOUNTER — Inpatient Hospital Stay (HOSPITAL_COMMUNITY): Payer: Medicare Other

## 2016-06-16 ENCOUNTER — Encounter (HOSPITAL_COMMUNITY)
Admission: EM | Disposition: A | Discharge status: Skilled Nursing Facility | Payer: Self-pay | Source: Home / Self Care | Attending: Internal Medicine

## 2016-06-16 DIAGNOSIS — R7881 Bacteremia: Secondary | ICD-10-CM

## 2016-06-16 DIAGNOSIS — R64 Cachexia: Secondary | ICD-10-CM

## 2016-06-16 DIAGNOSIS — I348 Other nonrheumatic mitral valve disorders: Secondary | ICD-10-CM

## 2016-06-16 HISTORY — PX: TEE WITHOUT CARDIOVERSION: SHX5443

## 2016-06-16 LAB — CBC
HCT: 26.9 % — ABNORMAL LOW (ref 39.0–52.0)
Hemoglobin: 8.5 g/dL — ABNORMAL LOW (ref 13.0–17.0)
MCH: 26.3 pg (ref 26.0–34.0)
MCHC: 31.6 g/dL (ref 30.0–36.0)
MCV: 83.3 fL (ref 78.0–100.0)
PLATELETS: 182 10*3/uL (ref 150–400)
RBC: 3.23 MIL/uL — ABNORMAL LOW (ref 4.22–5.81)
RDW: 16.2 % — AB (ref 11.5–15.5)
WBC: 7.3 10*3/uL (ref 4.0–10.5)

## 2016-06-16 LAB — GLUCOSE, CAPILLARY
Glucose-Capillary: 157 mg/dL — ABNORMAL HIGH (ref 65–99)
Glucose-Capillary: 111 mg/dL — ABNORMAL HIGH (ref 65–99)
Glucose-Capillary: 166 mg/dL — ABNORMAL HIGH (ref 65–99)

## 2016-06-16 LAB — BASIC METABOLIC PANEL
ANION GAP: 8 (ref 5–15)
BUN: 18 mg/dL (ref 4–21)
BUN: 18 mg/dL (ref 6–20)
CALCIUM: 8.7 mg/dL — AB (ref 8.9–10.3)
CO2: 27 mmol/L (ref 22–32)
CREATININE: 0.7 mg/dL (ref 0.6–1.3)
CREATININE: 0.68 mg/dL (ref 0.61–1.24)
Chloride: 105 mmol/L (ref 101–111)
GLUCOSE: 141 mg/dL — AB (ref 65–99)
Glucose: 141 mg/dL
Potassium: 3.6 mmol/L (ref 3.5–5.1)
SODIUM: 140 mmol/L (ref 137–147)
Sodium: 140 mmol/L (ref 135–145)

## 2016-06-16 LAB — CBC AND DIFFERENTIAL: WBC: 7.3 10*3/mL

## 2016-06-16 LAB — CULTURE, BLOOD (ROUTINE X 2): Culture: NO GROWTH

## 2016-06-16 SURGERY — ECHOCARDIOGRAM, TRANSESOPHAGEAL
Anesthesia: Moderate Sedation

## 2016-06-16 MED ORDER — BUTAMBEN-TETRACAINE-BENZOCAINE 2-2-14 % EX AERO
INHALATION_SPRAY | CUTANEOUS | Status: DC | PRN
  Administered 2016-06-16: 2 via TOPICAL

## 2016-06-16 MED ORDER — FENTANYL CITRATE (PF) 100 MCG/2ML IJ SOLN
INTRAMUSCULAR | Status: AC
  Filled 2016-06-16: qty 2

## 2016-06-16 MED ORDER — SODIUM CHLORIDE 0.9 % IV SOLN: INTRAVENOUS | Status: DC

## 2016-06-16 MED ORDER — LIDOCAINE VISCOUS 2 % MT SOLN
OROMUCOSAL | Status: AC
  Filled 2016-06-16: qty 15

## 2016-06-16 MED ORDER — FENTANYL CITRATE (PF) 100 MCG/2ML IJ SOLN
INTRAMUSCULAR | Status: DC | PRN
  Administered 2016-06-16: 12.5 ug via INTRAVENOUS

## 2016-06-16 MED ORDER — MIDAZOLAM HCL 10 MG/2ML IJ SOLN
INTRAMUSCULAR | Status: DC | PRN
  Administered 2016-06-16 (×2): 1 mg via INTRAVENOUS

## 2016-06-16 MED ORDER — MIDAZOLAM HCL 5 MG/ML IJ SOLN
INTRAMUSCULAR | Status: AC
  Filled 2016-06-16: qty 2

## 2016-06-16 NOTE — Progress Notes (Signed)
Speech Language Pathology Treatment: Dysphagia  Patient Details Name: Jeremiah Baldwin MRN: [id number] DOB: 1945-12-23 Today's Date: 06/16/2016 Time: 0811-0821 SLP Time Calculation (min) (ACUTE ONLY): 10 min  Assessment / Plan / Recommendation Clinical Impression  Brief session with pt, readied pt for breakfast, repositioned, gave three sips of nectar thick juice when RN came in to say pt to initiate NPO for TEE today. No solids given.  Despite upright posture, pt struggled with trunk and head support, verbal tactile cues given for head upright. Delayed swallow observed with each sip of nectar, followed by coughing. Could not attempt further modifications or strategies due to plan for procedure. Recommend pt continue current diet today, will f/u again for tolerance given findings.    HPI HPI: 70 y.o. male from SNF admitted with PNA and Sepsis. Pt with hx of CVA with left hemiparesis, L BKA, CAD, Anorexia, DM, Retinopathy, HTN, and PVD. Pt had MBS 11/21 wtih recomendations for regular diet and thin liquids; however SLP was re-ordered the next day due to frequent coughing with thin liquids observed by RN.      SLP Plan  Continue with current plan of care     Recommendations  Diet recommendations: Regular;Nectar-thick liquid Liquids provided via: Cup;Straw Medication Administration: Whole meds with puree Supervision: Staff to assist with self feeding Compensations: Minimize environmental distractions;Slow rate;Small sips/bites Postural Changes and/or Swallow Maneuvers: Seated upright 90 degrees                Oral Care Recommendations: Oral care BID Follow up Recommendations: Skilled Nursing facility Plan: Continue with current plan of care       [place] Kordel Leavy, MA CCC-SLP [id number]  Jeremiah Baldwin 06/16/2016, 9:05 AM

## 2016-06-16 NOTE — Progress Notes (Signed)
  Echocardiogram Echocardiogram Transesophageal has been performed.  Jeremiah Baldwin 06/16/2016, 1:08 PM

## 2016-06-16 NOTE — Progress Notes (Signed)
PROGRESS NOTE  Jeremiah Baldwin [id number] DOB: Jul 18, 1946 DOA: 06/11/2016 PCP: No primary care provider on file.   LOS: 5 days   Brief Narrative: 45 year oldHistory of CAD, left BKA, CVA with left-sided weakness, presented with respiratory distress and hypoxia. Patient being treated for sepsis secondary to pneumonia and bacteremia.  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 lactic acid was up to 3.4 - WBC improving, lactic acid improving - Left lower lobe atelectasis or pneumonia on CXR - Continue vancomycin, cefepime, azithromycin - Influenza PCR negative - Blood cultures 06/11/2016: 1/2 Coag negative staph. Staph aureus negative. Repeat cultures on 11/21 with coag negative staph so concern that this may be real. Discussed with ID, appreciate consult.  - TEE done today negative for endocarditis. Appreciate cardiology. CT scan of the right foot without any evidence of progressive osteomyelitis. Defer to infectious disease narrowing antibiotics and total length of IV duration  Acute hypoxic respiratory failure - Upon admission, oxygen saturations are to be in the 70s - Possibly secondary to pneumonia - Continue supplemental oxygen to maintain saturations above 92% - Continue neb treatments, antibiotics - improving, stable this morning  Diabetes mellitus, type II - Metformin held, continue insulin sliding scale  History of of CVA with left-sided residual weakness - Continue Plavix  Depression -  Continue Zoloft, Wellbutrin  GERD  - Continue PPI  ?dysphagia vs aspiration - Speech consulted and appreciated - had modified barium swallow: dysphagia 3, thin liquid  Normocytic anemia - baseline hemoglobin 11, currently 9.5 >> 8.6, now stable to 8.5 - Continue to monitor CBC   DVT prophylaxis: lovenox Code Status: DNR Family Communication: d/w wife bedside 11/24 Disposition Plan: TBD  Consultants:   ID  Procedures:   None   Antimicrobials:  vanc / Cefepime / zithromax 11/21 >>   Subjective: - non verbal, alert and following commands. No complaints and shakes head no to ROS  Objective: Vitals:   06/16/16 1225 06/16/16 1230 06/16/16 1240 06/16/16 1250  BP: (!) 87/60 (!) 94/54 (!) 90/52 (!) 90/46  Pulse: 86 82 84 81  Resp: 13 20 17 20   Temp:      TempSrc:      SpO2: 98% 100% 97% 98%  Weight:      Height:        Intake/Output Summary (Last 24 hours) at 06/16/16 1448 Last data filed at 06/16/16 1032  Gross per 24 hour  Intake              100 ml  Output              900 ml  Net             -800 ml   Filed Weights   06/11/16 1831  Weight: 48.7 kg (107 lb 5.8 oz)    Examination: Constitutional: cachectic  Vitals:   06/16/16 1225 06/16/16 1230 06/16/16 1240 06/16/16 1250  BP: (!) 87/60 (!) 94/54 (!) 90/52 (!) 90/46  Pulse: 86 82 84 81  Resp: 13 20 17 20   Temp:  TempSrc:      SpO2: 98% 100% 97% 98%  Weight:      Height:       ENMT: Mucous membranes are moist.  Respiratory: clear to auscultation bilaterally, no wheezing, no crackles. Normal respiratory effort.  Cardiovascular: Regular rate and rhythm, no murmurs / rubs / gallops. No LE edema.   Abdomen: no tenderness. Bowel sounds positive.  Musculoskeletal: no clubbing / cyanosis. Left AKA Skin: sacral pressure injury without skin breakdown Neurologic: left sided weakness / hand contracture, chronic. Psychiatric: flat   Data Reviewed: I have personally reviewed following labs and imaging  studies  CBC:  Recent Labs Lab 06/11/16 1243 06/11/16 1835 06/13/16 0023 06/14/16 1422 06/16/16 0609  WBC 22.9* 19.7* 14.1* 6.3 7.3  NEUTROABS 19.3* 17.9*  --   --   --   HGB 11.3* 11.4* 9.5* 8.6* 8.5*  HCT 35.6* 35.2* 29.5* 26.8* 26.9*  MCV 84.2 82.4 81.7 83.2 83.3  PLT 292 241 189 142* [id number]   Basic Metabolic Panel:  Recent Labs Lab 06/11/16 1243 06/11/16 1835 06/13/16 0023 06/14/16 1422 06/16/16 0609  NA 140 137 133* 137 140  K 4.2 4.3 3.8 3.7 3.6  CL 104 101 101 103 105  CO2 23 23 23 26 27   GLUCOSE 290* 279* 205* 187* 141*  BUN 29* 32* 28* 20 18  CREATININE 1.03 0.99 0.87 0.59* 0.68  CALCIUM 9.2 9.2 8.5* 8.3* 8.7*   GFR: Estimated Creatinine Clearance: 59.2 mL/min (by C-G formula based on SCr of 0.68 mg/dL). Liver Function Tests:  Recent Labs Lab 06/11/16 1243 06/11/16 1835  AST 18 18  ALT 9* 10*  ALKPHOS 97 97  BILITOT 0.8 0.7  PROT 6.7 7.0  ALBUMIN 3.1* 3.1*   No results for input(s): LIPASE, AMYLASE in the last 168 hours. No results for input(s): AMMONIA in the last 168 hours. Coagulation Profile:  Recent Labs Lab 06/11/16 1835  INR 1.14   Cardiac Enzymes:  Recent Labs Lab 06/11/16 1243 06/11/16 1835  TROPONINI 0.10* <0.03   BNP (last 3 results) No results for input(s): PROBNP in the last 8760 hours. HbA1C: No results for input(s): HGBA1C in the last 72 hours. CBG:  Recent Labs Lab 06/15/16 1713 06/15/16 2122 06/15/16 2346 06/16/16 0833 06/16/16 1347  GLUCAP 189* 185* 139* 157* 111*   Lipid Profile: No results for input(s): CHOL, HDL, LDLCALC, TRIG, CHOLHDL, LDLDIRECT in the last 72 hours. Thyroid Function Tests: No results for input(s): TSH, T4TOTAL, FREET4, T3FREE, THYROIDAB in the last 72 hours. Anemia Panel: No results for input(s): VITAMINB12, FOLATE, FERRITIN, TIBC, IRON, RETICCTPCT in the last 72 hours. Urine analysis:    Component Value Date/Time   COLORURINE YELLOW 06/11/2016 1701   APPEARANCEUR CLOUDY (A)  06/11/2016 1701   LABSPEC 1.018 06/11/2016 1701   PHURINE 5.5 06/11/2016 1701   GLUCOSEU 250 (A) 06/11/2016 1701   HGBUR SMALL (A) 06/11/2016 1701   BILIRUBINUR NEGATIVE 06/11/2016 1701   KETONESUR NEGATIVE 06/11/2016 1701   PROTEINUR NEGATIVE 06/11/2016 1701   NITRITE NEGATIVE 06/11/2016 1701   LEUKOCYTESUR LARGE (A) 06/11/2016 1701   Sepsis Labs: Invalid input(s): PROCALCITONIN, LACTICIDVEN  Recent Results (from the past 240 hour(s))  Culture, blood (x 2)     Status: Abnormal   Collection Time: 06/11/16 12:15 PM  Result Value Ref Range Status   Specimen Description BLOOD RIGHT WRIST  Final   Special Requests BOTTLES DRAWN AEROBIC AND ANAEROBIC 5CC  Final   Culture  Setup Time   Final  GRAM POSITIVE COCCI IN CLUSTERS AEROBIC BOTTLE ONLY CRITICAL RESULT CALLED TO, READ BACK BY AND VERIFIED WITH: A. ANDERSON, PHARMD AT 1330 ON 06/12/16 BY C. JESSUP, MLT.    Culture (A)  Final    STAPHYLOCOCCUS SPECIES (COAGULASE NEGATIVE) SUSCEPTIBILITIES PERFORMED ON PREVIOUS CULTURE WITHIN THE LAST 5 DAYS.    Report Status 06/15/2016 FINAL  Final  Blood Culture ID Panel (Reflexed)     Status: Abnormal   Collection Time: 06/11/16 12:15 PM  Result Value Ref Range Status   Enterococcus species NOT DETECTED NOT DETECTED Final   Listeria monocytogenes NOT DETECTED NOT DETECTED Final   Staphylococcus species DETECTED (A) NOT DETECTED Final    Comment: CRITICAL RESULT CALLED TO, READ BACK BY AND VERIFIED WITH: A. ANDERSON, PHARMD AT 1330 ON 06/12/16 BY C. JESSUP, MLT.    Staphylococcus aureus NOT DETECTED NOT DETECTED Final   Methicillin resistance DETECTED (A) NOT DETECTED Final    Comment: CRITICAL RESULT CALLED TO, READ BACK BY AND VERIFIED WITH: A. ANDERSON, PHARMD AT 1330 ON 06/12/16 BY C. JESSUP, MLT.    Streptococcus species NOT DETECTED NOT DETECTED Final   Streptococcus agalactiae NOT DETECTED NOT DETECTED Final   Streptococcus pneumoniae NOT DETECTED NOT DETECTED Final    Streptococcus pyogenes NOT DETECTED NOT DETECTED Final   Acinetobacter baumannii NOT DETECTED NOT DETECTED Final   Enterobacteriaceae species NOT DETECTED NOT DETECTED Final   Enterobacter cloacae complex NOT DETECTED NOT DETECTED Final   Escherichia coli NOT DETECTED NOT DETECTED Final   Klebsiella oxytoca NOT DETECTED NOT DETECTED Final   Klebsiella pneumoniae NOT DETECTED NOT DETECTED Final   Proteus species NOT DETECTED NOT DETECTED Final   Serratia marcescens NOT DETECTED NOT DETECTED Final   Haemophilus influenzae NOT DETECTED NOT DETECTED Final   Neisseria meningitidis NOT DETECTED NOT DETECTED Final   Pseudomonas aeruginosa NOT DETECTED NOT DETECTED Final   Candida albicans NOT DETECTED NOT DETECTED Final   Candida glabrata NOT DETECTED NOT DETECTED Final   Candida krusei NOT DETECTED NOT DETECTED Final   Candida parapsilosis NOT DETECTED NOT DETECTED Final   Candida tropicalis NOT DETECTED NOT DETECTED Final  Urine culture     Status: Abnormal   Collection Time: 06/11/16  5:02 PM  Result Value Ref Range Status   Specimen Description URINE, CATHETERIZED  Final   Special Requests NONE  Final   Culture >=100,000 COLONIES/mL YEAST (A)  Final   Report Status 06/13/2016 FINAL  Final  MRSA PCR Screening     Status: Abnormal   Collection Time: 06/11/16  6:30 PM  Result Value Ref Range Status   MRSA by PCR POSITIVE (A) NEGATIVE Final    Comment:        The GeneXpert MRSA Assay (FDA approved for NASAL specimens only), is one component of a comprehensive MRSA colonization surveillance program. It is not intended to diagnose MRSA infection nor to guide or monitor treatment for MRSA infections. RESULT CALLED TO, READ BACK BY AND VERIFIED WITH: BALDWIN,O RN 06/11/16 AT 2030 SKEEN,P   Culture, blood (x 2)     Status: None (Preliminary result)   Collection Time: 06/11/16  6:35 PM  Result Value Ref Range Status   Specimen Description BLOOD RIGHT ARM  Final   Special Requests  BOTTLES DRAWN AEROBIC AND ANAEROBIC 5ML EACH  Final   Culture NO GROWTH 4 DAYS  Final   Report Status PENDING  Incomplete  Culture, blood (routine x 2)     Status: None (Preliminary result)  Collection Time: 06/13/16 12:17 AM  Result Value Ref Range Status   Specimen Description BLOOD RIGHT ANTECUBITAL  Final   Special Requests IN PEDIATRIC BOTTLE 1CC  Final   Culture NO GROWTH 2 DAYS  Final   Report Status PENDING  Incomplete  Culture, blood (routine x 2)     Status: Abnormal   Collection Time: 06/13/16 12:22 AM  Result Value Ref Range Status   Specimen Description BLOOD LEFT HAND  Final   Special Requests IN PEDIATRIC BOTTLE 1CC  Final   Culture  Setup Time   Final    GRAM POSITIVE COCCI IN CLUSTERS AEROBIC BOTTLE ONLY CRITICAL VALUE NOTED.  VALUE IS CONSISTENT WITH PREVIOUSLY REPORTED AND CALLED VALUE.    Culture STAPHYLOCOCCUS SPECIES (COAGULASE NEGATIVE) (A)  Final   Report Status 06/15/2016 FINAL  Final   Organism ID, Bacteria STAPHYLOCOCCUS SPECIES (COAGULASE NEGATIVE)  Final      Susceptibility   Staphylococcus species (coagulase negative) - MIC*    CIPROFLOXACIN 4 RESISTANT Resistant     ERYTHROMYCIN >=8 RESISTANT Resistant     GENTAMICIN <=0.5 SENSITIVE Sensitive     OXACILLIN >=4 RESISTANT Resistant     TETRACYCLINE >=16 RESISTANT Resistant     VANCOMYCIN <=0.5 SENSITIVE Sensitive     TRIMETH/SULFA 40 SENSITIVE Sensitive     CLINDAMYCIN <=0.25 RESISTANT Resistant     RIFAMPIN <=0.5 SENSITIVE Sensitive     Inducible Clindamycin POSITIVE Resistant     * STAPHYLOCOCCUS SPECIES (COAGULASE NEGATIVE)      Radiology Studies: Ct Foot Right W Contrast  Result Date: 06/16/2016 CLINICAL DATA:  Right foot sores and erythema. Being treated for sepsis. Evaluate for osteomyelitis. History of diabetes, hypertension and left below the knee amputation. EXAM: CT OF THE RIGHT FOOT WITH CONTRAST TECHNIQUE: Multidetector CT imaging was performed following the standard protocol during  bolus administration of intravenous contrast. CONTRAST:  54mL ISOVUE-300 IOPAMIDOL (ISOVUE-300) INJECTION 61% COMPARISON:  MRI 04/27/2016 FINDINGS: Bones/Joint/Cartilage The bones are severely demineralized. There is no evidence of acute fracture or dislocation. There is mild flattening and subchondral sclerosis within the head of the second metatarsal, most consistent with Freiberg infraction. There are apparent erosive changes at the third, fourth and fifth proximal interphalangeal joints, best seen on the sagittal images. These are grossly stable from the prior MRI and may be related to arthropathy or remote infection. No progressive bone destruction identified to suggest osteomyelitis. Mild degenerative changes are present at the first metatarsal phalangeal joint. No significant joint effusions are seen. Ligaments Not relevant for exam/indication. The Lisfranc ligament appears intact. Muscles and Tendons Mild muscular atrophy. No focal intramuscular fluid collection or tendon abnormality identified. Soft tissues Mild generalized subcutaneous edema. No focal soft tissue ulceration, fluid collection, emphysema or foreign body identified. There are diffuse vascular calcifications consistent with diabetes. IMPRESSION: 1. No evidence of progressive osteomyelitis. 2. The bones are diffusely demineralized. There are grossly stable erosive changes at the third, fourth and fifth proximal interphalangeal joints compared with previous MRI. 3. Mild generalized subcutaneous edema. No evidence of abscess or myositis. Electronically Signed   By: Richardean Sale M.D.   On: 06/16/2016 07:13     Scheduled Meds: . buPROPion  150 mg Oral Daily  . ceFEPime (MAXIPIME) IV  2 g Intravenous Q12H  . clopidogrel  75 mg Oral Daily  . docusate sodium  200 mg Oral TID  . enalapril  5 mg Oral Daily  . enoxaparin (LOVENOX) injection  40 mg Subcutaneous Q24H  . feeding supplement (  PRO-STAT SUGAR FREE 64)  30 mL Oral BID  . insulin  aspart  0-9 Units Subcutaneous TID WC  . meclizine  25 mg Oral TID  . mouth rinse  15 mL Mouth Rinse BID  . mirtazapine  7.5 mg Oral QHS  . olopatadine  1 drop Both Eyes Daily  . pantoprazole  40 mg Oral Q0600  . sertraline  100 mg Oral Daily  . tamsulosin  0.4 mg Oral Daily  . vancomycin  500 mg Intravenous Q12H   Continuous Infusions:   Marzetta Board, MD, PhD [facility] Pager [phone]  If 7PM-7AM, please contact night-coverage [email or web address] Password [facility] 06/16/2016, 2:48 PM

## 2016-06-16 NOTE — Progress Notes (Signed)
      INFECTIOUS DISEASE ATTENDING ADDENDUM:   Date: 06/16/2016  Patient name: Jeremiah Baldwin  Medical record number: [id number]  Date of birth: 01-13-1946    Patient's transesophageal echocardiogram without vegetations noted.    I would discontinue his cefepime today when he completes the last dose of his fifth day. He is also had 4 days of azithromycin which has a good post antibiotic killing effect  I would continue vancomycin through tomorrow which would give him 7 days of this drug. I don't believe that he had true bacteremia. His initial culture was 1 out of 2 on admission and a second culture which grew on the 21st graft rate already had several days of vancomycin and again only grew in 1 of 2 cultures  I don't feel that he needs 2 weeks of antibiotics for this.   All sign off for now please call with further questions.       Alcide Evener 06/16/2016, 8:34 PM

## 2016-06-16 NOTE — CV Procedure (Signed)
TRANSESOPHAGEAL ECHOCARDIOGRAM (TEE) NOTE  INDICATIONS: infective endocarditis  PROCEDURE:   The patient is noted to have swallowing difficulty. He did have an MBS 2 days ago which shows no evidence of upper esophageal stricture, therefore I elected to proceed with the procedure.  Informed consent was obtained prior to the procedure. The risks, benefits and alternatives for the procedure were discussed and the patient comprehended these risks.  Risks include, but are not limited to, cough, sore throat, vomiting, nausea, somnolence, esophageal and stomach trauma or perforation, bleeding, low blood pressure, aspiration, pneumonia, infection, trauma to the teeth and death.    After a procedural time-out, the patient was given 2 mg versed and 12.5 mcg fentanyl for moderate sedation.  The patient's heart rate, blood pressure, and oxygen saturation are monitored continuously during the procedure.The oropharynx was anesthetized with 2 cetacaine sprays.  The transesophageal probe was inserted in the esophagus and stomach without difficulty and multiple views were obtained.  The patient was kept under observation until the patient left the procedure room.  The period of conscious sedation is 16 minutes, of which I was present face-to-face 100% of this time. The patient left the procedure room in stable condition.   Agitated microbubble saline contrast was administered.  COMPLICATIONS:    There were no immediate complications.  Findings:  1. LEFT VENTRICLE: The left ventricular wall thickness is normal.  The left ventricular cavity is normal in size. Wall motion is normal.  LVEF is 60-65%.  2. RIGHT VENTRICLE:  The right ventricle is normal in structure and function without any thrombus or masses.    3. LEFT ATRIUM:  The left atrium is normal in size without any thrombus or masses.  There is not spontaneous echo contrast ("smoke") in the left atrium consistent with a low flow state.  4. LEFT  ATRIAL APPENDAGE:  The left atrial appendage is free of any thrombus or masses. The appendage has single lobes. Pulse doppler indicates high flow in the appendage.  5. ATRIAL SEPTUM:  The atrial septum appears intact and is free of thrombus and/or masses.  There is no evidence for interatrial shunting by color doppler and saline microbubble.  6. RIGHT ATRIUM:  The right atrium is normal in size and function without any thrombus or masses.  7. MITRAL VALVE:  The mitral valve is normal in structure and function with trivial regurgitation.  There were no vegetations or stenosis.  8. AORTIC VALVE:  The aortic valve is trileaflet and sclerotic, with no regurgitation.  There were no vegetations or stenosis  9. TRICUSPID VALVE:  The tricuspid valve is normal in structure and function with trivial regurgitation.  There were no vegetations or stenosis  10.  PULMONIC VALVE:  The pulmonic valve is normal in structure and function with no regurgitation.  There were no vegetations or stenosis.   11. AORTIC ARCH, ASCENDING AND DESCENDING AORTA:  There was grade 1 Ron Parker et. Al, 1992) atherosclerosis of the ascending aorta, aortic arch, or proximal descending aorta.  12. PULMONARY VEINS: Anomalous pulmonary venous return was not noted.  13. PERICARDIUM: The pericardium appeared normal and non-thickened.  There is no pericardial effusion.  IMPRESSION:   1. No evidence for infective endocarditis 2. No LAA thrombus 3. Negative for PFO by color doppler and saline microbubble contrast. 4. LVEF 60-65% with normal wall motion  RECOMMENDATIONS:    1. No evidence for infective endocarditis.  2. Antibiotics per ID for bacteremia  Time Spent Directly with the Patient:  30 minutes   Pixie Casino, MD, [facility] Attending Cardiologist [facility]  06/16/2016, 12:31 PM

## 2016-06-16 NOTE — H&P (Signed)
    INTERVAL PROCEDURE H&P  History and Physical Interval Note:  06/16/2016 8:48 AM  Shanon Payor has presented today for their planned procedure. The various methods of treatment have been discussed with the patient and family. After consideration of risks, benefits and other options for treatment, the patient has consented to the procedure.  The patients' outpatient history has been reviewed, patient examined, and no change in status from most recent office note within the past 30 days. I have reviewed the patients' chart and labs and will proceed as planned. Questions were answered to the patient's satisfaction.   Pixie Casino, MD, [facility] Attending Cardiologist [place] [facility] Elany Felix 06/16/2016, 8:48 AM

## 2016-06-17 LAB — VANCOMYCIN, TROUGH: Vancomycin Tr: 15 ug/mL (ref 15–20)

## 2016-06-17 LAB — GLUCOSE, CAPILLARY
GLUCOSE-CAPILLARY: 116 mg/dL — AB (ref 65–99)
GLUCOSE-CAPILLARY: 154 mg/dL — AB (ref 65–99)

## 2016-06-17 NOTE — Progress Notes (Addendum)
Nsg Discharge Note  Admit Date:  06/11/2016 Discharge date: 06/17/2016   Shanon Payor to be D/C'd Skilled nursing facility per MD order.  AVS completed.  Copy for chart, and copy for patient signed, and dated. Patient/caregiver able to verbalize understanding.  Discharge Medication:   Medication List    TAKE these medications   buPROPion 150 MG 12 hr tablet Commonly known as:  WELLBUTRIN SR Take 150 mg by mouth daily. For depression   clopidogrel 75 MG tablet Commonly known as:  PLAVIX Take 75 mg by mouth daily.   docusate sodium 100 MG capsule Commonly known as:  COLACE Take 200 mg by mouth 3 (three) times daily. 10am, 4pm, 10pm   enalapril 5 MG tablet Commonly known as:  VASOTEC Take 5 mg by mouth daily.   feeding supplement (GLUCERNA SHAKE) Liqd Take 237 mLs by mouth See admin instructions. Offer 1 can (237 mls) daily between breakfast and lunch and 1 can 3 times daily at 10am, 2pm, 6pm   feeding supplement (PRO-STAT SUGAR FREE 64) Liqd Take 30 mLs by mouth See admin instructions. Give 30 ml by mouth twice daily - at 1pm and 5pm   ipratropium-albuterol 0.5-2.5 (3) MG/3ML Soln Commonly known as:  DUONEB Take 3 mLs by nebulization every 6 (six) hours as needed (shortness of breath/ congestion/ wheezing).   meclizine 25 MG tablet Commonly known as:  ANTIVERT Take 25 mg by mouth 3 (three) times daily. For dizziness - 10am, 4pm, 10pm   metFORMIN 1000 MG tablet Commonly known as:  GLUCOPHAGE Take 1,000 mg by mouth 2 (two) times daily with a meal. 10am, 7pm   mirtazapine 15 MG tablet Commonly known as:  REMERON Take 7.5 mg by mouth at bedtime.   olopatadine 0.1 % ophthalmic solution Commonly known as:  PATANOL Place 1 drop into both eyes daily.   ondansetron 4 MG tablet Commonly known as:  ZOFRAN Take 4 mg by mouth every 6 (six) hours as needed for nausea or vomiting.   pantoprazole 40 MG tablet Commonly known as:  PROTONIX Take 40 mg by mouth daily at 6  (six) AM. For reflux   sertraline 100 MG tablet Commonly known as:  ZOLOFT Take 100 mg by mouth daily.   tamsulosin 0.4 MG Caps capsule Commonly known as:  FLOMAX Take 0.4 mg by mouth daily.   TOUJEO SOLOSTAR 300 UNIT/ML Sopn Generic drug:  Insulin Glargine Inject 10 Units into the skin at bedtime.       Discharge Assessment: Vitals:   06/16/16 2236 06/17/16 0522  BP: 118/60 117/72  Pulse: 89 84  Resp: 18 18  Temp: 98.6 F (37 C) 98.6 F (37 C)   Skin clean, dry and intact without evidence of skin break down, no evidence of skin tears noted. IV catheter discontinued intact. Site without signs and symptoms of complications - no redness or edema noted at insertion site, patient denies c/o pain - only slight tenderness at site.  Dressing with slight pressure applied.  D/c Instructions-Education: Discharge instructions given to patient/family with verbalized understanding. D/c education completed with patient/family including follow up instructions, medication list, d/c activities limitations if indicated, with other d/c instructions as indicated by MD - patient able to verbalize understanding, all questions fully answered. Patient instructed to return to ED, call 911, or call MD for any changes in condition.  Patient transported via [facility] to [facility]. Attempted report at 12:30. Transferred to appropriate hall but no one answered.  Attempted report again. Still  no answer on hall. 12:55  Salley Slaughter, RN 06/17/2016 12:30 PM

## 2016-06-17 NOTE — Clinical Social Work Note (Signed)
Per MD patient ready to DC back to [facility]. RN, patient/family (wife at bedside), and facility notified of patient's DC. RN given number for report. DC packet on patient's chart. Ambulance transport requested for patient for 1PM pickup. CSW signing off at this time.   Liz Beach MSW, [place], [place], JI:[phone]

## 2016-06-17 NOTE — Discharge Summary (Signed)
Physician Discharge Summary  Jeremiah Baldwin [id number] DOB: 1945-12-02 DOA: 06/11/2016  PCP: No primary care provider on file.  Admit date: 06/11/2016 Discharge date: 06/17/2016  Admitted From: SNF Disposition:  SNF  Recommendations for Outpatient Follow-up:  1. Follow up with PCP in 1-2 weeks  Discharge Condition: stable CODE STATUS: DNR Diet recommendation: dysphagia 3, thin liquid  HPI: Per Sherrilyn Rist, Jeremiah Baldwin is a 70 y.o. male history of CAD, left BKA secondary to osteomyelitis, CVA with left-sided weakness brought to the ER for respiratory distress with hypoxia, all sats in the 70s, sinus tachycardia. The patient was placed on CPAP and IV fluids, with improvement in the ER. Family reports that he was already having trouble breathing over the last couple of days, worse on presentation. The patient is very frail, and the history is being obtained by family. He looks very malnourished. At the time of evaluation, he is not showing work of breathing. His blood pressure is now normal, and his heart rate is trending. Other information cannot be obtained, due to the patient's inability to engage in conversation.  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 lacticacid was up to 3.4 - WBC improving, lactic acid improving - Left lower lobe atelectasis or pneumonia on CXRm finished 7 days of antibiotics while hospitalized. ID followed. - Influenza PCR negative - Blood cultures  06/11/2016: 1/2 Coag negative staph. Staph aureus negative. Repeat cultures on 11/21 with coag negative staph, ID consulted, felt like patient did not have true bacteremia and recommended discontinuation of IV antibiotics as patient has received 7 days.  - TEE done negative for endocarditis. Appreciate cardiology. CT scan of the right foot without any evidence of progressive osteomyelitis. No further antibiotics needed per ID Acute hypoxic respiratory failure - Upon admission, oxygen saturations are to be in the 70s - Possibly secondary to pneumonia, improved, now on room air and without respiratory issues. Diabetes mellitus, type II - resume home medications History of of CVA with left-sided residual weakness - Continue Plavix Depression - Continue Zoloft, Wellbutrin GERD - Continue PPI ?dysphagia vs aspiration - Speech consulted and appreciated - had modified barium swallow: dysphagia 3, thin liquid. Continue aspiration precautions Normocytic anemia - stable.   Discharge Instructions     Medication List    TAKE these medications   buPROPion 150 MG 12 hr tablet Commonly known as:  WELLBUTRIN SR Take 150 mg by mouth daily. For depression   clopidogrel 75 MG tablet Commonly known as:  PLAVIX Take 75 mg by mouth daily.   docusate sodium 100 MG capsule Commonly known as:  COLACE Take 200 mg by mouth 3 (three) times daily. 10am, 4pm, 10pm   enalapril 5 MG tablet Commonly known as:  VASOTEC Take 5 mg by mouth daily.   feeding supplement (GLUCERNA SHAKE) Liqd Take 237 mLs by mouth See admin instructions. Offer 1 can (237 mls) daily between breakfast and lunch and 1 can 3 times daily at 10am, 2pm, 6pm   feeding supplement (PRO-STAT SUGAR FREE 64) Liqd Take  30 mLs by mouth See admin instructions. Give 30 ml by mouth twice daily - at 1pm and 5pm   ipratropium-albuterol 0.5-2.5 (3) MG/3ML Soln Commonly known as:  DUONEB Take 3 mLs by nebulization every 6 (six) hours as  needed (shortness of breath/ congestion/ wheezing).   meclizine 25 MG tablet Commonly known as:  ANTIVERT Take 25 mg by mouth 3 (three) times daily. For dizziness - 10am, 4pm, 10pm   metFORMIN 1000 MG tablet Commonly known as:  GLUCOPHAGE Take 1,000 mg by mouth 2 (two) times daily with a meal. 10am, 7pm   mirtazapine 15 MG tablet Commonly known as:  REMERON Take 7.5 mg by mouth at bedtime.   olopatadine 0.1 % ophthalmic solution Commonly known as:  PATANOL Place 1 drop into both eyes daily.   ondansetron 4 MG tablet Commonly known as:  ZOFRAN Take 4 mg by mouth every 6 (six) hours as needed for nausea or vomiting.   pantoprazole 40 MG tablet Commonly known as:  PROTONIX Take 40 mg by mouth daily at 6 (six) AM. For reflux   sertraline 100 MG tablet Commonly known as:  ZOLOFT Take 100 mg by mouth daily.   tamsulosin 0.4 MG Caps capsule Commonly known as:  FLOMAX Take 0.4 mg by mouth daily.   TOUJEO SOLOSTAR 300 UNIT/ML Sopn Generic drug:  Insulin Glargine Inject 10 Units into the skin at bedtime.      Contact information for after-discharge care    Destination    [facility] Follow up.   Specialty:  [place] information: [place] [phone]             Allergies  Allergen Reactions  . Codeine Other (See Comments)    Listed on [facility] 06/11/16- unknown reaction    Consultations:  ID  Procedures/Studies:  2D echo  TEE  Dg Abdomen 1 View  Result Date: 06/11/2016 CLINICAL DATA:  70 year old male left-side-down lateral decubitus view for possible free air. Initial encounter. EXAM: ABDOMEN - 1 VIEW COMPARISON:  CT Abdomen and Pelvis 04/23/2016. FINDINGS: No pneumoperitoneum. Visualized bowel gas pattern is non obstructed. Negative visible right lung base. No acute osseous abnormality identified. IMPRESSION: Negative for free air.  Bowel gas pattern appears normal.  Electronically Signed   By: Genevie Ann M.D.   On: 06/11/2016 16:32   Ct Foot Right W Contrast  Result Date: 06/16/2016 CLINICAL DATA:  Right foot sores and erythema. Being treated for sepsis. Evaluate for osteomyelitis. History of diabetes, hypertension and left below the knee amputation. EXAM: CT OF THE RIGHT FOOT WITH CONTRAST TECHNIQUE: Multidetector CT imaging was performed following the standard protocol during bolus administration of intravenous contrast. CONTRAST:  63mL ISOVUE-300 IOPAMIDOL (ISOVUE-300) INJECTION 61% COMPARISON:  MRI 04/27/2016 FINDINGS: Bones/Joint/Cartilage The bones are severely demineralized. There is no evidence of acute fracture or dislocation. There is mild flattening and subchondral sclerosis within the head of the second metatarsal, most consistent with [place] infraction. There are apparent erosive changes at the third, fourth and fifth proximal interphalangeal joints, best seen on the sagittal images. These are grossly stable from the prior MRI and may be related to arthropathy or remote infection. No progressive bone destruction identified to suggest osteomyelitis. Mild degenerative changes are present at the first metatarsal phalangeal joint. No significant joint effusions are seen. Ligaments Not relevant for exam/indication. The Lisfranc ligament appears intact. Muscles and Tendons Mild muscular atrophy. No focal intramuscular fluid collection or tendon  abnormality identified. Soft tissues Mild generalized subcutaneous edema. No focal soft tissue ulceration, fluid collection, emphysema or foreign body identified. There are diffuse vascular calcifications consistent with diabetes. IMPRESSION: 1. No evidence of progressive osteomyelitis. 2. The bones are diffusely demineralized. There are grossly stable erosive changes at the third, fourth and fifth proximal interphalangeal joints compared with previous MRI. 3. Mild generalized subcutaneous edema. No evidence of abscess or  myositis. Electronically Signed   By: Richardean Sale M.D.   On: 06/16/2016 07:13   Dg Chest Port 1 View  Result Date: 06/14/2016 CLINICAL DATA:  New cough, possible aspiration EXAM: PORTABLE CHEST 1 VIEW COMPARISON:  06/12/2016 FINDINGS: Cardiomediastinal silhouette is stable. There is streaky right basilar atelectasis or infiltrate. Aspiration pneumonia cannot be excluded. No pulmonary edema. Again noted mottled appearance with some cortical sclerosis left humeral shaft. Metastatic disease cannot be excluded. Clinical correlation is necessary there is probable old fracture deformity left humeral neck. IMPRESSION: There is streaky right basilar atelectasis or infiltrate. Aspiration pneumonia cannot be excluded. No pulmonary edema. Again noted mottled appearance with some cortical sclerosis left humeral shaft. Metastatic disease cannot be excluded. Clinical correlation is necessary there is probable old fracture deformity left humeral neck. Electronically Signed   By: Lahoma Crocker M.D.   On: 06/14/2016 12:33   Dg Chest Port 1 View  Result Date: 06/12/2016 CLINICAL DATA:  Sepsis, history of coronary artery disease, diabetes, previous CVA EXAM: PORTABLE CHEST 1 VIEW COMPARISON:  Portable chest x-ray of June 11, 2016 and April 24, 2016. FINDINGS: The lungs are less well inflated today. There is patchy density at the left lung base obscuring the hemidiaphragm. The interstitial markings in the right lung are mildly increased though stable. The heart and pulmonary vascularity are normal. The mediastinum is normal in width. The observed bony thorax is unremarkable. IMPRESSION: Left lower lobe atelectasis or pneumonia more conspicuous today. Mild stable interstitial prominence on the right may reflect interstitial edema or subsegmental atelectasis. Electronically Signed   By: David  Martinique M.D.   On: 06/12/2016 07:49   Dg Chest Portable 1 View  Result Date: 06/11/2016 CLINICAL DATA:  Reason for exam:  respiratory distress. Medical hx: diabetes, hypertension. Pt is paralyzed on left side; pt has ETT. EXAM: PORTABLE CHEST 1 VIEW COMPARISON:  04/24/2016 FINDINGS: Position degraded exam, with to patient rotated to the right and chin overlying the apex of the right upper lobe. Normal heart size. Moderate left hemidiaphragm elevation. No pleural effusion or pneumothorax. Improved left base aeration with mild volume loss remaining. No well-defined consolidation identified. Interval removal of right PICC line. IMPRESSION: Left hemidiaphragm elevation with improved left base aeration. No acute findings. Decreased sensitivity and specificity exam due to technique related factors, as described above. No endotracheal tube identified.  Correlate with clinical history. Electronically Signed   By: Abigail Miyamoto M.D.   On: 06/11/2016 12:49   Dg Swallowing Func-speech Pathology  Result Date: 06/13/2016 Objective Swallowing Evaluation: Type of Study: MBS-Modified Barium Swallow Study Patient Details Name: Jeremiah Baldwin MRN: [id number] Date of Birth: 1946/04/13 Today's Date: 06/13/2016 Time: SLP Start Time (ACUTE ONLY): 0930-SLP Stop Time (ACUTE ONLY): 0950 SLP Time Calculation (min) (ACUTE ONLY): 20 min Past Medical History: Past Medical History: Diagnosis Date . Allergy  . Anorexia  . CAD (coronary artery disease)  . Colon polyp  . Diabetes mellitus without complication ([place])  . Diabetic retinopathy ([place])  . GERD (gastroesophageal reflux disease)  . Hyperlipidemia  . Hypertension  . Left hemiparesis ([place])  .  PVD (peripheral vascular disease) ([place])  . Stroke [facility])  Past Surgical History: Past Surgical History: Procedure Laterality Date . enteroscopic laser photocoagulation Left 2001 . left bka   HPI: 70 y.o. male from SNF admitted with PNA and Sepsis. Pt with hx of CVA with left hemiparesis, L BKA, CAD, Anorexia, DM, Retinopathy, HTN, and PVD.  Subjective: alert, poor spontaneity, limited speech today Assessment / Plan /  Recommendation CHL IP CLINICAL IMPRESSIONS 06/13/2016 Therapy Diagnosis Moderate oral phase dysphagia Clinical Impression Pt presented with a primarily oral dysphagia with excessive oral holding of POs, requiring moderate cueing to initiate mastication/oral movement.  The swallow was delayed to the pyriform sinuses for puree and thin liquids, but once triggered, pharyngeal clearance was normal and there was adequate airway protection.  (No penetration nor aspiration noted, despite consumption of large, successive thin liquid boluses.)  Pt's performance during MBS is improved since clinical assessment yesterday.  Recommend advancing liquids back to thins, maintain dysphagia 3 consistency.  SLP will follow to ensure functional toleration of this diet.  Impact on safety and function Moderate aspiration risk   CHL IP TREATMENT RECOMMENDATION 06/12/2016 Treatment Recommendations Therapy as outlined in treatment plan below   Prognosis 06/13/2016 Prognosis for Safe Diet Advancement Fair Barriers to Reach Goals Cognitive deficits Barriers/Prognosis Comment -- CHL IP DIET RECOMMENDATION 06/13/2016 SLP Diet Recommendations Dysphagia 3 (Mech soft) solids;Thin liquid Liquid Administration via Cup;Straw Medication Administration Whole meds with puree Compensations Slow rate;Small sips/bites;Monitor for anterior loss Postural Changes --   CHL IP OTHER RECOMMENDATIONS 06/13/2016 Recommended Consults -- Oral Care Recommendations Oral care BID Other Recommendations --   CHL IP FOLLOW UP RECOMMENDATIONS 06/13/2016 Follow up Recommendations Skilled Nursing facility   [facility] IP FREQUENCY AND DURATION 06/13/2016 Speech Therapy Frequency (ACUTE ONLY) min 3x week Treatment Duration 1 week      CHL IP ORAL PHASE 06/13/2016 Oral Phase Impaired Oral - Pudding Teaspoon -- Oral - Pudding Cup -- Oral - Honey Teaspoon -- Oral - Honey Cup -- Oral - Nectar Teaspoon -- Oral - Nectar Cup -- Oral - Nectar Straw -- Oral - Thin Teaspoon -- Oral - Thin Cup  -- Oral - Thin Straw Right anterior bolus loss;Weak lingual manipulation;Holding of bolus;Delayed oral transit;Decreased bolus cohesion Oral - Puree Weak lingual manipulation;Holding of bolus;Delayed oral transit;Decreased bolus cohesion Oral - Mech Soft -- Oral - Regular -- Oral - Multi-Consistency -- Oral - Pill -- Oral Phase - Comment --  CHL IP PHARYNGEAL PHASE 06/13/2016 Pharyngeal Phase Impaired Pharyngeal- Pudding Teaspoon -- Pharyngeal -- Pharyngeal- Pudding Cup -- Pharyngeal -- Pharyngeal- Honey Teaspoon -- Pharyngeal -- Pharyngeal- Honey Cup -- Pharyngeal -- Pharyngeal- Nectar Teaspoon -- Pharyngeal -- Pharyngeal- Nectar Cup -- Pharyngeal -- Pharyngeal- Nectar Straw -- Pharyngeal -- Pharyngeal- Thin Teaspoon -- Pharyngeal -- Pharyngeal- Thin Cup -- Pharyngeal -- Pharyngeal- Thin Straw Delayed swallow initiation-pyriform sinuses Pharyngeal -- Pharyngeal- Puree Delayed swallow initiation-pyriform sinuses Pharyngeal -- Pharyngeal- Mechanical Soft -- Pharyngeal -- Pharyngeal- Regular -- Pharyngeal -- Pharyngeal- Multi-consistency -- Pharyngeal -- Pharyngeal- Pill -- Pharyngeal -- Pharyngeal Comment --  No flowsheet data found. No flowsheet data found. [name] 06/13/2016, 10:08 AM                 Subjective: - no chest pain, shortness of breath, no abdominal pain, nausea or vomiting.   Discharge Exam: Vitals:   06/16/16 2236 06/17/16 0522  BP: 118/60 117/72  Pulse: 89 84  Resp: 18 18  Temp: 98.6 F (37 C) 98.6 F (37  C)   Vitals:   06/16/16 1250 06/16/16 1457 06/16/16 2236 06/17/16 0522  BP: (!) 90/46 (!) 109/56 118/60 117/72  Pulse: 81 93 89 84  Resp: 20 20 18 18   Temp:   98.6 F (37 C) 98.6 F (37 C)  TempSrc:   Oral Oral  SpO2: 98% 92% 99% 100%  Weight:      Height:        General: Pt is alert, awake, not in acute distress Cardiovascular: RRR, S1/S2 +, no rubs, no gallops Respiratory: CTA bilaterally, no wheezing, no rhonchi Abdominal: Soft, NT, ND, bowel  sounds + Extremities: no edema, no cyanosis    The results of significant diagnostics from this hospitalization (including imaging, microbiology, ancillary and laboratory) are listed below for reference.     Microbiology: Recent Results (from the past 240 hour(s))  Culture, blood (x 2)     Status: Abnormal   Collection Time: 06/11/16 12:15 PM  Result Value Ref Range Status   Specimen Description BLOOD RIGHT WRIST  Final   Special Requests BOTTLES DRAWN AEROBIC AND ANAEROBIC 5CC  Final   Culture  Setup Time   Final    GRAM POSITIVE COCCI IN CLUSTERS AEROBIC BOTTLE ONLY CRITICAL RESULT CALLED TO, READ BACK BY AND VERIFIED WITH: A. ANDERSON, PHARMD AT 1330 ON 06/12/16 BY C. JESSUP, MLT.    Culture (A)  Final    STAPHYLOCOCCUS SPECIES (COAGULASE NEGATIVE) SUSCEPTIBILITIES PERFORMED ON PREVIOUS CULTURE WITHIN THE LAST 5 DAYS.    Report Status 06/15/2016 FINAL  Final  Blood Culture ID Panel (Reflexed)     Status: Abnormal   Collection Time: 06/11/16 12:15 PM  Result Value Ref Range Status   Enterococcus species NOT DETECTED NOT DETECTED Final   Listeria monocytogenes NOT DETECTED NOT DETECTED Final   Staphylococcus species DETECTED (A) NOT DETECTED Final    Comment: CRITICAL RESULT CALLED TO, READ BACK BY AND VERIFIED WITH: A. ANDERSON, PHARMD AT 1330 ON 06/12/16 BY C. JESSUP, MLT.    Staphylococcus aureus NOT DETECTED NOT DETECTED Final   Methicillin resistance DETECTED (A) NOT DETECTED Final    Comment: CRITICAL RESULT CALLED TO, READ BACK BY AND VERIFIED WITH: A. ANDERSON, PHARMD AT 1330 ON 06/12/16 BY C. JESSUP, MLT.    Streptococcus species NOT DETECTED NOT DETECTED Final   Streptococcus agalactiae NOT DETECTED NOT DETECTED Final   Streptococcus pneumoniae NOT DETECTED NOT DETECTED Final   Streptococcus pyogenes NOT DETECTED NOT DETECTED Final   Acinetobacter baumannii NOT DETECTED NOT DETECTED Final   Enterobacteriaceae species NOT DETECTED NOT DETECTED Final    Enterobacter cloacae complex NOT DETECTED NOT DETECTED Final   Escherichia coli NOT DETECTED NOT DETECTED Final   Klebsiella oxytoca NOT DETECTED NOT DETECTED Final   Klebsiella pneumoniae NOT DETECTED NOT DETECTED Final   Proteus species NOT DETECTED NOT DETECTED Final   Serratia marcescens NOT DETECTED NOT DETECTED Final   Haemophilus influenzae NOT DETECTED NOT DETECTED Final   Neisseria meningitidis NOT DETECTED NOT DETECTED Final   Pseudomonas aeruginosa NOT DETECTED NOT DETECTED Final   Candida albicans NOT DETECTED NOT DETECTED Final   Candida glabrata NOT DETECTED NOT DETECTED Final   Candida krusei NOT DETECTED NOT DETECTED Final   Candida parapsilosis NOT DETECTED NOT DETECTED Final   Candida tropicalis NOT DETECTED NOT DETECTED Final  Urine culture     Status: Abnormal   Collection Time: 06/11/16  5:02 PM  Result Value Ref Range Status   Specimen Description URINE, CATHETERIZED  Final  Special Requests NONE  Final   Culture >=100,000 COLONIES/mL YEAST (A)  Final   Report Status 06/13/2016 FINAL  Final  MRSA PCR Screening     Status: Abnormal   Collection Time: 06/11/16  6:30 PM  Result Value Ref Range Status   MRSA by PCR POSITIVE (A) NEGATIVE Final    Comment:        The GeneXpert MRSA Assay (FDA approved for NASAL specimens only), is one component of a comprehensive MRSA colonization surveillance program. It is not intended to diagnose MRSA infection nor to guide or monitor treatment for MRSA infections. RESULT CALLED TO, READ BACK BY AND VERIFIED WITH: BALDWIN,O RN 06/11/16 AT 2030 SKEEN,P   Culture, blood (x 2)     Status: None   Collection Time: 06/11/16  6:35 PM  Result Value Ref Range Status   Specimen Description BLOOD RIGHT ARM  Final   Special Requests BOTTLES DRAWN AEROBIC AND ANAEROBIC 5ML EACH  Final   Culture NO GROWTH 5 DAYS  Final   Report Status 06/16/2016 FINAL  Final  Culture, blood (routine x 2)     Status: None (Preliminary result)    Collection Time: 06/13/16 12:17 AM  Result Value Ref Range Status   Specimen Description BLOOD RIGHT ANTECUBITAL  Final   Special Requests IN PEDIATRIC BOTTLE 1CC  Final   Culture NO GROWTH 3 DAYS  Final   Report Status PENDING  Incomplete  Culture, blood (routine x 2)     Status: Abnormal   Collection Time: 06/13/16 12:22 AM  Result Value Ref Range Status   Specimen Description BLOOD LEFT HAND  Final   Special Requests IN PEDIATRIC BOTTLE 1CC  Final   Culture  Setup Time   Final    GRAM POSITIVE COCCI IN CLUSTERS AEROBIC BOTTLE ONLY CRITICAL VALUE NOTED.  VALUE IS CONSISTENT WITH PREVIOUSLY REPORTED AND CALLED VALUE.    Culture STAPHYLOCOCCUS SPECIES (COAGULASE NEGATIVE) (A)  Final   Report Status 06/15/2016 FINAL  Final   Organism ID, Bacteria STAPHYLOCOCCUS SPECIES (COAGULASE NEGATIVE)  Final      Susceptibility   Staphylococcus species (coagulase negative) - MIC*    CIPROFLOXACIN 4 RESISTANT Resistant     ERYTHROMYCIN >=8 RESISTANT Resistant     GENTAMICIN <=0.5 SENSITIVE Sensitive     OXACILLIN >=4 RESISTANT Resistant     TETRACYCLINE >=16 RESISTANT Resistant     VANCOMYCIN <=0.5 SENSITIVE Sensitive     TRIMETH/SULFA 40 SENSITIVE Sensitive     CLINDAMYCIN <=0.25 RESISTANT Resistant     RIFAMPIN <=0.5 SENSITIVE Sensitive     Inducible Clindamycin POSITIVE Resistant     * STAPHYLOCOCCUS SPECIES (COAGULASE NEGATIVE)     Labs: BNP (last 3 results)  Recent Labs  06/11/16 1243  BNP [id number]*   Basic Metabolic Panel:  Recent Labs Lab 06/11/16 1243 06/11/16 1835 06/13/16 0023 06/14/16 1422 06/16/16 0609  NA 140 137 133* 137 140  K 4.2 4.3 3.8 3.7 3.6  CL 104 101 101 103 105  CO2 23 23 23 26 27   GLUCOSE 290* 279* 205* 187* 141*  BUN 29* 32* 28* 20 18  CREATININE 1.03 0.99 0.87 0.59* 0.68  CALCIUM 9.2 9.2 8.5* 8.3* 8.7*   Liver Function Tests:  Recent Labs Lab 06/11/16 1243 06/11/16 1835  AST 18 18  ALT 9* 10*  ALKPHOS 97 97  BILITOT 0.8 0.7  PROT 6.7 7.0   ALBUMIN 3.1* 3.1*   No results for input(s): LIPASE, AMYLASE in the last 168 hours. No  results for input(s): AMMONIA in the last 168 hours. CBC:  Recent Labs Lab 06/11/16 1243 06/11/16 1835 06/13/16 0023 06/14/16 1422 06/16/16 0609  WBC 22.9* 19.7* 14.1* 6.3 7.3  NEUTROABS 19.3* 17.9*  --   --   --   HGB 11.3* 11.4* 9.5* 8.6* 8.5*  HCT 35.6* 35.2* 29.5* 26.8* 26.9*  MCV 84.2 82.4 81.7 83.2 83.3  PLT 292 241 189 142* 182   Cardiac Enzymes:  Recent Labs Lab 06/11/16 1243 06/11/16 1835  TROPONINI 0.10* <0.03   BNP: Invalid input(s): POCBNP CBG:  Recent Labs Lab 06/15/16 2346 06/16/16 0833 06/16/16 1347 06/16/16 1725 06/17/16 0904  GLUCAP 139* 157* 111* 166* 116*   D-Dimer No results for input(s): DDIMER in the last 72 hours. Hgb A1c No results for input(s): HGBA1C in the last 72 hours. Lipid Profile No results for input(s): CHOL, HDL, LDLCALC, TRIG, CHOLHDL, LDLDIRECT in the last 72 hours. Thyroid function studies No results for input(s): TSH, T4TOTAL, T3FREE, THYROIDAB in the last 72 hours.  Invalid input(s): FREET3 Anemia work up No results for input(s): VITAMINB12, FOLATE, FERRITIN, TIBC, IRON, RETICCTPCT in the last 72 hours. Urinalysis    Component Value Date/Time   COLORURINE YELLOW 06/11/2016 1701   APPEARANCEUR CLOUDY (A) 06/11/2016 1701   LABSPEC 1.018 06/11/2016 1701   PHURINE 5.5 06/11/2016 1701   GLUCOSEU 250 (A) 06/11/2016 1701   HGBUR SMALL (A) 06/11/2016 1701   BILIRUBINUR NEGATIVE 06/11/2016 1701   KETONESUR NEGATIVE 06/11/2016 1701   PROTEINUR NEGATIVE 06/11/2016 1701   NITRITE NEGATIVE 06/11/2016 1701   LEUKOCYTESUR LARGE (A) 06/11/2016 1701   Sepsis Labs Invalid input(s): PROCALCITONIN,  WBC,  LACTICIDVEN Microbiology Recent Results (from the past 240 hour(s))  Culture, blood (x 2)     Status: Abnormal   Collection Time: 06/11/16 12:15 PM  Result Value Ref Range Status   Specimen Description BLOOD RIGHT WRIST  Final    Special Requests BOTTLES DRAWN AEROBIC AND ANAEROBIC 5CC  Final   Culture  Setup Time   Final    GRAM POSITIVE COCCI IN CLUSTERS AEROBIC BOTTLE ONLY CRITICAL RESULT CALLED TO, READ BACK BY AND VERIFIED WITH: A. ANDERSON, PHARMD AT 1330 ON 06/12/16 BY C. JESSUP, MLT.    Culture (A)  Final    STAPHYLOCOCCUS SPECIES (COAGULASE NEGATIVE) SUSCEPTIBILITIES PERFORMED ON PREVIOUS CULTURE WITHIN THE LAST 5 DAYS.    Report Status 06/15/2016 FINAL  Final  Blood Culture ID Panel (Reflexed)     Status: Abnormal   Collection Time: 06/11/16 12:15 PM  Result Value Ref Range Status   Enterococcus species NOT DETECTED NOT DETECTED Final   Listeria monocytogenes NOT DETECTED NOT DETECTED Final   Staphylococcus species DETECTED (A) NOT DETECTED Final    Comment: CRITICAL RESULT CALLED TO, READ BACK BY AND VERIFIED WITH: A. ANDERSON, PHARMD AT 1330 ON 06/12/16 BY C. JESSUP, MLT.    Staphylococcus aureus NOT DETECTED NOT DETECTED Final   Methicillin resistance DETECTED (A) NOT DETECTED Final    Comment: CRITICAL RESULT CALLED TO, READ BACK BY AND VERIFIED WITH: A. ANDERSON, PHARMD AT 1330 ON 06/12/16 BY C. JESSUP, MLT.    Streptococcus species NOT DETECTED NOT DETECTED Final   Streptococcus agalactiae NOT DETECTED NOT DETECTED Final   Streptococcus pneumoniae NOT DETECTED NOT DETECTED Final   Streptococcus pyogenes NOT DETECTED NOT DETECTED Final   Acinetobacter baumannii NOT DETECTED NOT DETECTED Final   Enterobacteriaceae species NOT DETECTED NOT DETECTED Final   Enterobacter cloacae complex NOT DETECTED NOT DETECTED Final  Escherichia coli NOT DETECTED NOT DETECTED Final   Klebsiella oxytoca NOT DETECTED NOT DETECTED Final   Klebsiella pneumoniae NOT DETECTED NOT DETECTED Final   Proteus species NOT DETECTED NOT DETECTED Final   Serratia marcescens NOT DETECTED NOT DETECTED Final   Haemophilus influenzae NOT DETECTED NOT DETECTED Final   Neisseria meningitidis NOT DETECTED NOT DETECTED Final    Pseudomonas aeruginosa NOT DETECTED NOT DETECTED Final   Candida albicans NOT DETECTED NOT DETECTED Final   Candida glabrata NOT DETECTED NOT DETECTED Final   Candida krusei NOT DETECTED NOT DETECTED Final   Candida parapsilosis NOT DETECTED NOT DETECTED Final   Candida tropicalis NOT DETECTED NOT DETECTED Final  Urine culture     Status: Abnormal   Collection Time: 06/11/16  5:02 PM  Result Value Ref Range Status   Specimen Description URINE, CATHETERIZED  Final   Special Requests NONE  Final   Culture >=100,000 COLONIES/mL YEAST (A)  Final   Report Status 06/13/2016 FINAL  Final  MRSA PCR Screening     Status: Abnormal   Collection Time: 06/11/16  6:30 PM  Result Value Ref Range Status   MRSA by PCR POSITIVE (A) NEGATIVE Final    Comment:        The GeneXpert MRSA Assay (FDA approved for NASAL specimens only), is one component of a comprehensive MRSA colonization surveillance program. It is not intended to diagnose MRSA infection nor to guide or monitor treatment for MRSA infections. RESULT CALLED TO, READ BACK BY AND VERIFIED WITH: BALDWIN,O RN 06/11/16 AT 2030 SKEEN,P   Culture, blood (x 2)     Status: None   Collection Time: 06/11/16  6:35 PM  Result Value Ref Range Status   Specimen Description BLOOD RIGHT ARM  Final   Special Requests BOTTLES DRAWN AEROBIC AND ANAEROBIC 5ML EACH  Final   Culture NO GROWTH 5 DAYS  Final   Report Status 06/16/2016 FINAL  Final  Culture, blood (routine x 2)     Status: None (Preliminary result)   Collection Time: 06/13/16 12:17 AM  Result Value Ref Range Status   Specimen Description BLOOD RIGHT ANTECUBITAL  Final   Special Requests IN PEDIATRIC BOTTLE 1CC  Final   Culture NO GROWTH 3 DAYS  Final   Report Status PENDING  Incomplete  Culture, blood (routine x 2)     Status: Abnormal   Collection Time: 06/13/16 12:22 AM  Result Value Ref Range Status   Specimen Description BLOOD LEFT HAND  Final   Special Requests IN PEDIATRIC BOTTLE  1CC  Final   Culture  Setup Time   Final    GRAM POSITIVE COCCI IN CLUSTERS AEROBIC BOTTLE ONLY CRITICAL VALUE NOTED.  VALUE IS CONSISTENT WITH PREVIOUSLY REPORTED AND CALLED VALUE.    Culture STAPHYLOCOCCUS SPECIES (COAGULASE NEGATIVE) (A)  Final   Report Status 06/15/2016 FINAL  Final   Organism ID, Bacteria STAPHYLOCOCCUS SPECIES (COAGULASE NEGATIVE)  Final      Susceptibility   Staphylococcus species (coagulase negative) - MIC*    CIPROFLOXACIN 4 RESISTANT Resistant     ERYTHROMYCIN >=8 RESISTANT Resistant     GENTAMICIN <=0.5 SENSITIVE Sensitive     OXACILLIN >=4 RESISTANT Resistant     TETRACYCLINE >=16 RESISTANT Resistant     VANCOMYCIN <=0.5 SENSITIVE Sensitive     TRIMETH/SULFA 40 SENSITIVE Sensitive     CLINDAMYCIN <=0.25 RESISTANT Resistant     RIFAMPIN <=0.5 SENSITIVE Sensitive     Inducible Clindamycin POSITIVE Resistant     *  STAPHYLOCOCCUS SPECIES (COAGULASE NEGATIVE)     Time coordinating discharge: Over 30 minutes  SIGNED:  Marzetta Board, MD  [facility] 06/17/2016, 9:41 AM Pager [phone]  If 7PM-7AM, please contact night-coverage [email or web address] Password [email or web address]

## 2016-06-18 LAB — CULTURE, BLOOD (ROUTINE X 2): Culture: NO GROWTH

## 2016-06-19 ENCOUNTER — Non-Acute Institutional Stay (SKILLED_NURSING_FACILITY): Payer: Medicare Other | Admitting: Internal Medicine

## 2016-06-19 ENCOUNTER — Encounter (HOSPITAL_COMMUNITY): Payer: Self-pay | Admitting: Internal Medicine

## 2016-06-19 DIAGNOSIS — J181 Lobar pneumonia, unspecified organism: Secondary | ICD-10-CM

## 2016-06-19 DIAGNOSIS — K219 Gastro-esophageal reflux disease without esophagitis: Secondary | ICD-10-CM | POA: Diagnosis not present

## 2016-06-19 DIAGNOSIS — A419 Sepsis, unspecified organism: Secondary | ICD-10-CM | POA: Diagnosis not present

## 2016-06-19 DIAGNOSIS — J189 Pneumonia, unspecified organism: Secondary | ICD-10-CM

## 2016-06-19 DIAGNOSIS — B370 Candidal stomatitis: Secondary | ICD-10-CM | POA: Diagnosis not present

## 2016-06-19 DIAGNOSIS — D696 Thrombocytopenia, unspecified: Secondary | ICD-10-CM | POA: Diagnosis not present

## 2016-06-19 DIAGNOSIS — I69959 Hemiplegia and hemiparesis following unspecified cerebrovascular disease affecting unspecified side: Secondary | ICD-10-CM | POA: Diagnosis not present

## 2016-06-19 DIAGNOSIS — F329 Major depressive disorder, single episode, unspecified: Secondary | ICD-10-CM

## 2016-06-19 DIAGNOSIS — Z794 Long term (current) use of insulin: Secondary | ICD-10-CM

## 2016-06-19 DIAGNOSIS — J9601 Acute respiratory failure with hypoxia: Secondary | ICD-10-CM | POA: Diagnosis not present

## 2016-06-19 DIAGNOSIS — D638 Anemia in other chronic diseases classified elsewhere: Secondary | ICD-10-CM | POA: Diagnosis not present

## 2016-06-19 DIAGNOSIS — E118 Type 2 diabetes mellitus with unspecified complications: Secondary | ICD-10-CM | POA: Diagnosis not present

## 2016-06-19 DIAGNOSIS — F32A Depression, unspecified: Secondary | ICD-10-CM

## 2016-06-19 NOTE — Progress Notes (Addendum)
: Provider:  Noah Delaine. Sheppard Coil, MD Location:  [facility] Room Number: [phone] Place of Service:  SNF (31)  PCP:   Extended Emergency Contact Information Primary Emergency Contact: Laffoon,Carolyn Address: [address]          [place] [address] [place] Phone: [phone] Mobile Phone: [phone] Relation: None     Allergies: Codeine  Chief Complaint  Patient presents with  . New Admit To SNF    Admit to Facility    HPI: Patient is 70 y.o. male with CAD, left BKA secondary to osteomyelitis, CVA with left-sided weakness brought to the ER for respiratory distress with hypoxia, all sats in the 70s, sinus tachycardia. The patient was placed on CPAP and IV fluids, with improvement in the ER. Family reports that he was already having trouble breathing over the last couple of days, worse on presentation. Pt was admitted to [facility] from 11/19-25 where he was dx with sepsis from a LLL with acute hypoxic respiratory failure.Blood cx grew out coag neg staph which ID felt did not represent a true bacteremia. Pt was treated with 7 days IV antibiotics and  stopped. No sign of endocarditis, no sign of progressive osteomyelitis from R foot. Pt is admittted back to SNF for residential care. While at SNF pt will be followed for DM2, tx with metformin and insulin, hx CVA, tx with plavix and GERd, tx with protonix.  Past Medical History:  Diagnosis Date  . Allergy   . Anorexia   . CAD (coronary artery disease)   . Colon polyp   . Diabetes mellitus without complication ([place])   . Diabetic retinopathy ([place])   . GERD (gastroesophageal reflux disease)   . Hyperlipidemia   . Hypertension   . Left hemiparesis ([place])   . PVD (peripheral vascular disease) ([place])   . Stroke [facility])     Past Surgical History:  Procedure Laterality Date  . enteroscopic laser photocoagulation Left 2001  . left bka    . TEE WITHOUT CARDIOVERSION N/A 06/16/2016   Procedure: TRANSESOPHAGEAL ECHOCARDIOGRAM (TEE);  Surgeon: Pixie Casino, MD;  Location: [facility] ENDOSCOPY;  Service: Cardiovascular;  Laterality: N/A;      Medication List       Accurate as of 06/19/16 10:46 AM. Always use your most recent med list.          buPROPion 150 MG 12 hr tablet Commonly known as:  WELLBUTRIN SR Take 150 mg by mouth daily. For depression   clopidogrel 75 MG tablet Commonly known as:  PLAVIX Take 75 mg by mouth daily.   docusate sodium 100 MG capsule Commonly known as:  COLACE Take 200 mg by mouth 3 (three) times daily. 10am, 4pm, 10pm   enalapril 5 MG tablet Commonly known as:  VASOTEC Take 5 mg by mouth daily.   feeding supplement (GLUCERNA SHAKE) Liqd Take 237 mLs by mouth See admin instructions. Offer 1 can (237 mls) daily between breakfast and lunch and 1 can 3 times daily at 10am, 2pm, 6pm   feeding supplement (PRO-STAT SUGAR FREE 64) Liqd Take 30 mLs by mouth See admin instructions. Give 30 ml by mouth twice daily - at 1pm and 5pm   ipratropium-albuterol 0.5-2.5 (3) MG/3ML Soln Commonly known as:  DUONEB Take 3 mLs by nebulization every 6 (six) hours as needed (shortness of breath/ congestion/ wheezing).   meclizine 25 MG tablet Commonly known as:  ANTIVERT Take 25 mg by mouth 3 (  three) times daily. For dizziness - 10am, 4pm, 10pm   metFORMIN 1000 MG tablet Commonly known as:  GLUCOPHAGE Take 1,000 mg by mouth 2 (two) times daily with a meal. 10am, 7pm   mirtazapine 15 MG tablet Commonly known as:  REMERON Take 7.5 mg by mouth at bedtime.   olopatadine 0.1 % ophthalmic solution Commonly known as:  PATANOL Place 1 drop into both eyes daily.   ondansetron 4 MG tablet Commonly known as:  ZOFRAN Take 4 mg by mouth every 6 (six) hours as needed for nausea or vomiting.   pantoprazole 40 MG tablet Commonly known as:  PROTONIX Take 40 mg by mouth daily at 6 (six) AM. For reflux   sertraline 100 MG tablet Commonly known as:   ZOLOFT Take 100 mg by mouth daily.   tamsulosin 0.4 MG Caps capsule Commonly known as:  FLOMAX Take 0.4 mg by mouth daily.   TOUJEO SOLOSTAR 300 UNIT/ML Sopn Generic drug:  Insulin Glargine Inject 10 Units into the skin at bedtime.       No orders of the defined types were placed in this encounter.   Immunization History  Administered Date(s) Administered  . PPD Test 05/12/2009    Social History  Substance Use Topics  . Smoking status: Never Smoker  . Smokeless tobacco: Never Used  . Alcohol use No    Family history is   Family History  Problem Relation Age of Onset  . Diabetes Mother       Review of Systems  DATA OBTAINED: from patient GENERAL:  no fevers, fatigue, appetite changes SKIN: No itching, or rash EYES: No eye pain, redness, discharge EARS: No earache, tinnitus, change in hearing NOSE: No congestion, drainage or bleeding  MOUTH/THROAT: No mouth or tooth pain, No sore throat RESPIRATORY: No cough, wheezing, SOB CARDIAC: No chest pain, palpitations, lower extremity edema  GI: No abdominal pain, No N/V/D or constipation, No heartburn or reflux  GU: No dysuria, frequency or urgency, or incontinence  MUSCULOSKELETAL: No unrelieved bone/joint pain NEUROLOGIC: No headache, dizziness or focal weakness PSYCHIATRIC: No c/o anxiety or sadness   Vitals:   06/19/16 0949  BP: 110/62  Pulse: 88  Resp: 12  Temp: 97.5 F (36.4 C)    SpO2 Readings from Last 1 Encounters:  06/19/16 94%   Body mass index is 14.61 kg/m.     Physical Exam  GENERAL APPEARANCE: Alert, mod conversant,  No acute distress.  SKIN: No diaphoresis rash HEAD: Normocephalic, atraumatic  EYES: Conjunctiva/lids clear. Pupils round, reactive. EOMs intact.  EARS: External exam WNL, canals clear. Hearing grossly normal.  NOSE: No deformity or discharge.  MOUTH/THROAT: white plaque  Noted on tongue RESPIRATORY: Breathing is even, unlabored. Lung sounds are clear   CARDIOVASCULAR:  Heart RRR no murmurs, rubs or gallops. No peripheral edema.   GASTROINTESTINAL: Abdomen is soft, non-tender, not distended w/ normal bowel sounds. GENITOURINARY: Bladder non tender, not distended  MUSCULOSKELETAL: extremely thin NEUROLOGIC:  Cranial nerves 2-12 grossly intact. Moves all extremities  PSYCHIATRIC: Mood and affect appropriate to situation, no behavioral issues  Patient Active Problem List   Diagnosis Date Noted  . Coagulase negative Staphylococcus bacteremia   . Cachexia ([place])   . Pressure injury of skin 06/12/2016  . Sepsis ([place]) 06/11/2016  . Anemia of chronic disease 06/04/2016  . Severe sepsis ([place]) 05/01/2016  . AKI (acute kidney injury) ([place]) 05/01/2016  . CAD (coronary artery disease) 05/01/2016  . Osteomyelitis of ankle and foot ([place]) 03/25/2016  .  Leukocytosis 01/02/2016  . Anxiety state 07/02/2015  . Prostate cancer ([place]) 10/30/2014  . Fever presenting with conditions classified elsewhere 10/04/2014  . Acute urinary retention 09/16/2014  . Taking medication for chronic disease 09/07/2014  . Leukocytosis, unspecified 03/03/2014  . Peripheral vascular disease ([place]) 02/25/2014  . Conjunctivitis 02/25/2014  . Itching 10/23/2013  . Depression 04/17/2013  . Pruritic disorder 03/11/2013  . Urinary tract infection 02/27/2013  . Pure hypercholesterolemia 11/29/2012  . 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) 11/29/2012  . Hyperlipidemia LDL goal <70 11/11/2012  . Loss of weight 11/11/2012  . Essential hypertension, benign 11/11/2012  . Unspecified constipation 11/11/2012  . Type 2 diabetes with complication ([place]) 99991111  . Benign paroxysmal positional vertigo 11/11/2012  . CVA (cerebral vascular accident) ([place]) 11/11/2012  . Insomnia 11/11/2012  . GERD (gastroesophageal reflux disease) 11/11/2012      Labs reviewed: Basic Metabolic  Panel:    Component Value Date/Time   NA 140 06/16/2016 0609   NA 140 06/16/2016   K 3.6 06/16/2016 0609   CL 105 06/16/2016 0609   CO2 27 06/16/2016 0609   GLUCOSE 141 (H) 06/16/2016 0609   BUN 18 06/16/2016 0609   BUN 18 06/16/2016   CREATININE 0.68 06/16/2016 0609   CALCIUM 8.7 (L) 06/16/2016 0609   PROT 7.0 06/11/2016 1835   ALBUMIN 3.1 (L) 06/11/2016 1835   AST 18 06/11/2016 1835   ALT 10 (L) 06/11/2016 1835   ALKPHOS 97 06/11/2016 1835   BILITOT 0.7 06/11/2016 1835   GFRNONAA >60 06/16/2016 0609   GFRAA >60 06/16/2016 0609     Recent Labs  06/13/16 0023 06/14/16 06/14/16 1422 06/16/16 06/16/16 0609  NA 133* 137 137 140 140  K 3.8 3.7 3.7  --  3.6  CL 101  --  103  --  105  CO2 23  --  26  --  27  GLUCOSE 205*  --  187*  --  141*  BUN 28* 20 20 18 18   CREATININE 0.87 0.6 0.59* 0.7 0.68  CALCIUM 8.5*  --  8.3*  --  8.7*   Liver Function Tests:  Recent Labs  05/22/16 06/11/16 1243 06/11/16 1835  AST 13* 18 18  ALT 6* 9* 10*  ALKPHOS 84 97 97  BILITOT  --  0.8 0.7  PROT  --  6.7 7.0  ALBUMIN  --  3.1* 3.1*   No results for input(s): LIPASE, AMYLASE in the last 8760 hours. No results for input(s): AMMONIA in the last 8760 hours. CBC:  Recent Labs  06/11/16 1243 06/11/16 1835  06/13/16 0023 06/14/16 06/14/16 1422 06/16/16 06/16/16 0609  WBC 22.9* 19.7  19.7*  < > 14.1* 6.3 6.3 7.3 7.3  NEUTROABS 19.3* 17.9*  --   --   --   --   --   --   HGB 11.3* 11.4*  11.4*  < > 9.5* 8.6* 8.6*  --  8.5*  HCT 35.6* 35.2*  35*  < > 29.5* 27* 26.8*  --  26.9*  MCV 84.2 82.4  --  81.7  --  83.2  --  83.3  PLT 292 241  241  < > 189 142* 142*  --  182  < > = values in this interval not displayed. Lipid  Recent Labs  09/29/15 12/30/15  CHOL 163 138  HDL 35 47  LDLCALC 104 76  TRIG 122 77  Cardiac Enzymes:  Recent Labs  06/11/16 1243 06/11/16 1835  TROPONINI 0.10* <0.03   BNP:  Recent Labs  06/11/16 1243  BNP 221.8*   No results found for:  [facility] Lab Results  Component Value Date   HGBA1C 5.3 12/30/2015   Lab Results  Component Value Date   TSH 1.67 12/30/2015   No results found for: VITAMINB12 No results found for: FOLATE No results found for: IRON, TIBC, FERRITIN  Imaging and Procedures obtained prior to SNF admission: Dg Abdomen 1 View  Result Date: 06/11/2016 CLINICAL DATA:  70 year old male left-side-down lateral decubitus view for possible free air. Initial encounter. EXAM: ABDOMEN - 1 VIEW COMPARISON:  CT Abdomen and Pelvis 04/23/2016. FINDINGS: No pneumoperitoneum. Visualized bowel gas pattern is non obstructed. Negative visible right lung base. No acute osseous abnormality identified. IMPRESSION: Negative for free air.  Bowel gas pattern appears normal. Electronically Signed   By: Genevie Ann M.D.   On: 06/11/2016 16:32   Dg Chest Port 1 View  Result Date: 06/12/2016 CLINICAL DATA:  Sepsis, history of coronary artery disease, diabetes, previous CVA EXAM: PORTABLE CHEST 1 VIEW COMPARISON:  Portable chest x-ray of June 11, 2016 and April 24, 2016. FINDINGS: The lungs are less well inflated today. There is patchy density at the left lung base obscuring the hemidiaphragm. The interstitial markings in the right lung are mildly increased though stable. The heart and pulmonary vascularity are normal. The mediastinum is normal in width. The observed bony thorax is unremarkable. IMPRESSION: Left lower lobe atelectasis or pneumonia more conspicuous today. Mild stable interstitial prominence on the right may reflect interstitial edema or subsegmental atelectasis. Electronically Signed   By: David  Martinique M.D.   On: 06/12/2016 07:49   Dg Chest Portable 1 View  Result Date: 06/11/2016 CLINICAL DATA:  Reason for exam: respiratory distress. Medical hx: diabetes, hypertension. Pt is paralyzed on left side; pt has ETT. EXAM: PORTABLE CHEST 1 VIEW COMPARISON:  04/24/2016 FINDINGS: Position degraded exam, with to patient rotated  to the right and chin overlying the apex of the right upper lobe. Normal heart size. Moderate left hemidiaphragm elevation. No pleural effusion or pneumothorax. Improved left base aeration with mild volume loss remaining. No well-defined consolidation identified. Interval removal of right PICC line. IMPRESSION: Left hemidiaphragm elevation with improved left base aeration. No acute findings. Decreased sensitivity and specificity exam due to technique related factors, as described above. No endotracheal tube identified.  Correlate with clinical history. Electronically Signed   By: Abigail Miyamoto M.D.   On: 06/11/2016 12:49     Not all labs, radiology exams or other studies done during hospitalization come through on my EPIC note; however they are reviewed by me.    Assessment and Plan  SEPSIS/ LLL PNA/ ACUTE HYPOXIC RESPIRATORY FAILURE -Upon admission, patient was noted to be tachycardic, tachypneic, with leukocytosis and hypotension. lacticacid was up to 3.4; O2 sats in the 70's - Left lower lobe atelectasis or pneumonia on CXRm finished 7 days of antibiotics while hospitalized. ID followed. - Influenza PCR negative - Blood cultures 06/11/2016: 1/2 Coag negative staph. Staph aureus negative. Repeat cultures on 11/21 with coag negative staph, ID consulted, felt like patient did not have true bacteremia and recommended discontinuation of IV antibiotics as patient has received 7 days.  - TEE done negative for endocarditis. Appreciate cardiology. CT scan of the right foot without any evidence of progressive osteomyelitis. No further antibiotics needed per ID SNF - admitted for residential care; pt is now  on RA  DM2 SNF - cont metformin 1000 mg BID and toujeo 10 u qHS  HX CVA WITH L SIDE WEAKNESS SNF - cont plavix 75 mg daily  DEPRESSION SNF - pt is more resigned than anything;cont zoloft 100 mg daily and wellbutrin 150 mg daily  GERD SNF - cont protonix 40 mg daily  ANEMIA - d/c Hb 8.5 SNF -  will monitor at intervals  THROMBOCYTOPENIA - PLT 140 in hosp SNF -  d/c PLT  180; will monitor at intervals  [facility] SNF - noted during admit to SNF PE ; diflucan for 15 days   Time spent > 45 min;> 50% of time with patient was spent reviewing records, labs, tests and studies, counseling and developing plan of care  Webb Silversmith D. Sheppard Coil, MD

## 2016-06-24 DIAGNOSIS — J9601 Acute respiratory failure with hypoxia: Secondary | ICD-10-CM | POA: Insufficient documentation

## 2016-06-24 DIAGNOSIS — J181 Lobar pneumonia, unspecified organism: Secondary | ICD-10-CM

## 2016-06-24 DIAGNOSIS — J189 Pneumonia, unspecified organism: Secondary | ICD-10-CM | POA: Insufficient documentation

## 2016-06-24 DIAGNOSIS — D696 Thrombocytopenia, unspecified: Secondary | ICD-10-CM | POA: Insufficient documentation

## 2016-07-07 ENCOUNTER — Non-Acute Institutional Stay (SKILLED_NURSING_FACILITY): Payer: Medicare Other | Admitting: Internal Medicine

## 2016-07-07 ENCOUNTER — Encounter: Payer: Self-pay | Admitting: Internal Medicine

## 2016-07-07 DIAGNOSIS — L89891 Pressure ulcer of other site, stage 1: Secondary | ICD-10-CM | POA: Diagnosis not present

## 2016-07-07 NOTE — Progress Notes (Signed)
Location:  [facility] Room Number: [phone] Place of Service:  SNF [phone])  Jeremiah Baldwin. Jeremiah Coil, MD  No care team member to display  Extended Emergency Contact Information Primary Emergency Contact: Pope,Carolyn Address: [address]          [place] [address] [place] Phone: [phone] Mobile Phone: [phone] Relation: None    Allergies: Codeine  Chief Complaint  Patient presents with  . Acute Visit    Acute    HPI: Patient is 70 y.o. male who the wound nurse has asked me to see. Pt has a new lesion on his penis from his foley catheter.  Past Medical History:  Diagnosis Date  . Allergy   . Anorexia   . CAD (coronary artery disease)   . Colon polyp   . Diabetes mellitus without complication ([place])   . Diabetic retinopathy ([place])   . GERD (gastroesophageal reflux disease)   . Hyperlipidemia   . Hypertension   . Left hemiparesis ([place])   . PVD (peripheral vascular disease) ([place])   . Stroke [facility])     Past Surgical History:  Procedure Laterality Date  . enteroscopic laser photocoagulation Left 2001  . left bka    . TEE WITHOUT CARDIOVERSION N/A 06/16/2016   Procedure: TRANSESOPHAGEAL ECHOCARDIOGRAM (TEE);  Surgeon: Pixie Casino, MD;  Location: [facility] ENDOSCOPY;  Service: Cardiovascular;  Laterality: N/A;    Allergies as of 07/07/2016      Reactions   Codeine Other (See Comments)   Listed on [facility] 06/11/16- unknown reaction      Medication List       Accurate as of 07/07/16 12:46 PM. Always use your most recent med list.          buPROPion 150 MG 12 hr tablet Commonly known as:  WELLBUTRIN SR Take 150 mg by mouth daily. For depression   clopidogrel 75 MG tablet Commonly known as:  PLAVIX Take 75 mg by mouth daily.   docusate sodium 100 MG capsule Commonly known as:  COLACE Take 200 mg by mouth 3 (three) times daily. 10am, 4pm, 10pm   enalapril 5 MG tablet Commonly known as:  VASOTEC Take  5 mg by mouth daily.   feeding supplement (GLUCERNA SHAKE) Liqd Take 237 mLs by mouth See admin instructions. Offer 1 can (237 mls) daily between breakfast and lunch and 1 can 3 times daily at 10am, 2pm, 6pm   feeding supplement (PRO-STAT SUGAR FREE 64) Liqd Take 30 mLs by mouth See admin instructions. Give 30 ml by mouth twice daily - at 1pm and 5pm   ipratropium-albuterol 0.5-2.5 (3) MG/3ML Soln Commonly known as:  DUONEB Take 3 mLs by nebulization every 6 (six) hours as needed (shortness of breath/ congestion/ wheezing).   meclizine 25 MG tablet Commonly known as:  ANTIVERT Take 25 mg by mouth 3 (three) times daily. For dizziness - 10am, 4pm, 10pm   metFORMIN 1000 MG tablet Commonly known as:  GLUCOPHAGE Take 1,000 mg by mouth 2 (two) times daily with a meal. 10am, 7pm   mirtazapine 15 MG tablet Commonly known as:  REMERON Take 7.5 mg by mouth at bedtime.   olopatadine 0.1 % ophthalmic solution Commonly known as:  PATANOL Place 1 drop into both eyes daily.   ondansetron 4 MG tablet Commonly known as:  ZOFRAN Take 4 mg by mouth every 6 (six) hours as needed for nausea or vomiting.   pantoprazole 40 MG tablet Commonly known  as:  PROTONIX Take 40 mg by mouth daily at 6 (six) AM. For reflux   sertraline 100 MG tablet Commonly known as:  ZOLOFT Take 100 mg by mouth daily.   tamsulosin 0.4 MG Caps capsule Commonly known as:  FLOMAX Take 0.4 mg by mouth daily.   TOUJEO SOLOSTAR 300 UNIT/ML Sopn Generic drug:  Insulin Glargine Inject 10 Units into the skin at bedtime.       No orders of the defined types were placed in this encounter.   Immunization History  Administered Date(s) Administered  . PPD Test 05/12/2009    Social History  Substance Use Topics  . Smoking status: Never Smoker  . Smokeless tobacco: Never Used  . Alcohol use No    Review of Systems  DATA OBTAINED: from nurse GENERAL:  no fevers, fatigue, appetite changes SKIN: No itching,  rash HEENT: No complaint RESPIRATORY: No cough, wheezing, SOB CARDIAC: No chest pain, palpitations, lower extremity edema  GI: No abdominal pain, No N/V/D or constipation, No heartburn or reflux  GU: No dysuria, frequency or urgency, or incontinence  MUSCULOSKELETAL: No unrelieved bone/joint pain NEUROLOGIC: No headache, dizziness  PSYCHIATRIC: No overt anxiety or sadness  Vitals:   07/07/16 1237  BP: 106/71  Pulse: 69  Resp: 16  Temp: 97.9 F (36.6 C)   Body mass index is 14.66 kg/m. Physical Exam  GENERAL APPEARANCE: Alert, conversant, No acute distress  SKIN: No diaphoresis rash HEENT: Unremarkable RESPIRATORY: Breathing is even, unlabored. Lung sounds are clear   CARDIOVASCULAR: Heart RRR no murmurs, rubs or gallops. No peripheral edema  GASTROINTESTINAL: Abdomen is soft, non-tender, not distended w/ normal bowel sounds.  GENITOURINARY: Bladder non tender, not distended ; penile implant ; indwelling foley has started to create a pressure sore in the glans with minimal depth MUSCULOSKELETAL: No abnormal joints or musculature NEUROLOGIC: Cranial nerves 2-12 grossly intact. Moves all extremities PSYCHIATRIC: Mood and affect appropriate to situation, no behavioral issues  Patient Active Problem List   Diagnosis Date Noted  . Left lower lobe pneumonia ([place]) 06/24/2016  . Acute respiratory failure with hypoxia ([place]) 06/24/2016  . Thrombocytopenia ([place]) 06/24/2016  . Coagulase negative Staphylococcus bacteremia   . Cachexia ([place])   . Pressure injury of skin 06/12/2016  . Sepsis ([place]) 06/11/2016  . Anemia of chronic disease 06/04/2016  . Severe sepsis ([place]) 05/01/2016  . AKI (acute kidney injury) ([place]) 05/01/2016  . CAD (coronary artery disease) 05/01/2016  . Osteomyelitis of ankle and foot ([place]) 03/25/2016  . Acute osteomyelitis of toe of right foot ([place]) 03/25/2016  . Leukocytosis 01/02/2016  . Anxiety state 07/02/2015  . Prostate cancer ([place]) 10/30/2014  . Fever  presenting with conditions classified elsewhere 10/04/2014  . Acute urinary retention 09/16/2014  . Taking medication for chronic disease 09/07/2014  . Leukocytosis, unspecified 03/03/2014  . Peripheral vascular disease ([place]) 02/25/2014  . Conjunctivitis 02/25/2014  . Itching 10/23/2013  . Depression 04/17/2013  . Pruritic disorder 03/11/2013  . Urinary tract infection 02/27/2013  . Pure hypercholesterolemia 11/29/2012  . 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 Hyperlipidemia LDL goal <70 11/11/2012  . Loss of weight 11/11/2012  . Essential hypertension, benign 11/11/2012  . Unspecified constipation 11/11/2012  . Type 2 diabetes with complication ([place]) [id number]  . Benign paroxysmal positional vertigo 11/11/2012  . CVA (cerebral vascular accident) ([place]) 11/11/2012  . Insomnia 11/11/2012  . GERD (gastroesophageal reflux  disease) 11/11/2012    CMP     Component Value Date/Time   NA 140 06/16/2016 0609   NA 140 06/16/2016   K 3.6 06/16/2016 0609   CL 105 06/16/2016 0609   CO2 27 06/16/2016 0609   GLUCOSE 141 (H) 06/16/2016 0609   BUN 18 06/16/2016 0609   BUN 18 06/16/2016   CREATININE 0.68 06/16/2016 0609   CALCIUM 8.7 (L) 06/16/2016 0609   PROT 7.0 06/11/2016 1835   ALBUMIN 3.1 (L) 06/11/2016 1835   AST 18 06/11/2016 1835   ALT 10 (L) 06/11/2016 1835   ALKPHOS 97 06/11/2016 1835   BILITOT 0.7 06/11/2016 1835   GFRNONAA >60 06/16/2016 0609   GFRAA >60 06/16/2016 0609    Recent Labs  06/13/16 0023 06/14/16 06/14/16 1422 06/16/16 06/16/16 0609  NA 133* 137 137 140 140  K 3.8 3.7 3.7  --  3.6  CL 101  --  103  --  105  CO2 23  --  26  --  27  GLUCOSE 205*  --  187*  --  141*  BUN 28* 20 20 18 18   CREATININE 0.87 0.6 0.59* 0.7 0.68  CALCIUM 8.5*  --  8.3*  --  8.7*    Recent Labs  05/22/16 06/11/16 1243 06/11/16 1835  AST  13* 18 18  ALT 6* 9* 10*  ALKPHOS 84 97 97  BILITOT  --  0.8 0.7  PROT  --  6.7 7.0  ALBUMIN  --  3.1* 3.1*    Recent Labs  06/11/16 1243 06/11/16 1835  06/13/16 0023 06/14/16 06/14/16 1422 06/16/16 06/16/16 0609  WBC 22.9* 19.7  19.7*  < > 14.1* 6.3 6.3 7.3 7.3  NEUTROABS 19.3* 17.9*  --   --   --   --   --   --   HGB 11.3* 11.4*  11.4*  < > 9.5* 8.6* 8.6*  --  8.5*  HCT 35.6* 35.2*  35*  < > 29.5* 27* 26.8*  --  26.9*  MCV 84.2 82.4  --  81.7  --  83.2  --  83.3  PLT 292 241  241  < > 189 142* 142*  --  182  < > = values in this interval not displayed.  Recent Labs  09/29/15 12/30/15  CHOL 163 138  LDLCALC 104 76  TRIG 122 77   No results found for: [facility] Lab Results  Component Value Date   TSH 1.67 12/30/2015   Lab Results  Component Value Date   HGBA1C 5.3 12/30/2015   Lab Results  Component Value Date   CHOL 138 12/30/2015   HDL 47 12/30/2015   LDLCALC 76 12/30/2015   TRIG 77 12/30/2015    Significant Diagnostic Results in last 30 days:  Dg Abdomen 1 View  Result Date: 06/11/2016 CLINICAL DATA:  70 year old male left-side-down lateral decubitus view for possible free air. Initial encounter. EXAM: ABDOMEN - 1 VIEW COMPARISON:  CT Abdomen and Pelvis 04/23/2016. FINDINGS: No pneumoperitoneum. Visualized bowel gas pattern is non obstructed. Negative visible right lung base. No acute osseous abnormality identified. IMPRESSION: Negative for free air.  Bowel gas pattern appears normal. Electronically Signed   By: Genevie Ann M.D.   On: 06/11/2016 16:32   Ct Foot Right W Contrast  Result Date: 06/16/2016 CLINICAL DATA:  Right foot sores and erythema. Being treated for sepsis. Evaluate for osteomyelitis. History of diabetes, hypertension and left below the knee amputation. EXAM: CT OF THE RIGHT FOOT WITH CONTRAST  TECHNIQUE: Multidetector CT imaging was performed following the standard protocol during bolus administration of intravenous contrast. CONTRAST:  75mL  ISOVUE-300 IOPAMIDOL (ISOVUE-300) INJECTION 61% COMPARISON:  MRI 04/27/2016 FINDINGS: Bones/Joint/Cartilage The bones are severely demineralized. There is no evidence of acute fracture or dislocation. There is mild flattening and subchondral sclerosis within the head of the second metatarsal, most consistent with [place] infraction. There are apparent erosive changes at the third, fourth and fifth proximal interphalangeal joints, best seen on the sagittal images. These are grossly stable from the prior MRI and may be related to arthropathy or remote infection. No progressive bone destruction identified to suggest osteomyelitis. Mild degenerative changes are present at the first metatarsal phalangeal joint. No significant joint effusions are seen. Ligaments Not relevant for exam/indication. The Lisfranc ligament appears intact. Muscles and Tendons Mild muscular atrophy. No focal intramuscular fluid collection or tendon abnormality identified. Soft tissues Mild generalized subcutaneous edema. No focal soft tissue ulceration, fluid collection, emphysema or foreign body identified. There are diffuse vascular calcifications consistent with diabetes. IMPRESSION: 1. No evidence of progressive osteomyelitis. 2. The bones are diffusely demineralized. There are grossly stable erosive changes at the third, fourth and fifth proximal interphalangeal joints compared with previous MRI. 3. Mild generalized subcutaneous edema. No evidence of abscess or myositis. Electronically Signed   By: Richardean Sale M.D.   On: 06/16/2016 07:13   Dg Chest Port 1 View  Result Date: 06/14/2016 CLINICAL DATA:  New cough, possible aspiration EXAM: PORTABLE CHEST 1 VIEW COMPARISON:  06/12/2016 FINDINGS: Cardiomediastinal silhouette is stable. There is streaky right basilar atelectasis or infiltrate. Aspiration pneumonia cannot be excluded. No pulmonary edema. Again noted mottled appearance with some cortical sclerosis left humeral shaft.  Metastatic disease cannot be excluded. Clinical correlation is necessary there is probable old fracture deformity left humeral neck. IMPRESSION: There is streaky right basilar atelectasis or infiltrate. Aspiration pneumonia cannot be excluded. No pulmonary edema. Again noted mottled appearance with some cortical sclerosis left humeral shaft. Metastatic disease cannot be excluded. Clinical correlation is necessary there is probable old fracture deformity left humeral neck. Electronically Signed   By: Lahoma Crocker M.D.   On: 06/14/2016 12:33   Dg Chest Port 1 View  Result Date: 06/12/2016 CLINICAL DATA:  Sepsis, history of coronary artery disease, diabetes, previous CVA EXAM: PORTABLE CHEST 1 VIEW COMPARISON:  Portable chest x-ray of June 11, 2016 and April 24, 2016. FINDINGS: The lungs are less well inflated today. There is patchy density at the left lung base obscuring the hemidiaphragm. The interstitial markings in the right lung are mildly increased though stable. The heart and pulmonary vascularity are normal. The mediastinum is normal in width. The observed bony thorax is unremarkable. IMPRESSION: Left lower lobe atelectasis or pneumonia more conspicuous today. Mild stable interstitial prominence on the right may reflect interstitial edema or subsegmental atelectasis. Electronically Signed   By: David  Martinique M.D.   On: 06/12/2016 07:49   Dg Chest Portable 1 View  Result Date: 06/11/2016 CLINICAL DATA:  Reason for exam: respiratory distress. Medical hx: diabetes, hypertension. Pt is paralyzed on left side; pt has ETT. EXAM: PORTABLE CHEST 1 VIEW COMPARISON:  04/24/2016 FINDINGS: Position degraded exam, with to patient rotated to the right and chin overlying the apex of the right upper lobe. Normal heart size. Moderate left hemidiaphragm elevation. No pleural effusion or pneumothorax. Improved left base aeration with mild volume loss remaining. No well-defined consolidation identified. Interval  removal of right PICC line. IMPRESSION: Left hemidiaphragm elevation with improved  left base aeration. No acute findings. Decreased sensitivity and specificity exam due to technique related factors, as described above. No endotracheal tube identified.  Correlate with clinical history. Electronically Signed   By: Abigail Miyamoto M.D.   On: 06/11/2016 12:49   Dg Swallowing Func-speech Pathology  Result Date: 06/13/2016 Objective Swallowing Evaluation: Type of Study: MBS-Modified Barium Swallow Study Patient Details Name: Onterio Punzalan MRN: [id number] Date of Birth: [birth year] Today's Date: 06/13/2016 Time: SLP Start Time (ACUTE ONLY): 0930-SLP Stop Time (ACUTE ONLY): 0950 SLP Time Calculation (min) (ACUTE ONLY): 20 min Past Medical History: Past Medical History: Diagnosis Date . Allergy  . Anorexia  . CAD (coronary artery disease)  . Colon polyp  . Diabetes mellitus without complication ([place])  . Diabetic retinopathy ([place])  . GERD (gastroesophageal reflux disease)  . Hyperlipidemia  . Hypertension  . Left hemiparesis ([place])  . PVD (peripheral vascular disease) ([place])  . Stroke [facility])  Past Surgical History: Past Surgical History: Procedure Laterality Date . enteroscopic laser photocoagulation Left 2001 . left bka   HPI: 70 y.o. male from SNF admitted with PNA and Sepsis. Pt with hx of CVA with left hemiparesis, L BKA, CAD, Anorexia, DM, Retinopathy, HTN, and PVD.  Subjective: alert, poor spontaneity, limited speech today Assessment / Plan / Recommendation CHL IP CLINICAL IMPRESSIONS 06/13/2016 Therapy Diagnosis Moderate oral phase dysphagia Clinical Impression Pt presented with a primarily oral dysphagia with excessive oral holding of POs, requiring moderate cueing to initiate mastication/oral movement.  The swallow was delayed to the pyriform sinuses for puree and thin liquids, but once triggered, pharyngeal clearance was normal and there was adequate airway protection.  (No penetration nor aspiration noted, despite  consumption of large, successive thin liquid boluses.)  Pt's performance during MBS is improved since clinical assessment yesterday.  Recommend advancing liquids back to thins, maintain dysphagia 3 consistency.  SLP will follow to ensure functional toleration of this diet.  Impact on safety and function Moderate aspiration risk   CHL IP TREATMENT RECOMMENDATION 06/12/2016 Treatment Recommendations Therapy as outlined in treatment plan below   Prognosis 06/13/2016 Prognosis for Safe Diet Advancement Fair Barriers to Reach Goals Cognitive deficits Barriers/Prognosis Comment -- CHL IP DIET RECOMMENDATION 06/13/2016 SLP Diet Recommendations Dysphagia 3 (Mech soft) solids;Thin liquid Liquid Administration via Cup;Straw Medication Administration Whole meds with puree Compensations Slow rate;Small sips/bites;Monitor for anterior loss Postural Changes --   CHL IP OTHER RECOMMENDATIONS 06/13/2016 Recommended Consults -- Oral Care Recommendations Oral care BID Other Recommendations --   CHL IP FOLLOW UP RECOMMENDATIONS 06/13/2016 Follow up Recommendations Skilled Nursing facility   [facility] IP FREQUENCY AND DURATION 06/13/2016 Speech Therapy Frequency (ACUTE ONLY) min 3x week Treatment Duration 1 week      CHL IP ORAL PHASE 06/13/2016 Oral Phase Impaired Oral - Pudding Teaspoon -- Oral - Pudding Cup -- Oral - Honey Teaspoon -- Oral - Honey Cup -- Oral - Nectar Teaspoon -- Oral - Nectar Cup -- Oral - Nectar Straw -- Oral - Thin Teaspoon -- Oral - Thin Cup -- Oral - Thin Straw Right anterior bolus loss;Weak lingual manipulation;Holding of bolus;Delayed oral transit;Decreased bolus cohesion Oral - Puree Weak lingual manipulation;Holding of bolus;Delayed oral transit;Decreased bolus cohesion Oral - Mech Soft -- Oral - Regular -- Oral - Multi-Consistency -- Oral - Pill -- Oral Phase - Comment --  CHL IP PHARYNGEAL PHASE 06/13/2016 Pharyngeal Phase Impaired Pharyngeal- Pudding Teaspoon -- Pharyngeal -- Pharyngeal- Pudding Cup --  Pharyngeal -- Pharyngeal- Honey Teaspoon -- Pharyngeal -- Pharyngeal- Honey Cup --  Pharyngeal -- Pharyngeal- Nectar Teaspoon -- Pharyngeal -- Pharyngeal- Nectar Cup -- Pharyngeal -- Pharyngeal- Nectar Straw -- Pharyngeal -- Pharyngeal- Thin Teaspoon -- Pharyngeal -- Pharyngeal- Thin Cup -- Pharyngeal -- Pharyngeal- Thin Straw Delayed swallow initiation-pyriform sinuses Pharyngeal -- Pharyngeal- Puree Delayed swallow initiation-pyriform sinuses Pharyngeal -- Pharyngeal- Mechanical Soft -- Pharyngeal -- Pharyngeal- Regular -- Pharyngeal -- Pharyngeal- Multi-consistency -- Pharyngeal -- Pharyngeal- Pill -- Pharyngeal -- Pharyngeal Comment --  No flowsheet data found. No flowsheet data found. [name] 06/13/2016, 10:08 AM               Assessment and Plan  PRESSURE ULCER PENIS - wound care to follow   Jeremiah Baldwin. Jeremiah Coil, MD

## 2016-07-19 ENCOUNTER — Non-Acute Institutional Stay (SKILLED_NURSING_FACILITY): Payer: Medicare Other | Admitting: Internal Medicine

## 2016-07-19 ENCOUNTER — Encounter: Payer: Self-pay | Admitting: Internal Medicine

## 2016-07-19 DIAGNOSIS — L89899 Pressure ulcer of other site, unspecified stage: Secondary | ICD-10-CM | POA: Diagnosis not present

## 2016-07-19 NOTE — Progress Notes (Signed)
Location:  [place] Room Number: [phone] Place of Service:  SNF [phone])  Noah Delaine. Sheppard Coil, MD  No care team member to display  Extended Emergency Contact Information Primary Emergency Contact: Upperman,Carolyn Address: [address]          [place] [address] [place] Phone: [phone] Mobile Phone: [phone] Relation: None    Allergies: Codeine  Chief Complaint  Patient presents with  . Acute Visit    Acute    HPI: Patient is 70 y.o. male who Kasie, wound nurse asked me to see because the pressure wound in pt penis is getting larger. No infection, no fever.  Past Medical History:  Diagnosis Date  . Allergy   . Anorexia   . CAD (coronary artery disease)   . Colon polyp   . Diabetes mellitus without complication ([place])   . Diabetic retinopathy ([place])   . GERD (gastroesophageal reflux disease)   . Hyperlipidemia   . Hypertension   . Left hemiparesis ([place])   . PVD (peripheral vascular disease) ([place])   . Stroke [facility])     Past Surgical History:  Procedure Laterality Date  . enteroscopic laser photocoagulation Left 2001  . left bka    . TEE WITHOUT CARDIOVERSION N/A 06/16/2016   Procedure: TRANSESOPHAGEAL ECHOCARDIOGRAM (TEE);  Surgeon: Pixie Casino, MD;  Location: [facility] ENDOSCOPY;  Service: Cardiovascular;  Laterality: N/A;    Allergies as of 07/19/2016      Reactions   Codeine Other (See Comments)   Listed on [facility] 06/11/16- unknown reaction      Medication List       Accurate as of 07/19/16  3:39 PM. Always use your most recent med list.          buPROPion 150 MG 12 hr tablet Commonly known as:  WELLBUTRIN SR Take 150 mg by mouth daily. For depression   clopidogrel 75 MG tablet Commonly known as:  PLAVIX Take 75 mg by mouth daily.   docusate sodium 100 MG capsule Commonly known as:  COLACE Take 200 mg by mouth 3 (three) times daily. 10am, 4pm, 10pm   enalapril 5 MG tablet Commonly  known as:  VASOTEC Take 5 mg by mouth daily.   feeding supplement (GLUCERNA SHAKE) Liqd Take 237 mLs by mouth See admin instructions. Offer 1 can (237 mls) daily between breakfast and lunch and 1 can 3 times daily at 10am, 2pm, 6pm   feeding supplement (PRO-STAT SUGAR FREE 64) Liqd Take 30 mLs by mouth See admin instructions. Give 30 ml by mouth twice daily - at 1pm and 5pm   ipratropium-albuterol 0.5-2.5 (3) MG/3ML Soln Commonly known as:  DUONEB Take 3 mLs by nebulization every 6 (six) hours as needed (shortness of breath/ congestion/ wheezing).   meclizine 25 MG tablet Commonly known as:  ANTIVERT Take 25 mg by mouth 3 (three) times daily. For dizziness - 10am, 4pm, 10pm   metFORMIN 1000 MG tablet Commonly known as:  GLUCOPHAGE Take 1,000 mg by mouth 2 (two) times daily with a meal. 10am, 7pm   mirtazapine 15 MG tablet Commonly known as:  REMERON Take 7.5 mg by mouth at bedtime.   olopatadine 0.1 % ophthalmic solution Commonly known as:  PATANOL Place 1 drop into both eyes daily.   ondansetron 4 MG tablet Commonly known as:  ZOFRAN Take 4 mg by mouth every 6 (six) hours as needed for nausea or vomiting.   pantoprazole 40 MG tablet  Commonly known as:  PROTONIX Take 40 mg by mouth daily at 6 (six) AM. For reflux   sertraline 100 MG tablet Commonly known as:  ZOLOFT Take 100 mg by mouth daily.   tamsulosin 0.4 MG Caps capsule Commonly known as:  FLOMAX Take 0.4 mg by mouth daily.   TOUJEO SOLOSTAR 300 UNIT/ML Sopn Generic drug:  Insulin Glargine Inject 10 Units into the skin at bedtime.       No orders of the defined types were placed in this encounter.   Immunization History  Administered Date(s) Administered  . PPD Test 05/12/2009    Social History  Substance Use Topics  . Smoking status: Never Smoker  . Smokeless tobacco: Never Used  . Alcohol use No    Review of Systems  DATA OBTAINED: from patient- pt never has any c/o GENERAL:  no fevers,  fatigue, appetite changes SKIN: No itching, rash HEENT: No complaint RESPIRATORY: No cough, wheezing, SOB CARDIAC: No chest pain, palpitations, lower extremity edema  GI: No abdominal pain, No N/V/D or constipation, No heartburn or reflux  GU: No dysuria, frequency or urgency, or incontinence ;penis - ventral ulcer from foley about 1 cm in depth, giving penis a hypospadius look MUSCULOSKELETAL: No unrelieved bone/joint pain NEUROLOGIC: No headache, dizziness  PSYCHIATRIC: No overt anxiety or sadness  Vitals:   07/19/16 1510  BP: (!) 80/59  Pulse: 99  Resp: 16  Temp: 99.2 F (37.3 C)   Body mass index is 14.51 kg/m. Physical Exam  GENERAL APPEARANCE: Alert, conversant, No acute distress  SKIN: No diaphoresis rash HEENT: Unremarkable RESPIRATORY: Breathing is even, unlabored. Lung sounds are clear   CARDIOVASCULAR: Heart RRR no murmurs, rubs or gallops. No peripheral edema  GASTROINTESTINAL: Abdomen is soft, non-tender, not distended w/ normal bowel sounds.  GENITOURINARY: Bladder non tender, not distended ;penis - ventral ulcer from foley about 1 cm in depth, giving penis a hypospadius look MUSCULOSKELETAL: muscle wasting, very thin NEUROLOGIC: Cranial nerves 2-12 grossly intact. Moves all extremities PSYCHIATRIC: Mood and affect appropriate to situation, no behavioral issues  Patient Active Problem List   Diagnosis Date Noted  . Left lower lobe pneumonia ([place]) 06/24/2016  . Acute respiratory failure with hypoxia ([place]) 06/24/2016  . Thrombocytopenia ([place]) 06/24/2016  . Coagulase negative Staphylococcus bacteremia   . Cachexia ([place])   . Pressure injury of skin 06/12/2016  . Sepsis ([place]) 06/11/2016  . Anemia of chronic disease 06/04/2016  . Severe sepsis ([place]) 05/01/2016  . AKI (acute kidney injury) ([place]) 05/01/2016  . CAD (coronary artery disease) 05/01/2016  . Osteomyelitis of ankle and foot ([place]) 03/25/2016  . Acute osteomyelitis of toe of right foot ([place]) 03/25/2016    . Leukocytosis 01/02/2016  . Anxiety state 07/02/2015  . Prostate cancer ([place]) 10/30/2014  . Fever presenting with conditions classified elsewhere 10/04/2014  . Acute urinary retention 09/16/2014  . Taking medication for chronic disease 09/07/2014  . Leukocytosis, unspecified 03/03/2014  . Peripheral vascular disease ([place]) 02/25/2014  . Conjunctivitis 02/25/2014  . Itching 10/23/2013  . Depression 04/17/2013  . Pruritic disorder 03/11/2013  . Urinary tract infection 02/27/2013  . Pure hypercholesterolemia 11/29/2012  . 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 Hyperlipidemia LDL goal <70 11/11/2012  . Loss of weight 11/11/2012  . Essential hypertension, benign 11/11/2012  . Unspecified constipation 11/11/2012  . Type 2 diabetes with complication ([place]) 99991111  . Benign paroxysmal  positional vertigo 11/11/2012  . CVA (cerebral vascular accident) ([place]) 11/11/2012  . Insomnia 11/11/2012  . GERD (gastroesophageal reflux disease) 11/11/2012    CMP     Component Value Date/Time   NA 140 06/16/2016 0609   NA 140 06/16/2016   K 3.6 06/16/2016 0609   CL 105 06/16/2016 0609   CO2 27 06/16/2016 0609   GLUCOSE 141 (H) 06/16/2016 0609   BUN 18 06/16/2016 0609   BUN 18 06/16/2016   CREATININE 0.68 06/16/2016 0609   CALCIUM 8.7 (L) 06/16/2016 0609   PROT 7.0 06/11/2016 1835   ALBUMIN 3.1 (L) 06/11/2016 1835   AST 18 06/11/2016 1835   ALT 10 (L) 06/11/2016 1835   ALKPHOS 97 06/11/2016 1835   BILITOT 0.7 06/11/2016 1835   GFRNONAA >60 06/16/2016 0609   GFRAA >60 06/16/2016 0609    Recent Labs  06/13/16 0023 06/14/16 06/14/16 1422 06/16/16 06/16/16 0609  NA 133* 137 137 140 140  K 3.8 3.7 3.7  --  3.6  CL 101  --  103  --  105  CO2 23  --  26  --  27  GLUCOSE 205*  --  187*  --  141*  BUN 28* 20 20 18 18   CREATININE 0.87 0.6 0.59* 0.7  0.68  CALCIUM 8.5*  --  8.3*  --  8.7*    Recent Labs  05/22/16 06/11/16 1243 06/11/16 1835  AST 13* 18 18  ALT 6* 9* 10*  ALKPHOS 84 97 97  BILITOT  --  0.8 0.7  PROT  --  6.7 7.0  ALBUMIN  --  3.1* 3.1*    Recent Labs  06/11/16 1243 06/11/16 1835  06/13/16 0023 06/14/16 06/14/16 1422 06/16/16 06/16/16 0609  WBC 22.9* 19.7  19.7*  < > 14.1* 6.3 6.3 7.3 7.3  NEUTROABS 19.3* 17.9*  --   --   --   --   --   --   HGB 11.3* 11.4*  11.4*  < > 9.5* 8.6* 8.6*  --  8.5*  HCT 35.6* 35.2*  35*  < > 29.5* 27* 26.8*  --  26.9*  MCV 84.2 82.4  --  81.7  --  83.2  --  83.3  PLT 292 241  241  < > 189 142* 142*  --  182  < > = values in this interval not displayed.  Recent Labs  09/29/15 12/30/15  CHOL 163 138  LDLCALC 104 76  TRIG 122 77   No results found for: [facility] Lab Results  Component Value Date   TSH 1.67 12/30/2015   Lab Results  Component Value Date   HGBA1C 5.3 12/30/2015   Lab Results  Component Value Date   CHOL 138 12/30/2015   HDL 47 12/30/2015   LDLCALC 76 12/30/2015   TRIG 77 12/30/2015    Significant Diagnostic Results in last 30 days:  No results found.  Assessment and Plan  PRESSURE ULCER PENIS - kasie and I discussed dressing the wound in such a manner as the foley doesn't touch the edges of the urethral opening at all; she thought she could work something out   Time spent > 25 min [place] Nish D. Sheppard Coil, MD

## 2016-07-21 ENCOUNTER — Non-Acute Institutional Stay (SKILLED_NURSING_FACILITY): Payer: Medicare Other | Admitting: Internal Medicine

## 2016-07-21 ENCOUNTER — Encounter: Payer: Self-pay | Admitting: Internal Medicine

## 2016-07-21 DIAGNOSIS — R627 Adult failure to thrive: Secondary | ICD-10-CM | POA: Diagnosis not present

## 2016-07-21 DIAGNOSIS — F329 Major depressive disorder, single episode, unspecified: Secondary | ICD-10-CM

## 2016-07-21 DIAGNOSIS — I2510 Atherosclerotic heart disease of native coronary artery without angina pectoris: Secondary | ICD-10-CM

## 2016-07-21 DIAGNOSIS — F32A Depression, unspecified: Secondary | ICD-10-CM

## 2016-07-21 NOTE — Progress Notes (Signed)
Location:  [place] Room Number: [phone] Place of Service:  SNF [phone])  Noah Delaine. Sheppard Coil, MD  No care team member to display  Extended Emergency Contact Information Primary Emergency Contact: Kevorkian,Carolyn Address: [address]          [place] [address] [place] Phone: [phone] Mobile Phone: [phone] Relation: None    Allergies: Codeine  Chief Complaint  Patient presents with  . Medical Management of Chronic Issues    Routine Visit    HPI: Patient is 70 y.o. male who is being seen for  Routine issues of FTT,CAD and depression.  Past Medical History:  Diagnosis Date  . Allergy   . Anorexia   . CAD (coronary artery disease)   . Colon polyp   . Diabetes mellitus without complication ([place])   . Diabetic retinopathy ([place])   . GERD (gastroesophageal reflux disease)   . Hyperlipidemia   . Hypertension   . Left hemiparesis ([place])   . PVD (peripheral vascular disease) ([place])   . Stroke [facility])     Past Surgical History:  Procedure Laterality Date  . enteroscopic laser photocoagulation Left 2001  . left bka    . TEE WITHOUT CARDIOVERSION N/A 06/16/2016   Procedure: TRANSESOPHAGEAL ECHOCARDIOGRAM (TEE);  Surgeon: Pixie Casino, MD;  Location: [facility] ENDOSCOPY;  Service: Cardiovascular;  Laterality: N/A;    Allergies as of 07/21/2016      Reactions   Codeine Other (See Comments)   Listed on [facility] 06/11/16- unknown reaction      Medication List       Accurate as of 07/21/16 11:59 PM. Always use your most recent med list.          buPROPion 150 MG 12 hr tablet Commonly known as:  WELLBUTRIN SR Take 150 mg by mouth daily. For depression   clopidogrel 75 MG tablet Commonly known as:  PLAVIX Take 75 mg by mouth daily.   docusate sodium 100 MG capsule Commonly known as:  COLACE Take 200 mg by mouth 3 (three) times daily. 10am, 4pm, 10pm   enalapril 5 MG tablet Commonly known as:  VASOTEC Take 5  mg by mouth daily.   feeding supplement (GLUCERNA SHAKE) Liqd Take 237 mLs by mouth See admin instructions. Offer 1 can (237 mls) daily between breakfast and lunch and 1 can 3 times daily at 10am, 2pm, 6pm   feeding supplement (PRO-STAT SUGAR FREE 64) Liqd Take 30 mLs by mouth See admin instructions. Give 30 ml by mouth twice daily - at 1pm and 5pm   ipratropium-albuterol 0.5-2.5 (3) MG/3ML Soln Commonly known as:  DUONEB Take 3 mLs by nebulization every 6 (six) hours as needed (shortness of breath/ congestion/ wheezing).   meclizine 25 MG tablet Commonly known as:  ANTIVERT Take 25 mg by mouth 3 (three) times daily. For dizziness - 10am, 4pm, 10pm   metFORMIN 1000 MG tablet Commonly known as:  GLUCOPHAGE Take 1,000 mg by mouth 2 (two) times daily with a meal. 10am, 7pm   mirtazapine 15 MG tablet Commonly known as:  REMERON Take 7.5 mg by mouth at bedtime.   olopatadine 0.1 % ophthalmic solution Commonly known as:  PATANOL Place 1 drop into both eyes daily.   ondansetron 4 MG tablet Commonly known as:  ZOFRAN Take 4 mg by mouth every 6 (six) hours as needed for nausea or vomiting.   pantoprazole 40 MG tablet Commonly known as:  PROTONIX Take 40  mg by mouth daily at 6 (six) AM. For reflux   sertraline 100 MG tablet Commonly known as:  ZOLOFT Take 100 mg by mouth daily.   tamsulosin 0.4 MG Caps capsule Commonly known as:  FLOMAX Take 0.4 mg by mouth daily.   TOUJEO SOLOSTAR 300 UNIT/ML Sopn Generic drug:  Insulin Glargine Inject 10 Units into the skin at bedtime.       No orders of the defined types were placed in this encounter.   Immunization History  Administered Date(s) Administered  . PPD Test 05/12/2009    Social History  Substance Use Topics  . Smoking status: Never Smoker  . Smokeless tobacco: Never Used  . Alcohol use No    Review of Systems  DATA OBTAINED: from patient- pt never has any c/o GENERAL:  no fevers, fatigue, appetite  changes SKIN: No itching, rash HEENT: No complaint RESPIRATORY: No cough, wheezing, SOB CARDIAC: No chest pain, palpitations, lower extremity edema  GI: No abdominal pain, No N/V/D or constipation, No heartburn or reflux  GU: No dysuria, frequency or urgency, or incontinence  MUSCULOSKELETAL: No unrelieved bone/joint pain NEUROLOGIC: No headache, dizziness  PSYCHIATRIC: No overt anxiety or sadness  Vitals:   07/21/16 1145  BP: 106/71  Pulse: (!) 101  Resp: 16  Temp: 97.9 F (36.6 C)   Body mass index is 14.51 kg/m. Physical Exam  GENERAL APPEARANCE: Alert, minconversant, No acute distress  SKIN: No diaphoresis rash HEENT: Unremarkable RESPIRATORY: Breathing is even, unlabored. Lung sounds are clear   CARDIOVASCULAR: Heart RRR no murmurs, rubs or gallops. No peripheral edema  GASTROINTESTINAL: Abdomen is soft, non-tender, not distended w/ normal bowel sounds.  GENITOURINARY: Bladder non tender, not distended ;penis - dressed MUSCULOSKELETAL: severe wasting NEUROLOGIC: Cranial nerves 2-12 grossly intact. Moves all extremities PSYCHIATRIC: Mood and affect appropriate to situation, no behavioral issues  Patient Active Problem List   Diagnosis Date Noted  . FTT (failure to thrive) in adult 07/30/2016  . Left lower lobe pneumonia ([place]) 06/24/2016  . Acute respiratory failure with hypoxia ([employer] [place]) 06/24/2016  . Thrombocytopenia ([place]) 06/24/2016  . Coagulase negative Staphylococcus bacteremia   . Cachexia ([place])   . Pressure injury of skin 06/12/2016  . Sepsis ([place]) 06/11/2016  . Anemia of chronic disease 06/04/2016  . Severe sepsis ([place]) 05/01/2016  . AKI (acute kidney injury) ([place]) 05/01/2016  . CAD (coronary artery disease) 05/01/2016  . Osteomyelitis of ankle and foot ([place]) 03/25/2016  . Acute osteomyelitis of toe of right foot ([place]) 03/25/2016  . Leukocytosis 01/02/2016  . Anxiety state 07/02/2015  . Prostate cancer ([place]) 10/30/2014  . Fever presenting with conditions  classified elsewhere 10/04/2014  . Acute urinary retention 09/16/2014  . Taking medication for chronic disease 09/07/2014  . Leukocytosis, unspecified 03/03/2014  . Peripheral vascular disease ([place]) 02/25/2014  . Conjunctivitis 02/25/2014  . Itching 10/23/2013  . Depression 04/17/2013  . Pruritic disorder 03/11/2013  . Urinary tract infection 02/27/2013  . Pure hypercholesterolemia 11/29/2012  . 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 Hyperlipidemia LDL goal <70 11/11/2012  . Loss of weight 11/11/2012  . Essential hypertension, benign 11/11/2012  . Unspecified constipation 11/11/2012  . Type 2 diabetes with complication ([place]) 99991111  . Benign paroxysmal positional vertigo 11/11/2012  . CVA (cerebral vascular accident) ([place]) 11/11/2012  . Insomnia 11/11/2012  . GERD (gastroesophageal reflux disease) 11/11/2012    CMP  Component Value Date/Time   NA 140 06/16/2016 0609   NA 140 06/16/2016   K 3.6 06/16/2016 0609   CL 105 06/16/2016 0609   CO2 27 06/16/2016 0609   GLUCOSE 141 (H) 06/16/2016 0609   BUN 18 06/16/2016 0609   BUN 18 06/16/2016   CREATININE 0.68 06/16/2016 0609   CALCIUM 8.7 (L) 06/16/2016 0609   PROT 7.0 06/11/2016 1835   ALBUMIN 3.1 (L) 06/11/2016 1835   AST 18 06/11/2016 1835   ALT 10 (L) 06/11/2016 1835   ALKPHOS 97 06/11/2016 1835   BILITOT 0.7 06/11/2016 1835   GFRNONAA >60 06/16/2016 0609   GFRAA >60 06/16/2016 0609    Recent Labs  06/13/16 0023 06/14/16 06/14/16 1422 06/16/16 06/16/16 0609  NA 133* 137 137 140 140  K 3.8 3.7 3.7  --  3.6  CL 101  --  103  --  105  CO2 23  --  26  --  27  GLUCOSE 205*  --  187*  --  141*  BUN 28* 20 20 18 18   CREATININE 0.87 0.6 0.59* 0.7 0.68  CALCIUM 8.5*  --  8.3*  --  8.7*    Recent Labs  05/22/16 06/11/16 1243 06/11/16 1835  AST 13* 18 18  ALT 6* 9* 10*   ALKPHOS 84 97 97  BILITOT  --  0.8 0.7  PROT  --  6.7 7.0  ALBUMIN  --  3.1* 3.1*    Recent Labs  06/11/16 1243 06/11/16 1835  06/13/16 0023 06/14/16 06/14/16 1422 06/16/16 06/16/16 0609  WBC 22.9* 19.7  19.7*  < > 14.1* 6.3 6.3 7.3 7.3  NEUTROABS 19.3* 17.9*  --   --   --   --   --   --   HGB 11.3* 11.4*  11.4*  < > 9.5* 8.6* 8.6*  --  8.5*  HCT 35.6* 35.2*  35*  < > 29.5* 27* 26.8*  --  26.9*  MCV 84.2 82.4  --  81.7  --  83.2  --  83.3  PLT 292 241  241  < > 189 142* 142*  --  182  < > = values in this interval not displayed.  Recent Labs  09/29/15 12/30/15  CHOL 163 138  LDLCALC 104 76  TRIG 122 77   No results found for: [facility] Lab Results  Component Value Date   TSH 1.67 12/30/2015   Lab Results  Component Value Date   HGBA1C 5.3 12/30/2015   Lab Results  Component Value Date   CHOL 138 12/30/2015   HDL 47 12/30/2015   LDLCALC 76 12/30/2015   TRIG 77 12/30/2015    Significant Diagnostic Results in last 30 days:  No results found.  Assessment and Plan  FTT (failure to thrive) in adult Pt has been failing for months, but at very slow progression;pt stays in bed always, denies pain anywhere, never asks for anything even whenoffered specific things;cont supportive care  Depression No change in mood, not sad but not happy;plan to cont zoloft 100mg  qHS and welburein 150 mg daily  CAD (coronary artery disease) No episodes of CP or equivalent; cont plavix 75 mg daily    Torben Soloway D. Sheppard Coil, MD

## 2016-07-24 DEATH — deceased

## 2016-07-26 ENCOUNTER — Encounter: Payer: Self-pay | Admitting: Internal Medicine

## 2016-07-30 ENCOUNTER — Encounter: Payer: Self-pay | Admitting: Internal Medicine

## 2016-07-30 DIAGNOSIS — R627 Adult failure to thrive: Secondary | ICD-10-CM | POA: Insufficient documentation

## 2016-07-30 NOTE — Assessment & Plan Note (Signed)
No change in mood, not sad but not happy;plan to cont zoloft 100mg  qHS and welburein 150 mg daily

## 2016-07-30 NOTE — Assessment & Plan Note (Signed)
Pt has been failing for months, but at very slow progression;pt stays in bed always, denies pain anywhere, never asks for anything even whenoffered specific things;cont supportive care

## 2016-07-30 NOTE — Assessment & Plan Note (Signed)
No episodes of CP or equivalent; cont plavix 75 mg daily

## 2017-09-07 IMAGING — CT CT FOOT*R* W/CM
3 of 7 series · 5 of 36 positions shown, 6 images · IV contrast (iopamidol)
Comparison: MRI 04/27/2016

CLINICAL DATA: Right foot sores and erythema. Being treated for
sepsis. Evaluate for osteomyelitis. History of diabetes,
hypertension and left below the knee amputation.

EXAM:
CT OF THE RIGHT FOOT WITH CONTRAST
TECHNIQUE: Multidetector CT imaging was performed following the standard
protocol during bolus administration of intravenous contrast.
CONTRAST:  75mL OPQ2PK-YOO IOPAMIDOL (OPQ2PK-YOO) INJECTION 61%

[Series 207: sagittal bone · coronal · 0.25mm/px · 1 of 49 slices shown]
[im 25/49  bone]
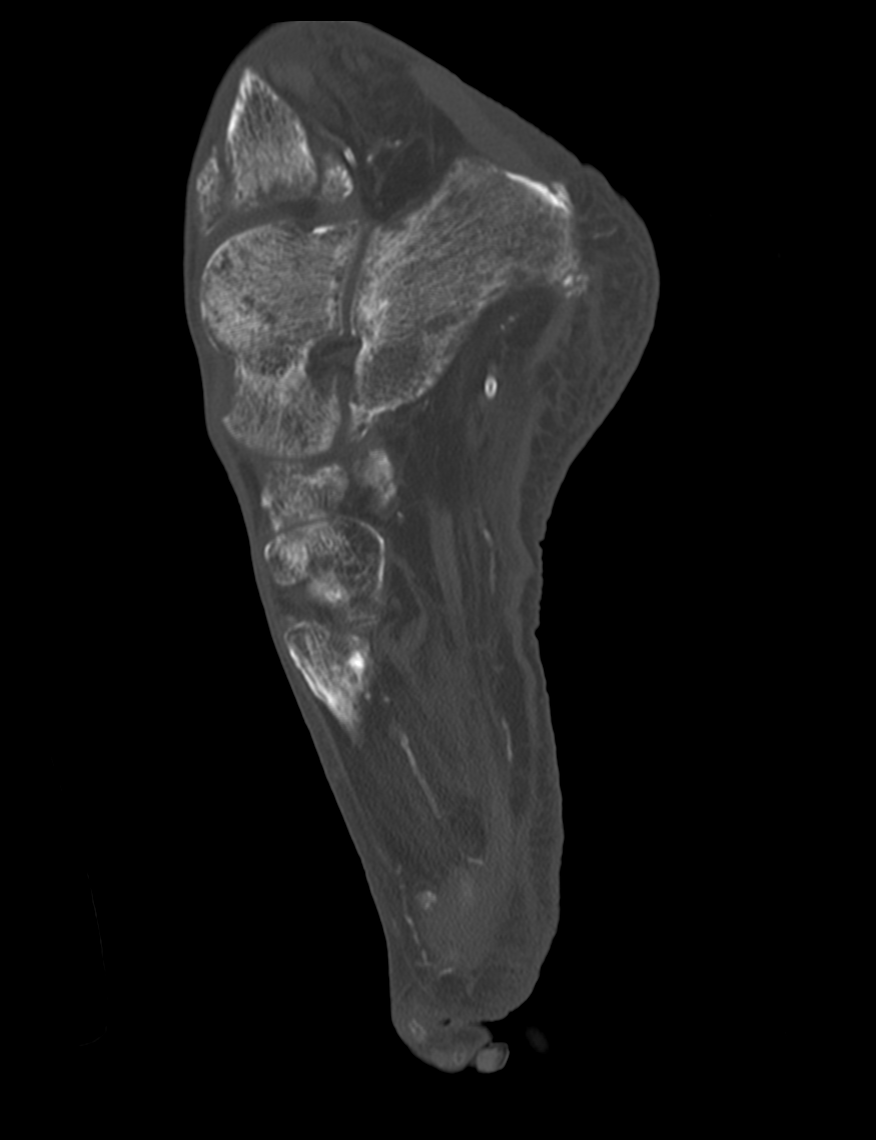

[Series 209: axial bone · axial · 0.25mm/px · z∈[-186,-76]mm · 3 of 127 slices shown, 4 images]
[im 32/127  soft-tissue]
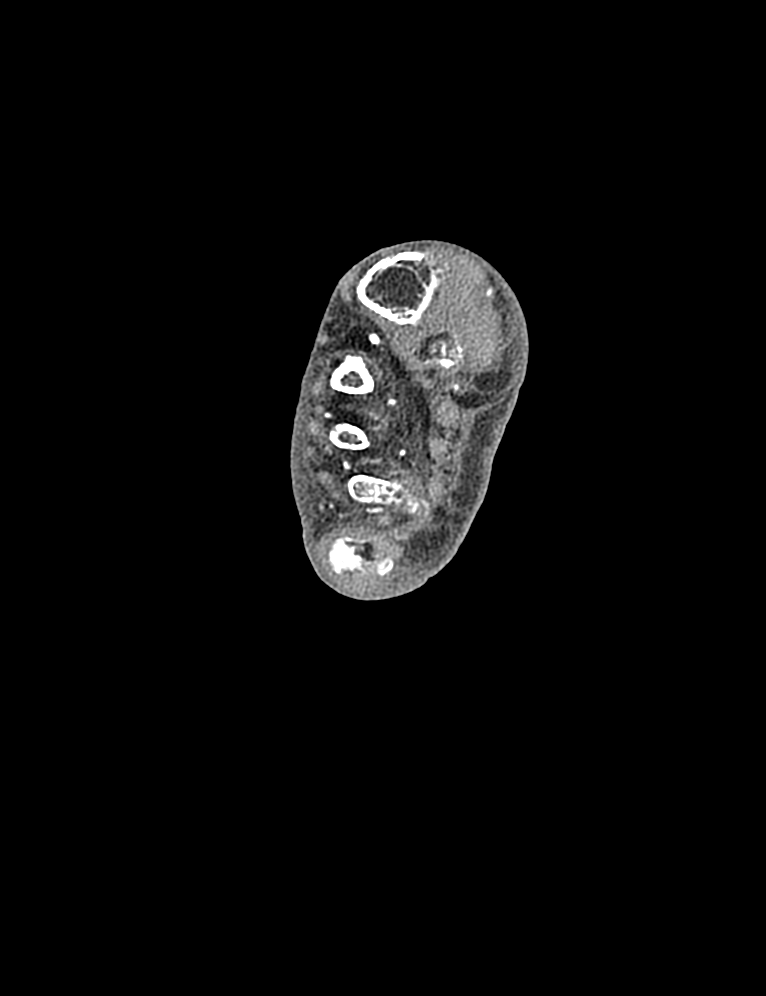
[im 32/127  bone]
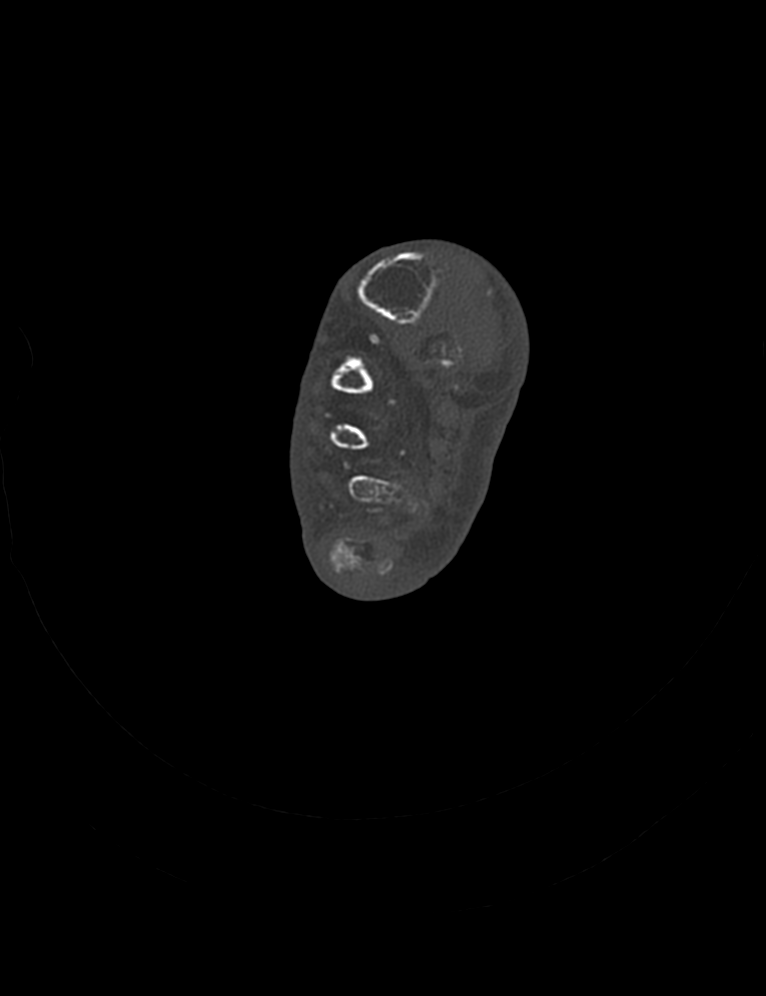
[im 64/127  bone]
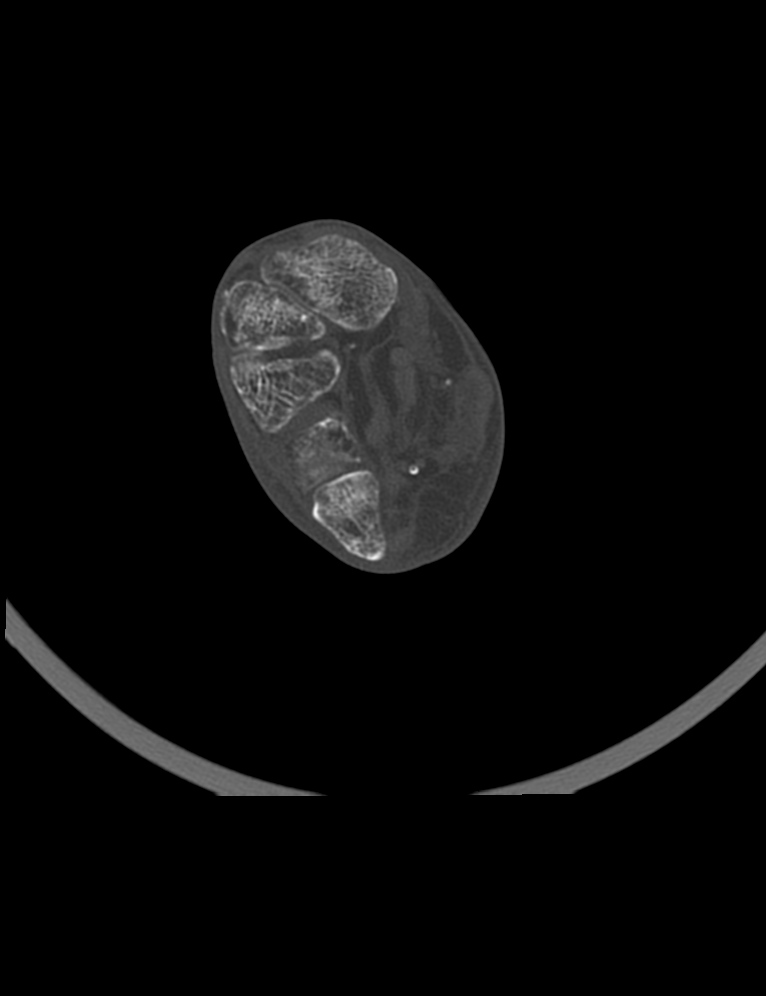
[im 95/127  bone]
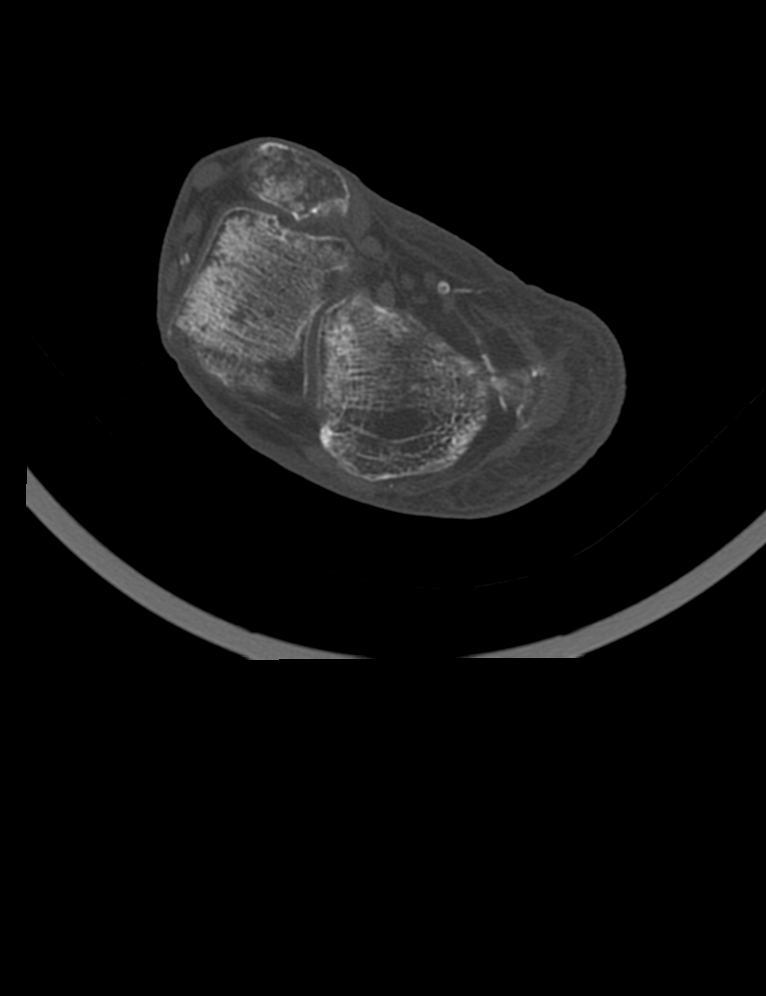

[Series 2010: axial st · axial · 0.25mm/px · 1 of 128 slices shown]
[im 32/128  bone]
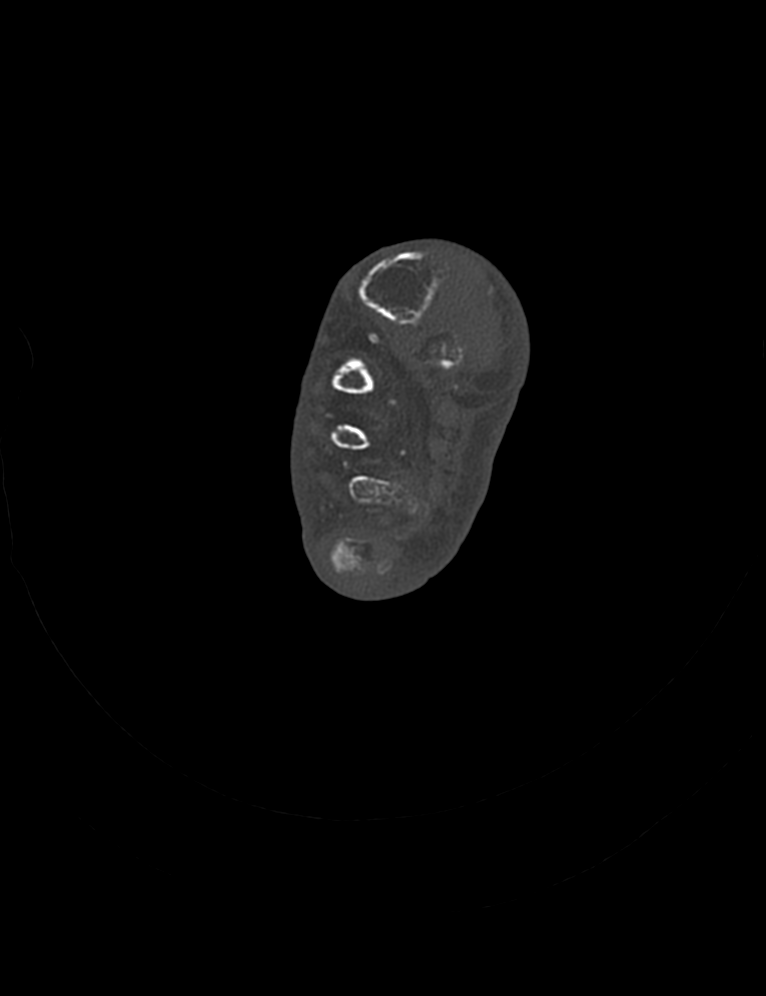

[5 of 36 positions shown; findings below may reference images not displayed]

FINDINGS: Bones/Joint/Cartilage

The bones are severely demineralized. There is no evidence of acute
fracture or dislocation. There is mild flattening and subchondral
sclerosis within the head of the second metatarsal, most consistent
with Ourari infraction. There are apparent erosive changes at the
third, fourth and fifth proximal interphalangeal joints, best seen
on the sagittal images. These are grossly stable from the prior MRI
and may be related to arthropathy or remote infection. No
progressive bone destruction identified to suggest osteomyelitis.
Mild degenerative changes are present at the first metatarsal
phalangeal joint. No significant joint effusions are seen.

Ligaments

Not relevant for exam/indication. The Lisfranc ligament appears
intact.

Muscles and Tendons
Mild muscular atrophy. No focal intramuscular fluid collection or
tendon abnormality identified.

Soft tissues
Mild generalized subcutaneous edema. No focal soft tissue
ulceration, fluid collection, emphysema or foreign body identified.
There are diffuse vascular calcifications consistent with diabetes.
IMPRESSION: 1. No evidence of progressive osteomyelitis.
2. The bones are diffusely demineralized. There are grossly stable
erosive changes at the third, fourth and fifth proximal
interphalangeal joints compared with previous MRI.
3. Mild generalized subcutaneous edema. No evidence of abscess or
myositis.
